# Patient Record
Sex: Female | Born: 1935 | Race: White | Hispanic: No | Marital: Single | Smoking: Former smoker
Health system: Southern US, Community
[De-identification: ages and names within clinical notes are randomized; demographics above are authoritative.]

## PROBLEM LIST (undated history)

## (undated) DIAGNOSIS — H5440 Blindness, one eye, unspecified eye: Secondary | ICD-10-CM

## (undated) DIAGNOSIS — I10 Essential (primary) hypertension: Secondary | ICD-10-CM

## (undated) DIAGNOSIS — E7800 Pure hypercholesterolemia, unspecified: Secondary | ICD-10-CM

## (undated) DIAGNOSIS — I4891 Unspecified atrial fibrillation: Secondary | ICD-10-CM

## (undated) DIAGNOSIS — F329 Major depressive disorder, single episode, unspecified: Secondary | ICD-10-CM

## (undated) DIAGNOSIS — I639 Cerebral infarction, unspecified: Secondary | ICD-10-CM

## (undated) DIAGNOSIS — L0390 Cellulitis, unspecified: Secondary | ICD-10-CM

## (undated) DIAGNOSIS — I509 Heart failure, unspecified: Secondary | ICD-10-CM

## (undated) DIAGNOSIS — F0390 Unspecified dementia without behavioral disturbance: Secondary | ICD-10-CM

## (undated) DIAGNOSIS — I499 Cardiac arrhythmia, unspecified: Secondary | ICD-10-CM

## (undated) DIAGNOSIS — F32A Depression, unspecified: Secondary | ICD-10-CM

## (undated) DIAGNOSIS — I4901 Ventricular fibrillation: Secondary | ICD-10-CM

## (undated) DIAGNOSIS — H409 Unspecified glaucoma: Secondary | ICD-10-CM

## (undated) DIAGNOSIS — J189 Pneumonia, unspecified organism: Secondary | ICD-10-CM

## (undated) HISTORY — DX: Unspecified atrial fibrillation: I48.91

---
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
## 2005-09-15 ENCOUNTER — Ambulatory Visit (HOSPITAL_COMMUNITY): Admission: RE | Admit: 2005-09-15 | Discharge: 2005-09-15 | Payer: Self-pay | Admitting: Radiology

## 2005-10-10 ENCOUNTER — Encounter: Admission: RE | Admit: 2005-10-10 | Discharge: 2005-10-10 | Payer: Self-pay | Admitting: Neurosurgery

## 2005-11-20 ENCOUNTER — Ambulatory Visit (HOSPITAL_COMMUNITY): Admission: RE | Admit: 2005-11-20 | Discharge: 2005-11-20 | Payer: Self-pay | Admitting: Neurosurgery

## 2010-06-09 ENCOUNTER — Encounter: Payer: Self-pay | Admitting: Neurosurgery

## 2010-07-30 ENCOUNTER — Ambulatory Visit (HOSPITAL_COMMUNITY)
Admission: RE | Admit: 2010-07-30 | Discharge: 2010-07-30 | Disposition: A | Discharge status: Home / Self Care | Payer: Medicare Other | Source: Ambulatory Visit | Attending: Ophthalmology | Admitting: Ophthalmology

## 2010-07-30 DIAGNOSIS — H349 Unspecified retinal vascular occlusion: Secondary | ICD-10-CM

## 2010-07-30 DIAGNOSIS — I10 Essential (primary) hypertension: Secondary | ICD-10-CM | POA: Insufficient documentation

## 2010-07-30 DIAGNOSIS — I059 Rheumatic mitral valve disease, unspecified: Secondary | ICD-10-CM | POA: Insufficient documentation

## 2010-07-30 DIAGNOSIS — H34239 Retinal artery branch occlusion, unspecified eye: Secondary | ICD-10-CM | POA: Insufficient documentation

## 2012-03-22 ENCOUNTER — Emergency Department (HOSPITAL_COMMUNITY): Payer: Medicare Other

## 2012-03-22 ENCOUNTER — Encounter (HOSPITAL_COMMUNITY): Payer: Self-pay | Admitting: *Deleted

## 2012-03-22 ENCOUNTER — Observation Stay (HOSPITAL_COMMUNITY)
Admission: EM | Admit: 2012-03-22 | Discharge: 2012-03-25 | Disposition: A | Discharge status: Home / Self Care | Payer: Medicare Other | Attending: Internal Medicine | Admitting: Internal Medicine

## 2012-03-22 DIAGNOSIS — I059 Rheumatic mitral valve disease, unspecified: Secondary | ICD-10-CM | POA: Insufficient documentation

## 2012-03-22 DIAGNOSIS — Z23 Encounter for immunization: Secondary | ICD-10-CM | POA: Insufficient documentation

## 2012-03-22 DIAGNOSIS — Z7982 Long term (current) use of aspirin: Secondary | ICD-10-CM | POA: Insufficient documentation

## 2012-03-22 DIAGNOSIS — I10 Essential (primary) hypertension: Secondary | ICD-10-CM

## 2012-03-22 DIAGNOSIS — Z7902 Long term (current) use of antithrombotics/antiplatelets: Secondary | ICD-10-CM | POA: Insufficient documentation

## 2012-03-22 DIAGNOSIS — M7989 Other specified soft tissue disorders: Secondary | ICD-10-CM | POA: Insufficient documentation

## 2012-03-22 DIAGNOSIS — R0902 Hypoxemia: Secondary | ICD-10-CM

## 2012-03-22 DIAGNOSIS — H409 Unspecified glaucoma: Secondary | ICD-10-CM

## 2012-03-22 DIAGNOSIS — I5033 Acute on chronic diastolic (congestive) heart failure: Principal | ICD-10-CM | POA: Insufficient documentation

## 2012-03-22 DIAGNOSIS — I509 Heart failure, unspecified: Secondary | ICD-10-CM | POA: Diagnosis present

## 2012-03-22 DIAGNOSIS — I4891 Unspecified atrial fibrillation: Secondary | ICD-10-CM

## 2012-03-22 DIAGNOSIS — Z792 Long term (current) use of antibiotics: Secondary | ICD-10-CM | POA: Insufficient documentation

## 2012-03-22 DIAGNOSIS — Z79899 Other long term (current) drug therapy: Secondary | ICD-10-CM | POA: Insufficient documentation

## 2012-03-22 HISTORY — DX: Essential (primary) hypertension: I10

## 2012-03-22 HISTORY — DX: Unspecified glaucoma: H40.9

## 2012-03-22 HISTORY — DX: Cardiac arrhythmia, unspecified: I49.9

## 2012-03-22 HISTORY — DX: Heart failure, unspecified: I50.9

## 2012-03-22 LAB — PRO B NATRIURETIC PEPTIDE: Pro B Natriuretic peptide (BNP): 2124 pg/mL — ABNORMAL HIGH (ref 0–450)

## 2012-03-22 LAB — CBC
Hemoglobin: 14.3 g/dL (ref 12.0–15.0)
MCH: 30.7 pg (ref 26.0–34.0)
Platelets: 243 10*3/uL (ref 150–400)
RDW: 14.4 % (ref 11.5–15.5)

## 2012-03-22 LAB — BASIC METABOLIC PANEL
CO2: 30 mEq/L (ref 19–32)
Calcium: 9.4 mg/dL (ref 8.4–10.5)
Chloride: 104 mEq/L (ref 96–112)

## 2012-03-22 LAB — TROPONIN I: Troponin I: <0.3 ng/mL (ref <0.30)

## 2012-03-22 MED ORDER — FUROSEMIDE 10 MG/ML IJ SOLN
20.0000 mg | Freq: Once | INTRAMUSCULAR | Status: AC
  Administered 2012-03-22: 20 mg via INTRAVENOUS
  Filled 2012-03-22: qty 2

## 2012-03-22 MED ORDER — DILTIAZEM HCL 30 MG PO TABS
30.0000 mg | ORAL_TABLET | Freq: Once | ORAL | Status: AC
  Administered 2012-03-23: 30 mg via ORAL
  Filled 2012-03-22: qty 1

## 2012-03-22 NOTE — ED Notes (Signed)
Pt wheeled to xray.

## 2012-03-22 NOTE — ED Provider Notes (Signed)
History     CSN: [id number]  Arrival date & time 03/22/12  1749   First MD Initiated Contact with Patient 03/22/12 1836      Chief Complaint  Patient presents with  . Shortness of Breath  . Leg Swelling    (Consider location/radiation/quality/duration/timing/severity/associated sxs/prior treatment) HPI Comments: Sydney Hensley is a 78 y.o. Female who has been ill for 2 weeks with cough and shortness of breath. Her doctor prescribed doxycycline. She took it but did not get better. She also has bilateral lower extremity swelling. She went to her Dr. today, and was discovered to be in atrial fibrillation, a new rhythm. She was sent here for evaluation. She denies chest pain, weakness, dizziness, nausea, and vomiting. She gets more out of breath with walking. Her breathing improves with sitting and rest. She's not taking any other medicine for the problem.  Patient is a 78 y.o. female presenting with shortness of breath. The history is provided by the patient and a relative.  Shortness of Breath  Associated symptoms include shortness of breath.    Past Medical History  Diagnosis Date  . Glaucoma     Past Surgical History  Procedure Date  . Abdominal hysterectomy     No family history on file.  History  Substance Use Topics  . Smoking status: Former Games developer  . Smokeless tobacco: Not on file  . Alcohol Use: No    OB History    Grav Para Term Preterm Abortions TAB SAB Ect Mult Living                  Review of Systems  Respiratory: Positive for shortness of breath.   All other systems reviewed and are negative.    Allergies  Review of patient's allergies indicates no known allergies.  Home Medications   Current Outpatient Rx  Name  Route  Sig  Dispense  Refill  . ASPIRIN 81 MG PO CHEW   Oral   Chew 81 mg by mouth daily.         . CHOLECALCIFEROL 2000 UNITS PO TABS   Oral   Take 1 tablet by mouth daily.         Marland Kitchen DOXYCYCLINE HYCLATE 100 MG PO CPEP  Oral   Take 100 mg by mouth 2 (two) times daily. Take for 7 days starting 03/16/12         . LISINOPRIL 5 MG PO TABS   Oral   Take 5 mg by mouth daily.         Marland Kitchen PRAVASTATIN SODIUM 20 MG PO TABS   Oral   Take 20 mg by mouth daily.         Marland Kitchen VITAMIN B-12 500 MCG PO TABS   Oral   Take 500 mcg by mouth daily.           BP 122/44  Pulse 87  Temp 98.3 F (36.8 C) (Oral)  Resp 22  SpO2 100%  Physical Exam  Nursing note and vitals reviewed. Constitutional: She is oriented to person, place, and time. She appears well-developed and well-nourished.  HENT:  Head: Normocephalic and atraumatic.  Eyes: Conjunctivae normal and EOM are normal. Pupils are equal, round, and reactive to light.  Neck: Normal range of motion and phonation normal. Neck supple.  Cardiovascular: Normal rate, regular rhythm and intact distal pulses.   Pulmonary/Chest: Effort normal and breath sounds normal. She has no wheezes. She has no rales. She exhibits no tenderness.  Abdominal: Soft.  She exhibits no distension. There is no tenderness. There is no guarding.  Musculoskeletal: Normal range of motion. She exhibits edema (3+ , bilateral legs).  Neurological: She is alert and oriented to person, place, and time. She has normal strength. She exhibits normal muscle tone.  Skin: Skin is warm and dry.  Psychiatric: She has a normal mood and affect. Her behavior is normal. Judgment and thought content normal.    ED Course  Procedures (including critical care time)  Emergency department treatment: IV Lasix for congestive heart failure. Oxygen by nasal cannula at 2 L to support oxygen deficit. O2 saturation improved to 100%  Case discussed with cardiology, Dr. Tresa Endo, who deferred admission to hospitalist.  21:40- patient walked to the bathroom x2. At this time. I return from the bathroom. Her heart rate was 123, and she was dyspneic; Oxygen saturation 100%, on nasal cannula oxygen. Her heart rate quickly  improved to less than 100 when she laid on the stretcher. Currently awaiting arrival of hospitalist to evaluate for admission.     CRITICAL CARE Performed by: Flint Melter   Total critical care time: 35 min  Critical care time was exclusive of separately billable procedures and treating other patients.  Critical care was necessary to treat or prevent imminent or life-threatening deterioration.  Critical care was time spent personally by me on the following activities: development of treatment plan with patient and/or surrogate as well as nursing, discussions with consultants, evaluation of patient's response to treatment, examination of patient, obtaining history from patient or surrogate, ordering and performing treatments and interventions, ordering and review of laboratory studies, ordering and review of radiographic studies, pulse oximetry and re-evaluation of patient's condition.  Labs Reviewed  PRO B NATRIURETIC PEPTIDE - Abnormal; Notable for the following:    Pro B Natriuretic peptide (BNP) 2124.0 (*)     All other components within normal limits  BASIC METABOLIC PANEL - Abnormal; Notable for the following:    Glucose, Bld 107 (*)     GFR calc non Af Amer 67 (*)     GFR calc Af Amer 77 (*)     All other components within normal limits  CBC  TROPONIN I   Dg Chest 2 View  03/22/2012  *RADIOLOGY REPORT*  Clinical Data: Shortness of breath  CHEST - 2 VIEW  Comparison: None.  Findings: There are small bilateral pleural effusions with consolidation of bilateral lung bases.  There is a small opacity in the right upper lobe, early developing pneumonia is possible.The mediastinal contour is normal.  Evaluation of heart size is limited due to loss of heart border.  There is minimal degenerative joint changes of the spine.  The soft tissues are otherwise normal.  IMPRESSION: Small bilateral pleural effusions with consolidation of bilateral lung bases at least in part due to atelectasis  but underlying pneumonia is not excluded.  There is a small opacity in the right upper lobe, early developing pneumonia is possible.   Original Report Authenticated By: Sherian Rein, M.D.      1. Congestive heart failure   2. Atrial fibrillation   3. Hypoxemia       MDM  New onset atrial fibrillation with congestive heart failure, mild. She has associated hypoxia, dyspnea on exertion, and marked leg edema. She is elderly and frail.     Consult [facility], who arranged admission for the patient       Flint Melter, MD 03/23/12 [phone]

## 2012-03-22 NOTE — ED Notes (Signed)
All triage by snewsome not darryl

## 2012-03-22 NOTE — ED Notes (Signed)
Pt returned from xray

## 2012-03-22 NOTE — ED Notes (Signed)
Pt sent from Dr. Waynard Edwards office with one week of sob, cough and was put on doxycycline.  COugh got better, but sob worse.  Pt has LE swelling.  Denies sob.  New onset afib with rate control in 80s and possible new chf.  MD perfers [facility] for cards consult since they saw her one year ago.

## 2012-03-22 NOTE — H&P (Addendum)
PCP:   Ezequiel Kayser, MD   Chief Complaint:  Shortness of breath and no extremity swelling  HPI: This is a pleasant 78 year old female who lives alone. She stated for approximately last 10 days and she's had a cold and a cough. She states the cough is nonproductive, she denies any wheezing, fevers, chills, nausea, vomiting or diarrhea. Over the past few days she's noted swelling on her bilateral lower extremities. She denies any chest pains, palpitation or lightheadedness. Her shortness of breath has become progressively worse and today she could hardly breathe. She called her PCP who sent her to the ER. Early in the week she had called her PCP and had antibiotics prescribed. She is taking 6 of the 7 days and feels no better. History provided the patient is alert and oriented.  Review of Systems:  The patient denies anorexia, fever, weight loss,, vision loss, decreased hearing, hoarseness, chest pain, syncope, balance deficits, hemoptysis, abdominal pain, melena, hematochezia, severe indigestion/heartburn, hematuria, incontinence, genital sores, muscle weakness, suspicious skin lesions, transient blindness, difficulty walking, depression, unusual weight change, abnormal bleeding, enlarged lymph nodes, angioedema, and breast masses.  Past Medical History: Past Medical History  Diagnosis Date  . Glaucoma    Past Surgical History  Procedure Date  . Abdominal hysterectomy     Medications: Prior to Admission medications   Medication Sig Start Date End Date Taking? Authorizing Provider  aspirin 81 MG chewable tablet Chew 81 mg by mouth daily.   Yes Historical Provider, MD  Cholecalciferol (VITAMIN D3 SUPER STRENGTH) 2000 UNITS TABS Take 1 tablet by mouth daily.   Yes Historical Provider, MD  doxycycline (DORYX) 100 MG DR capsule Take 100 mg by mouth 2 (two) times daily. Take for 7 days starting 03/16/12   Yes Historical Provider, MD  lisinopril (PRINIVIL,ZESTRIL) 5 MG tablet Take 5 mg by mouth  daily.   Yes Historical Provider, MD  pravastatin (PRAVACHOL) 20 MG tablet Take 20 mg by mouth daily.   Yes Historical Provider, MD  vitamin B-12 (CYANOCOBALAMIN) 500 MCG tablet Take 500 mcg by mouth daily.   Yes Historical Provider, MD    Allergies:  No Known Allergies  Social History:  reports that she has quit smoking. She does not have any smokeless tobacco history on file. She reports that she does not drink alcohol or use illicit drugs. lives alone, doesn't use a cane or walker.  Family History: Family History  Problem Relation Age of Onset  . CVA      Physical Exam: Filed Vitals:   03/22/12 1837 03/22/12 1937 03/22/12 2100 03/22/12 2259  BP:   155/96 166/103  Pulse:   79   Temp:      TempSrc:      Resp:   19 18  SpO2: 98% 100% 100% 98%    General:  Alert and oriented times three, well developed and nourished, no acute distress Eyes: PERRLA, pink conjunctiva, no scleral icterus ENT: Moist oral mucosa, neck supple, no thyromegaly Lungs: clear to ascultation, no wheeze, no crackles, no use of accessory muscles Cardiovascular: irregular rate and rhythm, no regurgitation, no gallops, no murmurs. No carotid bruits, no JVD Abdomen: soft, positive BS, non-tender, non-distended, no organomegaly, not an acute abdomen GU: not examined Neuro: CN II - XII grossly intact, sensation intact Musculoskeletal: strength 5/5 all extremities, no clubbing, cyanosis or 3+ bilateral lower extremity pitting edema Skin: no rash, no subcutaneous crepitation, no decubitus Psych: appropriate patient   Labs on Admission:   [facility] 03/22/12 1817  NA 143  K 4.1  CL 104  CO2 30  GLUCOSE 107*  BUN 19  CREATININE 0.80  CALCIUM 9.4  MG --  PHOS --   No results found for this basename: AST:2,ALT:2,ALKPHOS:2,BILITOT:2,PROT:2,ALBUMIN:2 in the last 72 hours No results found for this basename: LIPASE:2,AMYLASE:2 in the last 72 hours  Basename 03/22/12 1817  WBC 8.8  NEUTROABS --  HGB 14.3    HCT 42.7  MCV 91.6  PLT 243    Basename 03/22/12 1817  CKTOTAL --  CKMB --  CKMBINDEX --  TROPONINI <0.30   No components found with this basename: POCBNP:3 No results found for this basename: DDIMER:2 in the last 72 hours No results found for this basename: HGBA1C:2 in the last 72 hours No results found for this basename: CHOL:2,HDL:2,LDLCALC:2,TRIG:2,CHOLHDL:2,LDLDIRECT:2 in the last 72 hours No results found for this basename: TSH,T4TOTAL,FREET3,T3FREE,THYROIDAB in the last 72 hours No results found for this basename: VITAMINB12:2,FOLATE:2,FERRITIN:2,TIBC:2,IRON:2,RETICCTPCT:2 in the last 72 hours  Micro Results: No results found for this or any previous visit (from the past 240 hour(s)).   Radiological Exams on Admission: Dg Chest 2 View  03/22/2012  *RADIOLOGY REPORT*  Clinical Data: Shortness of breath  CHEST - 2 VIEW  Comparison: None.  Findings: There are small bilateral pleural effusions with consolidation of bilateral lung bases.  There is a small opacity in the right upper lobe, early developing pneumonia is possible.The mediastinal contour is normal.  Evaluation of heart size is limited due to loss of heart border.  There is minimal degenerative joint changes of the spine.  The soft tissues are otherwise normal.  IMPRESSION: Small bilateral pleural effusions with consolidation of bilateral lung bases at least in part due to atelectasis but underlying pneumonia is not excluded.  There is a small opacity in the right upper lobe, early developing pneumonia is possible.   Original Report Authenticated By: Sherian Rein, M.D.    EKG: Atrial relation  Assessment/Plan Present on Admission:  . Congestive heart failure Atrial fibrillation Admit to telemetry for observation TSH, 2-D echo in a.m. Lasix IV every 12, [facility] Coumadin pharmacy to manage Oxygen ketones greater than 88%   Full code DVT prophylaxis   Sydney Hensley 03/22/2012, 11:32 PM

## 2012-03-23 ENCOUNTER — Encounter (HOSPITAL_COMMUNITY): Payer: Self-pay

## 2012-03-23 DIAGNOSIS — I10 Essential (primary) hypertension: Secondary | ICD-10-CM | POA: Diagnosis present

## 2012-03-23 LAB — URINALYSIS, ROUTINE W REFLEX MICROSCOPIC
Glucose, UA: NEGATIVE mg/dL
Hgb urine dipstick: NEGATIVE
Ketones, ur: NEGATIVE mg/dL
Protein, ur: NEGATIVE mg/dL
pH: 7.5 (ref 5.0–8.0)

## 2012-03-23 LAB — BASIC METABOLIC PANEL
CO2: 28 mEq/L (ref 19–32)
Calcium: 9 mg/dL (ref 8.4–10.5)
Chloride: 105 mEq/L (ref 96–112)
Glucose, Bld: 87 mg/dL (ref 70–99)
Sodium: 145 mEq/L (ref 135–145)

## 2012-03-23 LAB — CBC
Hemoglobin: 13.6 g/dL (ref 12.0–15.0)
MCH: 30.3 pg (ref 26.0–34.0)
MCV: 92.4 fL (ref 78.0–100.0)
RBC: 4.49 MIL/uL (ref 3.87–5.11)
WBC: 7.6 10*3/uL (ref 4.0–10.5)

## 2012-03-23 LAB — PROTIME-INR: INR: 0.97 (ref 0.00–1.49)

## 2012-03-23 MED ORDER — WARFARIN - PHARMACIST DOSING INPATIENT
Freq: Every day | Status: DC
  Administered 2012-03-23: 18:00:00

## 2012-03-23 MED ORDER — METOPROLOL TARTRATE 1 MG/ML IV SOLN: 5.0000 mg | Freq: Four times a day (QID) | INTRAVENOUS | Status: DC | PRN

## 2012-03-23 MED ORDER — SODIUM CHLORIDE 0.9 % IJ SOLN: 3.0000 mL | INTRAMUSCULAR | Status: DC | PRN

## 2012-03-23 MED ORDER — SODIUM CHLORIDE 0.9 % IJ SOLN
3.0000 mL | Freq: Two times a day (BID) | INTRAMUSCULAR | Status: DC
  Administered 2012-03-23 – 2012-03-25 (×6): 3 mL via INTRAVENOUS

## 2012-03-23 MED ORDER — COUMADIN BOOK
1.0000 | Freq: Once | Status: AC
  Administered 2012-03-23: 1
  Filled 2012-03-23 (×2): qty 1

## 2012-03-23 MED ORDER — PNEUMOCOCCAL VAC POLYVALENT 25 MCG/0.5ML IJ INJ
0.5000 mL | INJECTION | INTRAMUSCULAR | Status: AC
  Administered 2012-03-24: 0.5 mL via INTRAMUSCULAR
  Filled 2012-03-23: qty 0.5

## 2012-03-23 MED ORDER — INFLUENZA VIRUS VACC SPLIT PF IM SUSP
0.5000 mL | INTRAMUSCULAR | Status: AC
  Administered 2012-03-24: 0.5 mL via INTRAMUSCULAR
  Filled 2012-03-23: qty 0.5

## 2012-03-23 MED ORDER — ONDANSETRON HCL 4 MG/2ML IJ SOLN: 4.0000 mg | Freq: Four times a day (QID) | INTRAMUSCULAR | Status: DC | PRN

## 2012-03-23 MED ORDER — SENNOSIDES-DOCUSATE SODIUM 8.6-50 MG PO TABS
1.0000 | ORAL_TABLET | Freq: Every evening | ORAL | Status: DC | PRN
  Filled 2012-03-23: qty 1

## 2012-03-23 MED ORDER — HYDROCODONE-ACETAMINOPHEN 5-325 MG PO TABS: 1.0000 | ORAL_TABLET | ORAL | Status: DC | PRN

## 2012-03-23 MED ORDER — ENOXAPARIN SODIUM 60 MG/0.6ML ~~LOC~~ SOLN
1.0000 mg/kg | Freq: Two times a day (BID) | SUBCUTANEOUS | Status: DC
  Administered 2012-03-23 – 2012-03-24 (×3): 60 mg via SUBCUTANEOUS
  Filled 2012-03-23 (×5): qty 0.6

## 2012-03-23 MED ORDER — WARFARIN SODIUM 5 MG PO TABS
5.0000 mg | ORAL_TABLET | Freq: Once | ORAL | Status: AC
  Administered 2012-03-23: 5 mg via ORAL
  Filled 2012-03-23: qty 1

## 2012-03-23 MED ORDER — LISINOPRIL 5 MG PO TABS
5.0000 mg | ORAL_TABLET | Freq: Every day | ORAL | Status: DC
  Administered 2012-03-23 – 2012-03-25 (×3): 5 mg via ORAL
  Filled 2012-03-23 (×3): qty 1

## 2012-03-23 MED ORDER — WARFARIN VIDEO
1.0000 | Freq: Once | Status: AC
  Administered 2012-03-23: 1

## 2012-03-23 MED ORDER — FUROSEMIDE 10 MG/ML IJ SOLN
40.0000 mg | Freq: Two times a day (BID) | INTRAMUSCULAR | Status: DC
  Administered 2012-03-23 – 2012-03-25 (×6): 40 mg via INTRAVENOUS
  Filled 2012-03-23 (×8): qty 4

## 2012-03-23 MED ORDER — DILTIAZEM HCL 30 MG PO TABS
30.0000 mg | ORAL_TABLET | Freq: Four times a day (QID) | ORAL | Status: DC
  Administered 2012-03-23 – 2012-03-24 (×5): 30 mg via ORAL
  Filled 2012-03-23 (×9): qty 1

## 2012-03-23 MED ORDER — ENOXAPARIN SODIUM 40 MG/0.4ML ~~LOC~~ SOLN: 40.0000 mg | SUBCUTANEOUS | Status: DC

## 2012-03-23 MED ORDER — ONDANSETRON HCL 4 MG PO TABS: 4.0000 mg | ORAL_TABLET | Freq: Four times a day (QID) | ORAL | Status: DC | PRN

## 2012-03-23 MED ORDER — ACETAMINOPHEN 325 MG PO TABS: 650.0000 mg | ORAL_TABLET | Freq: Four times a day (QID) | ORAL | Status: DC | PRN

## 2012-03-23 MED ORDER — ASPIRIN 81 MG PO CHEW
81.0000 mg | CHEWABLE_TABLET | Freq: Every day | ORAL | Status: DC
  Administered 2012-03-23: 81 mg via ORAL
  Filled 2012-03-23: qty 1

## 2012-03-23 MED ORDER — ACETAMINOPHEN 650 MG RE SUPP: 650.0000 mg | Freq: Four times a day (QID) | RECTAL | Status: DC | PRN

## 2012-03-23 MED ORDER — POTASSIUM CHLORIDE CRYS ER 20 MEQ PO TBCR
40.0000 meq | EXTENDED_RELEASE_TABLET | Freq: Once | ORAL | Status: AC
  Administered 2012-03-23: 40 meq via ORAL
  Filled 2012-03-23: qty 2

## 2012-03-23 NOTE — Progress Notes (Signed)
Utilization Review Completed.   Korey Arroyo, RN, BSN Nurse Case Manager  [phone]  

## 2012-03-23 NOTE — Progress Notes (Signed)
  Echocardiogram 2D Echocardiogram has been performed.  Georgian Co 03/23/2012, 4:32 PM

## 2012-03-23 NOTE — Progress Notes (Signed)
ANTICOAGULATION CONSULT NOTE - Initial Consult  Pharmacy Consult for Coumadin Indication: atrial fibrillation  No Known Allergies  Patient Measurements: Height: 5\' 6"  (167.6 cm) Weight: 136 lb 11.2 oz (62.007 kg) (c scale) IBW/kg (Calculated) : 59.3   Vital Signs: Temp: 97.5 F (36.4 C) (11/05 0024) Temp src: Oral (11/05 0024) BP: 169/96 mmHg (11/05 0024) Pulse Rate: 110  (11/05 0024)  Labs:  Basename 03/23/12 0302 03/22/12 1817  HGB 13.6 14.3  HCT 41.5 42.7  PLT 222 243  APTT -- --  LABPROT -- --  INR -- --  HEPARINUNFRC -- --  CREATININE -- 0.80  CKTOTAL -- --  CKMB -- --  TROPONINI -- <0.30    Estimated Creatinine Clearance: 50.8 ml/min (by C-G formula based on Cr of 0.8).   Medical History: Past Medical History  Diagnosis Date  . Glaucoma   . Hypertension   . Dysrhythmia   . Shortness of breath   . CHF (congestive heart failure)     Medications:  Scheduled:    . aspirin  81 mg Oral Daily  . diltiazem  30 mg Oral Q6H  . [COMPLETED] diltiazem  30 mg Oral Once  . enoxaparin (LOVENOX) injection  40 mg Subcutaneous Q24H  . [COMPLETED] furosemide  20 mg Intravenous Once  . furosemide  40 mg Intravenous Q12H  . lisinopril  5 mg Oral Daily  . sodium chloride  3 mL Intravenous Q12H   Assessment: 78 yo female admitted with new-onset atrial fibrillation. Pharmacy consulted to manage Coumadin. Patient is also to receive full-dose therapeutic Lovenox per physician order.   Goal of Therapy:  INR 2-3 Monitor platelets by anticoagulation protocol: Yes   Plan:  1. Coumadin 5mg  po today at 18:00. 2. Daily PT / INR 3. Coumadin book / video.  4. Lovenox 60mg  SQ Q12H per physician order.   Emeline Gins 03/23/2012,3:24 AM

## 2012-03-23 NOTE — Progress Notes (Signed)
Pt with 2.2 second pause pt asymtomatic placed a call to Dr. Starr Sinclair to relay

## 2012-03-23 NOTE — Progress Notes (Addendum)
Monitor tech notified RN that pt had 5 beat run of V-tach. Pt asymptomatic. BP 141/83, HR 83. MD notified via [facility] text page. Will continue to monitor and assess. Jobe Igo A RN, 03/23/2012 11:05 PM

## 2012-03-23 NOTE — Progress Notes (Signed)
Pt noted having a soft raised area to right upper back 9.5cm x 7.2cm. Pt stated she didn't know anything about it being back there. Also pt has voiced concerns about being a DNR.

## 2012-03-23 NOTE — Progress Notes (Signed)
[facility] PROGRESS NOTE  Sydney Hensley NWG:[id number] DOB: [birth year] DOA: 03/22/2012 PCP: Ezequiel Kayser, MD  Assessment/Plan: Congestive heart failure -Improving,continue diuresis with iv lasix for now, will change to by mouth when appropriate  -Await 2-D echo Atrial fibrillation  -rate controlled on oral Cardizem -on Coumadin/lovenox per pharmacy, INR subtherapeutic Hypertension -Continue Cardizem and lisinopril, follow and adjust as appropriate. Hypokalemia -Replace potassium  Code Status: Full code Family Communication: The nephew at bedside Disposition Plan: to home when medically ready   Consultants:  None  Procedures:  Echo-pending  Antibiotics:  None  HPI/Subjective: States breathing much better today, denies chest pain.  Objective: Filed Vitals:   03/22/12 2100 03/22/12 2259 03/23/12 0024 03/23/12 1142  BP: 155/96 166/103 169/96 151/70  Pulse: 79  110   Temp:   97.5 F (36.4 C)   TempSrc:   Oral   Resp: 19 18 20    Height:   5\' 6"  (1.676 m)   Weight:   62.007 kg (136 lb 11.2 oz)   SpO2: 100% 98% 99%     Intake/Output Summary (Last 24 hours) at 03/23/12 1200 Last data filed at 03/23/12 1144  Gross per 24 hour  Intake    243 ml  Output   4000 ml  Net  -3757 ml   Filed Weights   03/23/12 0024  Weight: 62.007 kg (136 lb 11.2 oz)    Exam:   General: Alert and appropriate, in no apparent distress.   Cardiovascular: Irregular, normal S1-S2, rate controlled.  Respiratory: Decreased breath sounds at the bases, no crackles or wheezes  Abdomen: Soft, bowel sounds present nontender nondistended no organomegaly and no masses palpable  Extremities: Decreased edema, no cyanosis.  Data Reviewed: Basic Metabolic Panel:  Lab 03/23/12 5784 03/22/12 1817  NA 145 143  K 3.4* 4.1  CL 105 104  CO2 28 30  GLUCOSE 87 107*  BUN 17 19  CREATININE 0.75 0.80  CALCIUM 9.0 9.4  MG -- --  PHOS -- --   Liver Function Tests: No results found  for this basename: AST:5,ALT:5,ALKPHOS:5,BILITOT:5,PROT:5,ALBUMIN:5 in the last 168 hours No results found for this basename: LIPASE:5,AMYLASE:5 in the last 168 hours No results found for this basename: AMMONIA:5 in the last 168 hours CBC:  Lab 03/23/12 0302 03/22/12 1817  WBC 7.6 8.8  NEUTROABS -- --  HGB 13.6 14.3  HCT 41.5 42.7  MCV 92.4 91.6  PLT 222 243   Cardiac Enzymes:  Lab 03/22/12 1817  CKTOTAL --  CKMB --  CKMBINDEX --  TROPONINI <0.30   BNP (last 3 results)  Basename 03/23/12 0302 03/22/12 1817  PROBNP 2337.0* 2124.0*   CBG: No results found for this basename: GLUCAP:5 in the last 168 hours  No results found for this or any previous visit (from the past 240 hour(s)).   Studies: Dg Chest 2 View  03/22/2012  *RADIOLOGY REPORT*  Clinical Data: Shortness of breath  CHEST - 2 VIEW  Comparison: None.  Findings: There are small bilateral pleural effusions with consolidation of bilateral lung bases.  There is a small opacity in the right upper lobe, early developing pneumonia is possible.The mediastinal contour is normal.  Evaluation of heart size is limited due to loss of heart border.  There is minimal degenerative joint changes of the spine.  The soft tissues are otherwise normal.  IMPRESSION: Small bilateral pleural effusions with consolidation of bilateral lung bases at least in part due to atelectasis but underlying pneumonia is not excluded.  There is  a small opacity in the right upper lobe, early developing pneumonia is possible.   Original Report Authenticated By: Sherian Rein, M.D.     Scheduled Meds:   . aspirin  81 mg Oral Daily  . coumadin book  1 each Does not apply Once  . diltiazem  30 mg Oral Q6H  . [COMPLETED] diltiazem  30 mg Oral Once  . enoxaparin (LOVENOX) injection  1 mg/kg Subcutaneous Q12H  . [COMPLETED] furosemide  20 mg Intravenous Once  . furosemide  40 mg Intravenous Q12H  . influenza  inactive virus vaccine  0.5 mL Intramuscular  Tomorrow-1000  . lisinopril  5 mg Oral Daily  . pneumococcal 23 valent vaccine  0.5 mL Intramuscular Tomorrow-1000  . sodium chloride  3 mL Intravenous Q12H  . warfarin  5 mg Oral ONCE-1800  . warfarin  1 each Does not apply Once  . Warfarin - Pharmacist Dosing Inpatient   Does not apply q1800  . [DISCONTINUED] enoxaparin (LOVENOX) injection  40 mg Subcutaneous Q24H   Continuous Infusions:   Active Problems:  Congestive heart failure  Atrial fibrillation  Glaucoma    Time spent:    [facility]  [facility] Pager [phone]. If 8PM-8AM, please contact night-coverage at [email or web address], password [facility] 03/23/2012, 12:00 PM  LOS: 1 day

## 2012-03-24 DIAGNOSIS — R0902 Hypoxemia: Secondary | ICD-10-CM

## 2012-03-24 LAB — BASIC METABOLIC PANEL
BUN: 18 mg/dL (ref 6–23)
Calcium: 9.5 mg/dL (ref 8.4–10.5)
Creatinine, Ser: 0.79 mg/dL (ref 0.50–1.10)
GFR calc non Af Amer: 75 mL/min — ABNORMAL LOW (ref >90)
Glucose, Bld: 87 mg/dL (ref 70–99)

## 2012-03-24 MED ORDER — ASPIRIN 81 MG PO CHEW
CHEWABLE_TABLET | ORAL | Status: AC
  Administered 2012-03-24: 12:00:00
  Filled 2012-03-24: qty 1

## 2012-03-24 MED ORDER — POTASSIUM CHLORIDE CRYS ER 20 MEQ PO TBCR
40.0000 meq | EXTENDED_RELEASE_TABLET | Freq: Once | ORAL | Status: AC
  Administered 2012-03-24: 40 meq via ORAL
  Filled 2012-03-24: qty 2

## 2012-03-24 MED ORDER — METOPROLOL TARTRATE 50 MG PO TABS
50.0000 mg | ORAL_TABLET | Freq: Two times a day (BID) | ORAL | Status: DC
  Administered 2012-03-24 – 2012-03-25 (×3): 50 mg via ORAL
  Filled 2012-03-24 (×4): qty 1

## 2012-03-24 MED ORDER — APIXABAN 5 MG PO TABS
2.5000 mg | ORAL_TABLET | Freq: Two times a day (BID) | ORAL | Status: DC
  Administered 2012-03-24 – 2012-03-25 (×2): 2.5 mg via ORAL
  Filled 2012-03-24 (×4): qty 0.5

## 2012-03-24 NOTE — Progress Notes (Signed)
Pt weight yesterday 11/6 using scale C was 60.002kg. Pt on IV lasix and put out in urine last night. This AM weight post-voiding, weighed twice on scale C is 55.4kg. Will continue to monitor and assess. Baron Hamper RN 03/24/2012 6:52 AM

## 2012-03-24 NOTE — Progress Notes (Signed)
Monitor tech notified RN that pt had 2.39 second pause in HR on the monitor. MD notified through [facility] text page. Will continue to monitor and assess. Baron Hamper RN 03/24/2012 2:51 AM

## 2012-03-24 NOTE — Progress Notes (Addendum)
Pt refuses to call nurse to go to the bathroom. RN noticed pt walking to bathroom holding gown. Pt appeared confused. Upon entering the room, pt was in the bathroom. Pt had incontinent episode of bowel on the floor in room. RN assisted pt back to bed and put in bed with bed alarm on. Pt alert and oriented x4. No other neuro symptoms. Pt re-instructed to call for help to the bathroom. Floor cleaned with orange wipes and housekeeping called. Will continue to monitor and assess. Jobe Igo A RN 03/24/2012

## 2012-03-24 NOTE — Plan of Care (Signed)
Problem: Phase I Progression Outcomes Goal: EF % per last Echo/documented,Core Reminder form on chart Outcome: Completed/Met Date Met:  03/24/12 Pt EF 50-55% per Echo on 03/23/12

## 2012-03-24 NOTE — Progress Notes (Signed)
[facility] PROGRESS NOTE  Sydney Hensley WUJ:[id number] DOB: [birth year] DOA: 03/22/2012 PCP: Sydney Kayser, MD  Assessment/Plan: Congestive heart failure -Improving,continue diuresis with iv lasix for now, will change to by mouth when appropriate  -Await 2-D echo to find out type.  Atrial fibrillation  -rate controlled on oral Metoprolol -start  Eliquis for full anticoagulation  Hypertension -Continue metoprolol and lisinopril, follow and adjust as appropriate. Hypokalemia -Replace potassium  Code Status: Full code Family Communication: The nephew at bedside Disposition Plan: to home when medically ready   Consultants:  None  Procedures:  Echo-pending  Antibiotics:  None  HPI/Subjective: States breathing much better today, denies chest pain.  Objective: Filed Vitals:   03/24/12 0001 03/24/12 0233 03/24/12 0535 03/24/12 0644  BP:  117/51 137/67   Pulse: 74 81 79   Temp:  97.7 F (36.5 C) 97.7 F (36.5 C)   TempSrc:  Oral Oral   Resp:  16 18   Height:      Weight:    55.4 kg (122 lb 2.2 oz)  SpO2:  97% 96%     Intake/Output Summary (Last 24 hours) at 03/24/12 0957 Last data filed at 03/24/12 0835  Gross per 24 hour  Intake    855 ml  Output   4150 ml  Net  -3295 ml   Filed Weights   03/23/12 0024 03/24/12 0644  Weight: 62.007 kg (136 lb 11.2 oz) 55.4 kg (122 lb 2.2 oz)    Exam:   General: Alert and appropriate, in no apparent distress.   Cardiovascular: Irregular, normal S1-S2, rate controlled.  Respiratory: Decreased breath sounds at the bases, no crackles or wheezes  Abdomen: Soft, bowel sounds present nontender nondistended no organomegaly and no masses palpable  Extremities: Decreased edema, no cyanosis.  Data Reviewed: Basic Metabolic Panel:  Lab 03/24/12 9562 03/23/12 0302 03/22/12 1817  NA 143 145 143  K 3.5 3.4* 4.1  CL 99 105 104  CO2 32 28 30  GLUCOSE 87 87 107*  BUN 18 17 19   CREATININE 0.79 0.75 0.80  CALCIUM 9.5  9.0 9.4  MG -- -- --  PHOS -- -- --   Liver Function Tests: No results found for this basename: AST:5,ALT:5,ALKPHOS:5,BILITOT:5,PROT:5,ALBUMIN:5 in the last 168 hours No results found for this basename: LIPASE:5,AMYLASE:5 in the last 168 hours No results found for this basename: AMMONIA:5 in the last 168 hours CBC:  Lab 03/23/12 0302 03/22/12 1817  WBC 7.6 8.8  NEUTROABS -- --  HGB 13.6 14.3  HCT 41.5 42.7  MCV 92.4 91.6  PLT 222 243   Cardiac Enzymes:  Lab 03/22/12 1817  CKTOTAL --  CKMB --  CKMBINDEX --  TROPONINI <0.30   BNP (last 3 results)  Basename 03/23/12 0302 03/22/12 1817  PROBNP 2337.0* 2124.0*   CBG: No results found for this basename: GLUCAP:5 in the last 168 hours  No results found for this or any previous visit (from the past 240 hour(s)).   Studies: Dg Chest 2 View  03/22/2012  *RADIOLOGY REPORT*  Clinical Data: Shortness of breath  CHEST - 2 VIEW  Comparison: None.  Findings: There are small bilateral pleural effusions with consolidation of bilateral lung bases.  There is a small opacity in the right upper lobe, early developing pneumonia is possible.The mediastinal contour is normal.  Evaluation of heart size is limited due to loss of heart border.  There is minimal degenerative joint changes of the spine.  The soft tissues are otherwise normal.  IMPRESSION:  Small bilateral pleural effusions with consolidation of bilateral lung bases at least in part due to atelectasis but underlying pneumonia is not excluded.  There is a small opacity in the right upper lobe, early developing pneumonia is possible.   Original Report Authenticated By: Sherian Rein, M.D.     Scheduled Meds:    . aspirin  81 mg Oral Daily  . [COMPLETED] coumadin book  1 each Does not apply Once  . diltiazem  30 mg Oral Q6H  . enoxaparin (LOVENOX) injection  1 mg/kg Subcutaneous Q12H  . furosemide  40 mg Intravenous Q12H  . influenza  inactive virus vaccine  0.5 mL Intramuscular  Tomorrow-1000  . lisinopril  5 mg Oral Daily  . pneumococcal 23 valent vaccine  0.5 mL Intramuscular Tomorrow-1000  . [COMPLETED] potassium chloride  40 mEq Oral Once  . potassium chloride  40 mEq Oral Once  . sodium chloride  3 mL Intravenous Q12H  . [COMPLETED] warfarin  5 mg Oral ONCE-1800  . [COMPLETED] warfarin  1 each Does not apply Once  . Warfarin - Pharmacist Dosing Inpatient   Does not apply q1800   Continuous Infusions:   Active Problems:  Congestive heart failure  Atrial fibrillation  Glaucoma  Hypertension    Time spent:    Sydney Hensley  [facility] Pager [phone]. If 8PM-8AM, please contact night-coverage at [email or web address], password [facility] 03/24/2012, 9:57 AM  LOS: 2 days

## 2012-03-24 NOTE — Evaluation (Signed)
Physical Therapy Evaluation Patient Details Name: Sydney Hensley MRN: [id number] DOB: 1927/10/15 Today's Date: 03/24/2012 Time: 4098-1191 PT Time Calculation (min): 24 min  PT Assessment / Plan / Recommendation Clinical Impression  Sydney Hensley is a pleasant 77 y/o female admitted for SOB and lower extremity swelling. Pt with CHF and ?PNA. Presents to PT today with generalized weakness and decreased activity tolerance affecting her balance and independence with all gait, transfers and mobility. Will benefit physical therapy in the acute setting to [facility] safety with mobility prior to d/c home.     PT Assessment  Patient needs continued PT services    Follow Up Recommendations  Home health PT;Supervision for mobility/OOB    Does the patient have the potential to tolerate intense rehabilitation      Barriers to Discharge        Equipment Recommendations  Rolling walker with 5" wheels;Tub/shower seat    Recommendations for Other Services     Frequency Min 3X/week    Precautions / Restrictions Precautions Precautions: Fall Restrictions Weight Bearing Restrictions: No   Pertinent Vitals/Pain DOE 1-2/4 with activity needing seated rest break to catch her breath in the middle of our evaluation      Mobility  Bed Mobility Bed Mobility: Not assessed Transfers Transfers: Sit to Stand;Stand to Sit (2 trials) Sit to Stand: 5: Supervision;With upper extremity assist;From chair/3-in-1 Stand to Sit: 5: Supervision;With upper extremity assist;To chair/3-in-1 Details for Transfer Assistance: Initial sit->stand pt with small LOB posteriorly using stepping reaction to correct however still appears gaurded and slightly unsteady, min tactile assist to stabilize; 2nd trial RW placed in front and pt cued for safe hand placement with regards to RW Ambulation/Gait Ambulation/Gait Assistance: 4: Min guard Ambulation Distance (Feet): 400 Feet Ambulation/Gait Assistance Details: Ambulated 100 ft  initially with no AD needing mingaurdA for safety and stabilization especially with more dynamic challenges and as she fatigued more, pt needing 5 minute seated rest break to catch her breath following this walk; after rest, pt ambulated with RW needing only supervision for ambulation with verbal cues for safe positioning within RW Gait Pattern: Antalgic;Decreased stance time - right;Trunk flexed Gait velocity: 1.91 ft/sec  General Gait Details: pt reports a "pulling" behind her right leg during gait and exhibits an antalgic type gait more pronounced with fatigue; improved stability noted with RW Stairs: No              PT Diagnosis: Abnormality of gait;Generalized weakness  PT Problem List: Decreased strength;Decreased activity tolerance;Decreased balance;Decreased mobility;Cardiopulmonary status limiting activity;Decreased knowledge of use of DME PT Treatment Interventions: DME instruction;Gait training;Stair training;Functional mobility training;Therapeutic activities;Therapeutic exercise;Balance training;Neuromuscular re-education;Patient/family education   PT Goals Acute Rehab PT Goals PT Goal Formulation: With patient Time For Goal Achievement: 03/31/12 Potential to Achieve Goals: Good Pt will go Sit to Stand: with modified independence PT Goal: Sit to Stand - Progress: Goal set today Pt will go Stand to Sit: with modified independence PT Goal: Stand to Sit - Progress: Goal set today Pt will Transfer Bed to Chair/Chair to Bed: with modified independence PT Transfer Goal: Bed to Chair/Chair to Bed - Progress: Goal set today Pt will Ambulate: >150 feet;with modified independence;with least restrictive assistive device PT Goal: Ambulate - Progress: Goal set today Pt will Go Up / Down Stairs: 1-2 stairs;with least restrictive assistive device;with supervision PT Goal: Up/Down Stairs - Progress: Goal set today  Visit Information  Last PT Received On: 03/24/12 Assistance Needed: +1     Subjective Data  Subjective: I am very independent but my family is very good about coming over to help me, even when I don't ask.  Patient Stated Goal: home   Prior Functioning  Home Living Lives With: Alone Available Help at Discharge: Family;Available 24 hours/day;Available PRN/intermittently Type of Home: House Home Access: Stairs to enter [employer] of Steps: 2 Entrance Stairs-Rails: Right;Left Home Layout: One level Bathroom Accessibility: Yes How Accessible: Accessible via walker Home Adaptive Equipment: Shower chair with back Prior Function Level of Independence: Independent Able to Take Stairs?: Yes Driving: Yes Vocation: Retired Musician: No difficulties    Cognition  Overall Cognitive Status: Appears within functional limits for tasks assessed/performed Arousal/Alertness: Awake/alert Orientation Level: Appears intact for tasks assessed Behavior During Session: [facility] for tasks performed    Extremity/Trunk Assessment Right Upper Extremity Assessment RUE ROM/Strength/Tone: [facility] for tasks assessed Left Upper Extremity Assessment LUE ROM/Strength/Tone: WFL for tasks assessed Right Lower Extremity Assessment RLE ROM/Strength/Tone: [facility] for tasks assessed Left Lower Extremity Assessment LLE ROM/Strength/Tone: WFL for tasks assessed   Balance Balance Balance Assessed: Yes Static Standing Balance Static Standing - Balance Support: No upper extremity supported Static Standing - Level of Assistance: 5: Stand by assistance Static Standing - Comment/# of Minutes: pt stood x2-3 minutes with approx 2-3 between feet for BOS, initially upon standing pt demonstrating posterior LOB needing closer gaurding for safety, stands cautiously High Level Balance High Level Balance Comments: sway increased with head turns needing increased gaurding however no LOB noted, able to perform a quick pivot turn however needing mingaurdA for safety; unable to perform  full DGI because of decreased activity tolerance  End of Session PT - End of Session Equipment Utilized During Treatment: Gait belt Activity Tolerance: Patient tolerated treatment well Patient left: in chair Nurse Communication: Mobility status  GP Functional Limitation: Mobility: Walking and moving around Mobility: Walking and Moving Around Current Status (V7846): At least 1 percent but less than 20 percent impaired, limited or restricted Mobility: Walking and Moving Around Goal Status [phone]): 0 percent impaired, limited or restricted   [facility] HELEN 03/24/2012, 2:55 PM

## 2012-03-24 NOTE — Progress Notes (Signed)
Monitor tech notified RN that pt had 2.24 second pause on monitor. Pt HR dropped to 36 non-sustaining. HR now in 60s-80s. Pt asymptomatic and sleeping. BP 117/51. Notified MD via [facility] text page. Will continue to monitor and assess. Jobe Igo A RN 03/24/2012 2:36 AM

## 2012-03-24 NOTE — Progress Notes (Signed)
Occupational Therapy Evaluation Patient Details Name: Sydney Hensley MRN: [id number] DOB: 24-Sep-1927 Today's Date: 03/24/2012 Time: 8119-1478 OT Time Calculation (min): 33 min  OT Assessment / Plan / Recommendation Clinical Impression  78 yo with recent c/o weakness and SOB. Pt with CHF and ?PNA. Dyspnea 2/4. PTA, pt lived alone and was independent, including driving. Feel pt will be able to D/C home with supervison of family. Rec HHOT/PT to return pt to PLOF. Pt will benefit from skilled Ot services to max independence with ADL and functional mobility for ADL to return to PLOF.    OT Assessment  Patient needs continued OT Services    Follow Up Recommendations  Home health OT    Barriers to Discharge None    Equipment Recommendations  Rolling walker with 5" wheels;Tub/shower seat    Recommendations for Other Services    Frequency  Min 2X/week    Precautions / Restrictions Precautions Precautions: Fall   Pertinent Vitals/Pain No c/o pain. HR during ambulation @100 . Dyspnea 2/4.    ADL  Grooming: Supervision/safety Where Assessed - Grooming: Supported standing Upper Body Bathing: Supervision/safety Where Assessed - Upper Body Bathing: Unsupported sit to stand Lower Body Bathing: Supervision/safety Where Assessed - Lower Body Bathing: Supported sit to stand Upper Body Dressing: Set up Where Assessed - Upper Body Dressing: Unsupported sitting Lower Body Dressing: Supervision/safety Where Assessed - Lower Body Dressing: Supported sit to stand Toilet Transfer: Supervision/safety Statistician Method: Other (comment) Equipment Used: Gait belt Transfers/Ambulation Related to ADLs: S. Miniguard to min A at times. min sway during gait with head turns  ADL Comments: overall S. Noted B LE edema. Feel pt would benefit from TED hose (Discussed need to elevate BLE to decrease edema)    OT Diagnosis: Generalized weakness  OT Problem List: Decreased activity tolerance;Impaired  balance (sitting and/or standing);Decreased knowledge of use of DME or AE;Increased edema OT Treatment Interventions: Self-care/ADL training;Therapeutic exercise;Energy conservation;DME and/or AE instruction;Therapeutic activities;Patient/family education;Balance training   OT Goals Acute Rehab OT Goals OT Goal Formulation: With patient Time For Goal Achievement: 04/07/12 Potential to Achieve Goals: Good ADL Goals Pt Will Perform Tub/Shower Transfer: with supervision;Ambulation;with DME ADL Goal: Tub/Shower Transfer - Progress: Goal set today Additional ADL Goal #1: Pt will demonstrate at least 1 E conservation technique during ADL session without vc. ADL Goal: Additional Goal #1 - Progress: Goal set today Additional ADL Goal #2: PT will complete functional mobility @ RW level with distand S during functional ADL task. ADL Goal: Additional Goal #2 - Progress: Goal set today Arm Goals Pt Will Complete Theraband Exer: Bilateral upper extremities;to increase strength;with supervision, verbal cues required/provided;2 sets;Level 2 Theraband Arm Goal: Theraband Exercises - Progress: Goal set today  Visit Information  Last OT Received On: 03/24/12 Assistance Needed: +1    Subjective Data   I want to go home.   Prior Functioning     Home Living Lives With: Alone Available Help at Discharge: Family;Available PRN/intermittently;Available 24 hours/day (nephew) Type of Home: House Home Access: Stairs to enter [employer] of Steps: 2 Entrance Stairs-Rails: None Home Layout: One level Bathroom Shower/Tub: Forensic scientist: Standard Bathroom Accessibility: Yes How Accessible: Accessible via walker Home Adaptive Equipment: Shower chair with back;Walker - rolling Prior Function Level of Independence: Independent Able to Take Stairs?: Yes Driving: Yes Communication Communication: No difficulties Dominant Hand: Left         Vision/Perception  Vision - Assessment Additional Comments: glaucoma   Cognition  Overall Cognitive Status: Appears within functional  limits for tasks assessed/performed Arousal/Alertness: Awake/alert Orientation Level: Appears intact for tasks assessed Behavior During Session: [facility] for tasks performed    Extremity/Trunk Assessment Right Upper Extremity Assessment RUE ROM/Strength/Tone: WFL for tasks assessed RUE Sensation: WFL - Light Touch;WFL - Proprioception RUE Coordination: WFL - gross/fine motor Left Upper Extremity Assessment LUE ROM/Strength/Tone: WFL for tasks assessed LUE Sensation: WFL - Light Touch;WFL - Proprioception LUE Coordination: WFL - gross/fine motor Right Lower Extremity Assessment RLE ROM/Strength/Tone: WFL for tasks assessed RLE Sensation: WFL - Light Touch;WFL - Proprioception RLE Coordination: WFL - gross/fine motor Left Lower Extremity Assessment LLE ROM/Strength/Tone: WFL for tasks assessed LLE Sensation: WFL - Light Touch;WFL - Proprioception LLE Coordination: WFL - gross/fine motor     Mobility Bed Mobility Bed Mobility: Supine to Sit;Sitting - Scoot to Edge of Bed Supine to Sit: 7: Independent Sitting - Scoot to Edge of Bed: 7: Independent Transfers Transfers: Sit to Stand;Stand to Sit Sit to Stand: 4: Min guard;With upper extremity assist;From chair/3-in-1 Stand to Sit: 4: Min guard;With upper extremity assist;To chair/3-in-1 Details for Transfer Assistance: LOB noted                Balance High Level Balance High Level Balance Activites: Backward walking;Direction changes;Turns;Sudden stops;Head turns High Level Balance Comments: notable sway with head turns. LOB x1 with quick turns.    End of Session OT - End of Session Equipment Utilized During Treatment: Gait belt Activity Tolerance: Patient tolerated treatment well Patient left: in chair;with call bell/phone within reach;with family/visitor present Nurse Communication: Mobility status   GO Functional Assessment Tool Used: clinical judgement Functional Limitation: Self care Self Care Current Status ([id number]): At least 1 percent but less than 20 percent impaired, limited or restricted Self Care Goal Status (U0454): At least 1 percent but less than 20 percent impaired, limited or restricted   [facility] 03/24/2012, 10:42 AM [facility], OTR/L  [phone] 03/24/2012

## 2012-03-24 NOTE — Progress Notes (Signed)
Pharmacy note re: Eliquis  Changing from Lovenox and Coumadin to Eliquis for afib. Last Lovenox dose given at 5:30am.  Will schedule 1st Eliquis dose for 6pm tonight.  Could change to 8am/8pm dosing tomorrow for convenience. Eliquis dose adjusted from 5 to 2.5 mg BID for age > 76 yrs old AND weight <60 kg.  Nicolette Bang, RPh Pager: [phone] 03/24/2012 12noon

## 2012-03-25 DIAGNOSIS — H409 Unspecified glaucoma: Secondary | ICD-10-CM

## 2012-03-25 MED ORDER — FUROSEMIDE 20 MG PO TABS: 20.0000 mg | ORAL_TABLET | Freq: Every day | ORAL | Status: DC | PRN

## 2012-03-25 MED ORDER — METOPROLOL TARTRATE 50 MG PO TABS: 50.0000 mg | ORAL_TABLET | Freq: Two times a day (BID) | ORAL | Status: DC

## 2012-03-25 MED ORDER — APIXABAN 2.5 MG PO TABS: 2.5000 mg | ORAL_TABLET | Freq: Two times a day (BID) | ORAL | Status: DC

## 2012-03-25 NOTE — Progress Notes (Signed)
Occupational Therapy Treatment Patient Details Name: Sydney Hensley MRN: [id number] DOB: 02-Dec-1927 Today's Date: 03/25/2012 Time: 4098-1191 OT Time Calculation (min): 25 min  OT Assessment / Plan / Recommendation Comments on Treatment Session Making good progress. Rec HHOT due to pt living independently and to reduce chance of readmission.    Follow Up Recommendations  Home health OT    Barriers to Discharge   none    Equipment Recommendations    none   Recommendations for Other Services    Frequency Min 2X/week   Plan Discharge plan remains appropriate    Precautions / Restrictions Precautions Precautions: Fall   Pertinent Vitals/Pain No c/o pain    ADL  Grooming: Modified independent Where Assessed - Grooming: Unsupported standing Upper Body Bathing: Modified independent Where Assessed - Upper Body Bathing: Unsupported standing Lower Body Bathing: Supervision/safety Where Assessed - Lower Body Bathing: Unsupported sit to stand Upper Body Dressing: Modified independent Where Assessed - Upper Body Dressing: Unsupported sitting Lower Body Dressing: Supervision/safety Toilet Transfer: Supervision/safety Toilet Transfer Method: Sit to Barista: Comfort height toilet Tub/Shower Transfer: Supervision/safety Tub/Shower Transfer Method: Ambulating Transfers/Ambulation Related to ADLs: S. Pt demonstrates occational sway and recovers by holding onto furniture.  ADL Comments: Discussed importance of 24/7 S initially after D/C. Pt's niece will be able to provide level of needed supervision after D/C.Marland Kitchen Pt given written information on home safety and recommendations.    OT Diagnosis:    OT Problem List:   OT Treatment Interventions:     OT Goals Acute Rehab OT Goals OT Goal Formulation: With patient Time For Goal Achievement: 04/07/12 Potential to Achieve Goals: Good ADL Goals Pt Will Perform Tub/Shower Transfer: with supervision;Ambulation;with  DME ADL Goal: Tub/Shower Transfer - Progress: Met Additional ADL Goal #1: Pt will demonstrate at least 1 E conservation technique during ADL session without vc. ADL Goal: Additional Goal #1 - Progress: Met Additional ADL Goal #2: PT will complete functional mobility @ RW level with distand S during functional ADL task. ADL Goal: Additional Goal #2 - Progress: Met Arm Goals Pt Will Complete Theraband Exer: Bilateral upper extremities;to increase strength;with supervision, verbal cues required/provided;2 sets;Level 2 Theraband Arm Goal: Theraband Exercises - Progress: Met  Visit Information  Last OT Received On: 03/25/12 Assistance Needed: +1    Subjective Data      Prior Functioning       Cognition  Overall Cognitive Status:  (report of pt being confused last night. ? med related? ) Arousal/Alertness: Awake/alert Orientation Level: Appears intact for tasks assessed Behavior During Session: [facility] for tasks performed    Mobility  Shoulder Instructions Bed Mobility Bed Mobility: Supine to Sit Supine to Sit: 7: Independent Sitting - Scoot to Edge of Bed: 7: Independent Transfers Transfers: Sit to Stand;Stand to Sit Sit to Stand: 5: Supervision Stand to Sit: 5: Supervision       Exercises      Balance  S   End of Session OT - End of Session Equipment Utilized During Treatment: Gait belt Activity Tolerance: Patient tolerated treatment well Patient left: in chair;with call bell/phone within reach Nurse Communication: Mobility status  GO Functional Assessment Tool Used: clinical judgement Functional Limitation: Self care Self Care Current Status [phone]): At least 1 percent but less than 20 percent impaired, limited or restricted Self Care Goal Status (F6213): At least 1 percent but less than 20 percent impaired, limited or restricted Self Care Discharge Status [phone]): At least 1 percent but less than 20 percent impaired,  limited or restricted   Maisha Bogen,HILLARY 03/25/2012,  11:45 AM [facility], OTR/L  [phone] 03/25/2012

## 2012-03-25 NOTE — Progress Notes (Signed)
Physical Therapy Treatment Patient Details Name: Sydney Hensley MRN: [id number] DOB: 10-18-1927 Today's Date: 03/25/2012 Time: 6295-2841 PT Time Calculation (min): 10 min  PT Assessment / Plan / Recommendation Comments on Treatment Session  Session focus on safety with RW and mobility at home. Pt educated on using the RW when up and ambulating in the house as well as making sure floors are clear for safety with use of RW.    Follow Up Recommendations  Home health PT;Supervision for mobility/OOB     Does the patient have the potential to tolerate intense rehabilitation     Barriers to Discharge        Equipment Recommendations  Rolling walker with 5" wheels    Recommendations for Other Services    Frequency     Plan      Precautions / Restrictions Precautions Precautions: Fall Restrictions Weight Bearing Restrictions: No       Mobility  Bed Mobility Bed Mobility: Not assessed Transfers Transfers: Sit to Stand;Stand to Sit Sit to Stand: 5: Supervision Stand to Sit: 5: Supervision Details for Transfer Assistance: cues for safe sequencing with use of RW and hand placement Ambulation/Gait Ambulation Distance (Feet): 250 Feet Assistive device: Rolling walker Ambulation/Gait Assistance Details: improved stability with use of RW however still needing cues to keep RW with her until another stable surface as opposed to abandoning RW as soon as she comes into her room, sequence cues for use of RW when back up to chair in prep to sit Gait Pattern: Antalgic Stairs: Yes Stairs Assistance: 4: Min guard Stairs Assistance Details (indicate cue type and reason): pt instructed in sideways performance of steps for improved safety, cues for safest body positioning as pt tends to angle her body slightly with once LOB of which she was able to correct independently with use of rail Stair Management Technique: Sideways;One rail Left Number of Stairs: 3       PT Goals Acute Rehab PT  Goals PT Goal: Sit to Stand - Progress: Progressing toward goal PT Goal: Stand to Sit - Progress: Progressing toward goal PT Goal: Ambulate - Progress: Progressing toward goal PT Goal: Up/Down Stairs - Progress: Progressing toward goal  Visit Information  Last PT Received On: 03/25/12 Assistance Needed: +1    Subjective Data  Subjective: I will be fine when I go home.    Cognition  Overall Cognitive Status: Appears within functional limits for tasks assessed/performed Arousal/Alertness: Awake/alert Orientation Level: Appears intact for tasks assessed Behavior During Session: [facility] for tasks performed Cognition - Other Comments: Although we reviewed using RW and having supervision with mobility at home pt still getting out of chair and roaming room to "clean up" her mess prior to d/c today, question her using RW at home although recommended and educated on    Balance     End of Session PT - End of Session Equipment Utilized During Treatment: Gait belt Activity Tolerance: Patient tolerated treatment well Patient left: in chair;with call bell/phone within reach Nurse Communication: Mobility status   GP     [facility] HELEN 03/25/2012, 3:01 PM

## 2012-03-25 NOTE — Progress Notes (Signed)
Reviewed discharge instructions with patient.  All written discharge instructions given to patient, along with "Living Better With Heart Failure" booklet. Prescriptions given.  No stated questions or complaints.  Margaretmary Bayley, RN

## 2012-03-25 NOTE — Discharge Summary (Signed)
Physician Discharge Summary  Sydney Hensley EAV:[id number] DOB: April 07, 1928 DOA: 03/22/2012  PCP: Ezequiel Kayser, MD  Admit date: 03/22/2012 Discharge date: 03/25/2012  Time spent: 45 minutes  Recommendations for Outpatient Follow-up:  1. Monitor for side effects from new anticoagulant 2. Monitor fluid status 3.   Discharge Diagnoses:  Acute on chronic diastolic Congestive heart failure  Atrial fibrillation with rapid ventricular response  Glaucoma  Hypertension   Discharge Condition: good  Diet recommendation: heart healthy   Filed Weights   03/23/12 0024 03/24/12 0644 03/25/12 0457  Weight: 62.007 kg (136 lb 11.2 oz) 55.4 kg (122 lb 2.2 oz) 54.3 kg (119 lb 11.4 oz)    History of present illness:  This is a pleasant 78 year old female who lives alone. She stated for approximately last 10 days and she's had a cold and a cough. She states the cough is nonproductive, she denies any wheezing, fevers, chills, nausea, vomiting or diarrhea. Over the past few days she's noted swelling on her bilateral lower extremities. She denies any chest pains, palpitation or lightheadedness. Her shortness of breath has become progressively worse and today she could hardly breathe. She called her PCP who sent her to the ER. Early in the week she had called her PCP and had antibiotics prescribed. She is taking 6 of the 7 days and feels no better. History provided the patient is alert and oriented.      Hospital Course:  1. new onset congestive heart failure-patient was started on IV diuretics. She had improvement of her symptoms. She was -7932 cc during this admission. We didn't the patient euvolemic by the time of discharge. Ongoing treatment with beta blocker and ACE inhibitor was prescribed. Diuretic to be taken if she starts gaining weight. She was thoroughly educated about salt and fluid restriction. Echocardiogram reveals preserved EF. Suspect patient has diastolic dysfunction 2. atrial fibrillation  with rapid ventricular response - rate control was achieved with beta blockers. Chads 2 score is 2. Given the patient's advanced age but good functional status - I think we should provide anticoagulation for primary stroke prevention. Patient was started on apixaban.   Procedures: 2D echo Left ventricle: The cavity size was normal. Wall thickness was normal. Systolic function was low normal. The estimated ejection fraction was in the range of 50% to 55%. Regional wall motion abnormalities cannot be excluded. Diastolic dysfuction could not accurately be assesed due to atrial fibrillation. - Ventricular septum: Septal motion showed abnormal function and dyssynergy. - Mitral valve: Moderately calcified annulus. Mild regurgitation. - Left atrium: The atrium was moderately to severely dilated. - Right ventricle: Systolic pressure was increased. - Right atrium: The atrium was moderately dilated. - Atrial septum: No defect or patent foramen ovale was identified. - Tricuspid valve: Mild regurgitation. - Pulmonary arteries: PA peak pressure: 36mm Hg (S). - Pericardium, extracardiac: There was a left pleural effusion.   Consultations:  none  Discharge Exam: Filed Vitals:   03/24/12 1451 03/24/12 2000 03/24/12 2103 03/25/12 0457  BP: 120/80 135/56  119/69  Pulse: 75 65 76 70  Temp:  98 F (36.7 C)  98.7 F (37.1 C)  TempSrc:  Oral  Oral  Resp:  18  20  Height:      Weight:    54.3 kg (119 lb 11.4 oz)  SpO2: 97% 98%  95%    General: axox3 Cardiovascular: irreg irreg  Respiratory: ctab   Discharge Instructions  Discharge Orders    Future Orders Please Complete By Expires  Diet - low sodium heart healthy      Increase activity slowly      (HEART FAILURE PATIENTS) Call MD:  Anytime you have any of the following symptoms: 1) 3 pound weight gain in 24 hours or 5 pounds in 1 week 2) shortness of breath, with or without a dry hacking cough 3) swelling in the hands, feet or  stomach 4) if you have to sleep on extra pillows at night in order to breathe.          Medication List     As of 03/25/2012 10:38 AM    STOP taking these medications         aspirin 81 MG chewable tablet      doxycycline 100 MG DR capsule   Commonly known as: DORYX      TAKE these medications         apixaban 2.5 MG Tabs tablet   Commonly known as: ELIQUIS   Take 1 tablet (2.5 mg total) by mouth 2 (two) times daily.      furosemide 20 MG tablet   Commonly known as: LASIX   Take 1 tablet (20 mg total) by mouth daily as needed (if you gain more than 1 pound).      lisinopril 5 MG tablet   Commonly known as: PRINIVIL,ZESTRIL   Take 5 mg by mouth daily.      metoprolol 50 MG tablet   Commonly known as: LOPRESSOR   Take 1 tablet (50 mg total) by mouth 2 (two) times daily.      pravastatin 20 MG tablet   Commonly known as: PRAVACHOL   Take 20 mg by mouth daily.      vitamin B-12 500 MCG tablet   Commonly known as: CYANOCOBALAMIN   Take 500 mcg by mouth daily.      VITAMIN D3 SUPER STRENGTH 2000 UNITS Tabs   Generic drug: Cholecalciferol   Take 1 tablet by mouth daily.           Follow-up Information    Follow up with PERINI,MARK A, MD. Schedule an appointment as soon as possible for a visit in 4 days.   Contact information:   [address] [place] [address] [phone]           The results of significant diagnostics from this hospitalization (including imaging, microbiology, ancillary and laboratory) are listed below for reference.    Significant Diagnostic Studies: Dg Chest 2 View  03/22/2012  *RADIOLOGY REPORT*  Clinical Data: Shortness of breath  CHEST - 2 VIEW  Comparison: None.  Findings: There are small bilateral pleural effusions with consolidation of bilateral lung bases.  There is a small opacity in the right upper lobe, early developing pneumonia is possible.The mediastinal contour is normal.  Evaluation of heart size is limited due to loss  of heart border.  There is minimal degenerative joint changes of the spine.  The soft tissues are otherwise normal.  IMPRESSION: Small bilateral pleural effusions with consolidation of bilateral lung bases at least in part due to atelectasis but underlying pneumonia is not excluded.  There is a small opacity in the right upper lobe, early developing pneumonia is possible.   Original Report Authenticated By: Sherian Rein, M.D.     Microbiology: No results found for this or any previous visit (from the past 240 hour(s)).   Labs: Basic Metabolic Panel:  Lab 03/24/12 9629 03/23/12 0302 03/22/12 1817  NA 143 145 143  K 3.5 3.4*  4.1  CL 99 105 104  CO2 32 28 30  GLUCOSE 87 87 107*  BUN 18 17 19   CREATININE 0.79 0.75 0.80  CALCIUM 9.5 9.0 9.4  MG -- -- --  PHOS -- -- --   Liver Function Tests: No results found for this basename: AST:5,ALT:5,ALKPHOS:5,BILITOT:5,PROT:5,ALBUMIN:5 in the last 168 hours No results found for this basename: LIPASE:5,AMYLASE:5 in the last 168 hours No results found for this basename: AMMONIA:5 in the last 168 hours CBC:  Lab 03/23/12 0302 03/22/12 1817  WBC 7.6 8.8  NEUTROABS -- --  HGB 13.6 14.3  HCT 41.5 42.7  MCV 92.4 91.6  PLT 222 243   Cardiac Enzymes:  Lab 03/22/12 1817  CKTOTAL --  CKMB --  CKMBINDEX --  TROPONINI <0.30   BNP: BNP (last 3 results)  Basename 03/23/12 0302 03/22/12 1817  PROBNP 2337.0* 2124.0*   CBG: No results found for this basename: GLUCAP:5 in the last 168 hours     Signed:  Montasia Chisenhall  [facility] 03/25/2012, 10:38 AM

## 2012-04-19 ENCOUNTER — Ambulatory Visit (INDEPENDENT_AMBULATORY_CARE_PROVIDER_SITE_OTHER): Payer: Medicare Other | Admitting: Cardiology

## 2012-04-19 ENCOUNTER — Encounter: Payer: Self-pay | Admitting: Cardiology

## 2012-04-19 VITALS — BP 188/97 | HR 68 | Wt 136.8 lb

## 2012-04-19 DIAGNOSIS — R609 Edema, unspecified: Secondary | ICD-10-CM

## 2012-04-19 DIAGNOSIS — R600 Localized edema: Secondary | ICD-10-CM

## 2012-04-19 MED ORDER — POTASSIUM CHLORIDE ER 10 MEQ PO TBCR: 10.0000 meq | EXTENDED_RELEASE_TABLET | Freq: Every day | ORAL | Status: DC

## 2012-04-19 NOTE — Patient Instructions (Addendum)
Please increase your Furosemide to 40 mg a day for 4 days then return to 20 mg a day. Start potassium chloride 10 MEQ a day. Hold Pradaxa for 2 weeks Start Xarelto 20 mg a day.  Continue all other medications as listed  Please have blood work today (BMP)  Follow up with PA/NP in 7 days.

## 2012-04-19 NOTE — Progress Notes (Signed)
HPI The patient presents for evaluation of atrial fibrillation. She was hospitalized last month with shortness of breath and congestive heart failure and was found to be in atrial fibrillation. She was treated with anticoagulation, rate control and diuretics. However, she's not been taking her Lasix as prescribed. She could not afford Eliquis and so was switched to Pradaxa.  However, several days ago she began to notice a rash on her back and it's unclear whether this could be related. She has had shortness of breath with mild activities that she thinks has been slowly progressive. She's had some increased lower extremity swelling. She lives by herself in her knee seems to indicate some mild memory problems. She administers her own medications. She says she doesn't abuse salt. She doesn't notice any palpitations and has had no presyncope or syncope. She's had no PND or orthopnea. I did review the hospital records and her ejection fraction was well-preserved though she had some left atrial enlargement.  No Known Allergies  Current Outpatient Prescriptions  Medication Sig Dispense Refill  . dabigatran (PRADAXA) 150 MG CAPS Take 150 mg by mouth 2 (two) times daily.      Marland Kitchen lisinopril (PRINIVIL,ZESTRIL) 20 MG tablet Take 20 mg by mouth daily.      . metoprolol (LOPRESSOR) 50 MG tablet Take 1 tablet (50 mg total) by mouth 2 (two) times daily.  60 tablet  0  . potassium chloride (K-DUR) 10 MEQ tablet Take 1 tablet (10 mEq total) by mouth daily.  30 tablet  11    Past Medical History  Diagnosis Date  . Glaucoma   . Hypertension   . Dysrhythmia   . Atrial fibrillation   . CHF (congestive heart failure)     Diastolic dysfunction    Past Surgical History  Procedure Date  . Abdominal hysterectomy     Family History  Problem Relation Age of Onset  . Cancer Father   . CVA Mother     History   Social History  . Marital Status: Single    Spouse Name: N/A    Number of Children: N/A  .  Years of Education: N/A   Occupational History  . Not on file.   Social History Main Topics  . Smoking status: Former Games developer  . Smokeless tobacco: Never Used  . Alcohol Use: No  . Drug Use: No  . Sexually Active: No   Other Topics Concern  . Not on file   Social History Narrative  . No narrative on file    ROS:  Positive for varicose veins. Otherwise as stated in the history of present illness and negative for all other systems.  PHYSICAL EXAM BP 188/97  Pulse 68  Wt 136 lb 12.8 oz (62.052 kg) GENERAL:  Frail appearing, no distress HEENT:  Pupils equal round and reactive, fundi not visualized, oral mucosa unremarkable NECK:  Jugular venous distention 10 cm at 45, waveform within normal limits, carotid upstroke brisk and symmetric, no bruits, no thyromegaly LYMPHATICS:  No cervical, inguinal adenopathy LUNGS:  Clear to auscultation bilaterally BACK:  No CVA tenderness CHEST:  Unremarkable HEART:  PMI not displaced or sustained,S1 and S2 within normal limits, no S3,no clicks, no rubs, no murmurs, irregular ABD:  Flat, positive bowel sounds normal in frequency in pitch, no bruits, no rebound, no guarding, no midline pulsatile mass, no hepatomegaly, no splenomegaly EXT:  2 plus pulses throughout, no cyanosis no clubbing, left greater than right lower extremity edema moderate with left leg blistering  SKIN:  No nodules, she does have any eruptive rash diffusely on her back NEURO:  Cranial nerves II through XII grossly intact, motor grossly intact throughout PSYCH:  Cognitively intact, oriented to person place and time   ASSESSMENT AND PLAN  Congestive heart failure - The patient has right greater than left symptoms. She's not been taking her diuretic as prescribed. I have told her to increase her diuretic to 40 mg in the morning and I've given her a prescription for potassium to go along with this. I told her she needs to keep her feet elevated and limit salt. We're going to  check a basic metabolic profile today and when she comes back in several days for followup of her volume. If she has any increasing shortness of breath she'll need to present to the emergency room.  Rash - I'm afraid this could be related to the Pradaxa.  I'm going to take her off of this and substitute with samples of Xarelto.  If the rash gets better she will not restart the Pradaxa.  However, at that point she would likely need to be switched to warfarin as she would not be able to afford the Xarelto.  If the rash is not improved she can restart the Pradaxa which is covered by her insurance.  Atrial fibrillation - Her rate seems to be controlled. She will continue with her current regimen.

## 2012-04-20 LAB — BASIC METABOLIC PANEL
BUN: 20 mg/dL (ref 6–23)
Creatinine, Ser: 0.8 mg/dL (ref 0.4–1.2)
GFR: 72.94 mL/min (ref >60.00)

## 2012-04-26 ENCOUNTER — Other Ambulatory Visit: Payer: Self-pay | Admitting: *Deleted

## 2012-04-26 ENCOUNTER — Encounter: Payer: Self-pay | Admitting: Nurse Practitioner

## 2012-04-26 ENCOUNTER — Institutional Professional Consult (permissible substitution): Payer: PRIVATE HEALTH INSURANCE | Admitting: Cardiology

## 2012-04-26 ENCOUNTER — Ambulatory Visit (INDEPENDENT_AMBULATORY_CARE_PROVIDER_SITE_OTHER): Payer: Medicare Other | Admitting: Nurse Practitioner

## 2012-04-26 VITALS — BP 186/90 | HR 58 | Ht 67.0 in | Wt 131.0 lb

## 2012-04-26 DIAGNOSIS — I10 Essential (primary) hypertension: Secondary | ICD-10-CM

## 2012-04-26 DIAGNOSIS — I5032 Chronic diastolic (congestive) heart failure: Secondary | ICD-10-CM

## 2012-04-26 LAB — BASIC METABOLIC PANEL
BUN: 18 mg/dL (ref 6–23)
CO2: 36 mEq/L — ABNORMAL HIGH (ref 19–32)
Calcium: 9.2 mg/dL (ref 8.4–10.5)
Chloride: 100 mEq/L (ref 96–112)
Creatinine, Ser: 0.9 mg/dL (ref 0.4–1.2)
GFR: 62.07 mL/min (ref >60.00)
Glucose, Bld: 99 mg/dL (ref 70–99)
Potassium: 3.2 mEq/L — ABNORMAL LOW (ref 3.5–5.1)
Sodium: 142 mEq/L (ref 135–145)

## 2012-04-26 MED ORDER — LISINOPRIL 20 MG PO TABS: 20.0000 mg | ORAL_TABLET | Freq: Every day | ORAL | Status: DC

## 2012-04-26 MED ORDER — FUROSEMIDE 40 MG PO TABS: 40.0000 mg | ORAL_TABLET | Freq: Every day | ORAL | Status: DC

## 2012-04-26 MED ORDER — DABIGATRAN ETEXILATE MESYLATE 150 MG PO CAPS: 150.0000 mg | ORAL_CAPSULE | Freq: Two times a day (BID) | ORAL | Status: DC

## 2012-04-26 MED ORDER — METOPROLOL TARTRATE 50 MG PO TABS: 50.0000 mg | ORAL_TABLET | Freq: Two times a day (BID) | ORAL | Status: DC

## 2012-04-26 NOTE — Patient Instructions (Addendum)
We need to check labwork today  Keep elevating your legs and avoiding salt  Get a pillbox that has morning and evening slots for your medicines  We are going to go back on the Pradaxa 150 mg two times a day  Go by this current list of your medicines.  Stay on the 40 mg of Lasix each morning  I will see you on Friday

## 2012-04-26 NOTE — Progress Notes (Signed)
Sydney Hensley Date of Birth: [birth year] Medical Record #[id number]  History of Present Illness: Ms. Sydney Hensley is seen back today for a one week check. She is seen for Dr. Antoine Poche. She was most recently hospitalized last month with dyspnea and CHF. She was found to be in atrial fib. She has well preserved LV function.   Seen here last week. Noted to have a rash. Pradaxa was switched to Xarelto. Lasix was increased due to swelling. She was to also be on Metoprolol and Lisinopril.   She comes back today. She is here with a friend. She says she is doing ok. Still has somewhat of a rash but what she seems to be referring to are blisters on her legs and not what's on her back. She has swelling in her legs. Weight is down. Not using salt. Says she does not eat too much boxed/canned items. No eating out. No chest pain. Not dizzy or lightheaded. BP is quite elevated. She is not really able to tell me her medicines or how she is taking them. But it sounds like she is only taking Lisinopril (and not metoprolol) since she says she is just taking the one that says once a day. She thinks she is taking the potassium but says she has ran out of everything else including her Lasix. She has been trying to manage her own medicines and not using a pill box.   Current Outpatient Prescriptions on File Prior to Visit  Medication Sig Dispense Refill  . potassium chloride (K-DUR) 10 MEQ tablet Take 1 tablet (10 mEq total) by mouth daily.  30 tablet  11  . [DISCONTINUED] lisinopril (PRINIVIL,ZESTRIL) 20 MG tablet Take 20 mg by mouth daily.      . [DISCONTINUED] dabigatran (PRADAXA) 150 MG CAPS Take 150 mg by mouth 2 (two) times daily.      . [DISCONTINUED] furosemide (LASIX) 20 MG tablet Take 1 tablet (20 mg total) by mouth daily as needed (if you gain more than 1 pound).  30 tablet  0  . [DISCONTINUED] metoprolol (LOPRESSOR) 50 MG tablet Take 1 tablet (50 mg total) by mouth 2 (two) times daily.  60 tablet  0    No  Known Allergies  Past Medical History  Diagnosis Date  . Glaucoma   . Hypertension   . Dysrhythmia   . Atrial fibrillation   . CHF (congestive heart failure)     Diastolic dysfunction    Past Surgical History  Procedure Date  . Abdominal hysterectomy     History  Smoking status  . Former Smoker  Smokeless tobacco  . Never Used    History  Alcohol Use No    Family History  Problem Relation Age of Onset  . Cancer Father   . CVA Mother     Review of Systems: The review of systems is per the HPI.  All other systems were reviewed and are negative.  Physical Exam: BP 186/90  Pulse 58  Ht 5\' 7"  (1.702 m)  Wt 131 lb (59.421 kg)  BMI 20.52 kg/m2 Weight is down 5 pounds over the past week. Patient is very pleasant and in no acute distress. Skin is warm and dry. Color is normal.  HEENT is unremarkable except for poor dentition. Normocephalic/atraumatic. PERRL. Sclera are nonicteric. Neck is supple. No masses. No JVD. Lungs are clear. Cardiac exam shows an irregular rhythm. Rate is controlled. Abdomen is soft. Extremities are with 1 to 2+ edema. She has blisters of various sizes  on her legs. They have popped open and are oozing. She has just some spots on her back. Gait and ROM are intact. No gross neurologic deficits noted.  LABORATORY DATA: BMET is pending  Lab Results  Component Value Date   WBC 7.6 03/23/2012   HGB 13.6 03/23/2012   HCT 41.5 03/23/2012   PLT 222 03/23/2012   GLUCOSE 109* 04/19/2012   NA 141 04/19/2012   K 3.8 04/19/2012   CL 105 04/19/2012   CREATININE 0.8 04/19/2012   BUN 20 04/19/2012   CO2 29 04/19/2012   TSH 0.645 03/23/2012   INR 1.09 03/24/2012   Echo Study Conclusions from November 2013  - Left ventricle: The cavity size was normal. Wall thickness was normal. Systolic function was low normal. The estimated ejection fraction was in the range of 50% to 55%. Regional wall motion abnormalities cannot be excluded. Diastolic dysfuction could not  accurately be assesed due to atrial fibrillation. - Ventricular septum: Septal motion showed abnormal function and dyssynergy. - Mitral valve: Moderately calcified annulus. Mild regurgitation. - Left atrium: The atrium was moderately to severely dilated. - Right ventricle: Systolic pressure was increased. - Right atrium: The atrium was moderately dilated. - Atrial septum: No defect or patent foramen ovale was identified. - Tricuspid valve: Mild regurgitation. - Pulmonary arteries: PA peak pressure: 36mm Hg (S). - Pericardium, extracardiac: There was a left pleural effusion.  Assessment / Plan:  1. Diastolic heart failure - her weight is coming down but she still has considerable swelling in her legs with open blisters. She is subsequently seen with Dr. Antoine Poche. He feels like her rash on her back is unchanged, so we can restart the Pradaxa. He also feels that her legs may be a bit better. We will continue with diuretics, elevating her legs. I have put her on 40 mg of the Lasix daily She may use some antibiotic ointment to the blisters. I will see her back on Friday.   2. HTN - uncontrolled - I have refilled all of her medicines. We will have her on the lisinopril and the metoprolol. She does not have a BP cuff at home.   3. Atrial fib - rate seems controlled - we will put her back on the Pradaxa.  She will need close follow up to avoid hospitalization. Her niece [name] will be in charge of filling a pill box and hopefully this will help with compliance. We will check a BMET today. I will see her backon Friday.  Patient is agreeable to this plan and will call if any problems develop in the interim.

## 2012-04-30 ENCOUNTER — Ambulatory Visit (INDEPENDENT_AMBULATORY_CARE_PROVIDER_SITE_OTHER): Payer: Medicare Other | Admitting: Nurse Practitioner

## 2012-04-30 ENCOUNTER — Other Ambulatory Visit: Payer: Self-pay

## 2012-04-30 ENCOUNTER — Telehealth: Payer: Self-pay | Admitting: Nurse Practitioner

## 2012-04-30 ENCOUNTER — Encounter: Payer: Self-pay | Admitting: Nurse Practitioner

## 2012-04-30 VITALS — BP 186/80 | HR 64 | Ht 67.0 in | Wt 125.1 lb

## 2012-04-30 DIAGNOSIS — I5032 Chronic diastolic (congestive) heart failure: Secondary | ICD-10-CM

## 2012-04-30 LAB — BASIC METABOLIC PANEL
BUN: 14 mg/dL (ref 6–23)
CO2: 32 mEq/L (ref 19–32)
Calcium: 9.3 mg/dL (ref 8.4–10.5)
Chloride: 99 mEq/L (ref 96–112)
Creatinine, Ser: 0.9 mg/dL (ref 0.4–1.2)
GFR: 62.07 mL/min (ref >60.00)
Glucose, Bld: 93 mg/dL (ref 70–99)
Potassium: 3.2 mEq/L — ABNORMAL LOW (ref 3.5–5.1)
Sodium: 140 mEq/L (ref 135–145)

## 2012-04-30 MED ORDER — FUROSEMIDE 40 MG PO TABS: 20.0000 mg | ORAL_TABLET | Freq: Every day | ORAL | Status: DC

## 2012-04-30 MED ORDER — LISINOPRIL 20 MG PO TABS: 20.0000 mg | ORAL_TABLET | Freq: Two times a day (BID) | ORAL | Status: DC

## 2012-04-30 MED ORDER — POTASSIUM CHLORIDE ER 10 MEQ PO TBCR: 30.0000 meq | EXTENDED_RELEASE_TABLET | Freq: Every day | ORAL | Status: DC

## 2012-04-30 NOTE — Telephone Encounter (Signed)
plz return call to pt niece [name] [phone], pt was seen  By Lawson Fiscal today. [name] has received several calls regarding pt labs.  plz return call

## 2012-04-30 NOTE — Telephone Encounter (Signed)
**Note De-Identified  Obfuscation** Sydney Hensley is advised that per Norma Fredrickson, NP pt. Needs to increase her Potassium to 30 meq. Daily, she verbalized understanding. RX sent to [facility] on [address].

## 2012-04-30 NOTE — Progress Notes (Addendum)
Sydney Hensley Date of Birth: 09-Dec-1927 Medical Record #[id number]  History of Present Illness: Sydney Hensley is seen back today for a 4 day check. She is seen for Dr. Antoine Hensley. She has atrial fib, new CHF diagnosis with well preserved LV function.   I saw her earlier this week. We had some confusion about her medicines. Her niece is handling her medicines. She is on Pradaxa. Her swelling was felt to be a bit better. BP was uncontrolled and this was felt to be due to miscommunication of her medicines. Her rate was controlled.   She comes back today for follow up. She is here with her niece. She is doing well. Her weight is down 6 pounds since Monday. Her swelling is improving. No chest pain. Still gets a little breathless and tired with her activities but thinks she is better overall. Tolerating her medicines. Not dizzy or lightheaded.   Current Outpatient Prescriptions on File Prior to Visit  Medication Sig Dispense Refill  . dabigatran (PRADAXA) 150 MG CAPS Take 1 capsule (150 mg total) by mouth 2 (two) times daily.  60 capsule    . furosemide (LASIX) 40 MG tablet Take 1 tablet (40 mg total) by mouth daily.  30 tablet  6  . lisinopril (PRINIVIL,ZESTRIL) 20 MG tablet Take 1 tablet (20 mg total) by mouth daily.  30 tablet  6  . metoprolol (LOPRESSOR) 50 MG tablet Take 1 tablet (50 mg total) by mouth 2 (two) times daily.  60 tablet  6  . potassium chloride (K-DUR) 10 MEQ tablet Take 20 mEq by mouth daily.      . [DISCONTINUED] furosemide (LASIX) 20 MG tablet Take 1 tablet (20 mg total) by mouth daily as needed (if you gain more than 1 pound).  30 tablet  0    No Known Allergies  Past Medical History  Diagnosis Date  . Glaucoma   . Hypertension   . Dysrhythmia   . Atrial fibrillation   . CHF (congestive heart failure)     Diastolic dysfunction    Past Surgical History  Procedure Date  . Abdominal hysterectomy     History  Smoking status  . Former Smoker  Smokeless tobacco  .  Never Used    History  Alcohol Use No    Family History  Problem Relation Age of Onset  . Cancer Father   . CVA Mother     Review of Systems: The review of systems is per the HPI.  All other systems were reviewed and are negative.  Physical Exam: BP 186/80  Pulse 64  Ht 5\' 7"  (1.702 m)  Wt 125 lb 1.9 oz (56.754 kg)  BMI 19.60 kg/m2 Patient is very pleasant and in no acute distress. Skin is warm and dry. Color is normal.  HEENT is unremarkable except for poor teeth. Several are broken. Normocephalic/atraumatic. PERRL. Sclera are nonicteric. Neck is supple. No masses. No JVD. Lungs are clear. Cardiac exam shows an irregular rhythm. Her rate is controlled. Abdomen is soft. Extremities are with less edema. Gait and ROM are intact. No gross neurologic deficits noted.   LABORATORY DATA: BMET is pending  Lab Results  Component Value Date   WBC 7.6 03/23/2012   HGB 13.6 03/23/2012   HCT 41.5 03/23/2012   PLT 222 03/23/2012   GLUCOSE 99 04/26/2012   NA 142 04/26/2012   K 3.2* 04/26/2012   CL 100 04/26/2012   CREATININE 0.9 04/26/2012   BUN 18 04/26/2012   CO2  36* 04/26/2012   TSH 0.645 03/23/2012   INR 1.09 03/24/2012     Assessment / Plan: 1. Diastolic heart failure - she is improving. Weight is down 6 pounds. Swelling has improved. She is avoiding salt and keeping her legs up. I have cut her lasix back to just 20 mg a day. Recheck BMET today.   2. HTN - no medicines yet today. Recheck by me is 160/80. Will increase her Lisinopril to BID. She sees Dr. Waynard Edwards next Friday. I will see her back in 3 weeks.   3. Hypokalemia - rechecking today. Hope that with the increase in her ACE that we may be able to cut back or stop her supplementation.   4. Atrial fib - has normal EF. On Pradaxa. Not sure about her long term plan ? If need to consider cardioversion. Will ask Dr. Antoine Hensley to review.   Overall, she does look better. I will see her after Christmas. Patient is agreeable to this plan and  will call if any problems develop in the interim.    I have spoken with Dr. Antoine Hensley - will plan on managing her atrial fib with anticoagulation and rate control.

## 2012-04-30 NOTE — Patient Instructions (Addendum)
Increase the Lisinopril to two times a day.   Cut the Lasix back to just half a tablet daily  Weigh yourself each morning and record.  Take extra dose of diuretic for weight gain of 3 pounds in 24 hours.   Limit sodium intake. Goal is to have less than 2000 mg (2gm) of salt per day.  See Dr. Waynard Edwards next Friday.   I will see you back in 3 weeks.  We will check lab today  Call the [facility] office at [phone] if you have any questions, problems or concerns.

## 2012-05-06 ENCOUNTER — Institutional Professional Consult (permissible substitution): Payer: Medicare Other | Admitting: Cardiology

## 2012-05-07 ENCOUNTER — Encounter: Payer: Self-pay | Admitting: Cardiology

## 2012-05-10 ENCOUNTER — Telehealth: Payer: Self-pay | Admitting: Cardiology

## 2012-05-10 DIAGNOSIS — I5032 Chronic diastolic (congestive) heart failure: Secondary | ICD-10-CM

## 2012-05-10 MED ORDER — LISINOPRIL 20 MG PO TABS: 20.0000 mg | ORAL_TABLET | Freq: Two times a day (BID) | ORAL | Status: DC

## 2012-05-10 NOTE — Telephone Encounter (Signed)
New Problem:    Patient's daughter called in needing a new prescription of her lisinopril (PRINIVIL,ZESTRIL) 20 MG tablet called in to the pharmacy listed on her profile.  Please call back.

## 2012-05-21 ENCOUNTER — Ambulatory Visit: Payer: PRIVATE HEALTH INSURANCE | Admitting: Nurse Practitioner

## 2012-05-31 ENCOUNTER — Encounter: Payer: Self-pay | Admitting: Nurse Practitioner

## 2012-05-31 ENCOUNTER — Ambulatory Visit (INDEPENDENT_AMBULATORY_CARE_PROVIDER_SITE_OTHER): Payer: Medicare Other | Admitting: Nurse Practitioner

## 2012-05-31 VITALS — BP 150/90 | HR 56 | Ht 66.0 in | Wt 120.0 lb

## 2012-05-31 DIAGNOSIS — I5032 Chronic diastolic (congestive) heart failure: Secondary | ICD-10-CM

## 2012-05-31 LAB — BASIC METABOLIC PANEL
BUN: 17 mg/dL (ref 6–23)
CO2: 34 mEq/L — ABNORMAL HIGH (ref 19–32)
Calcium: 9.7 mg/dL (ref 8.4–10.5)
Chloride: 100 mEq/L (ref 96–112)
Creatinine, Ser: 1.1 mg/dL (ref 0.4–1.2)
GFR: 52.7 mL/min — ABNORMAL LOW (ref >60.00)
Glucose, Bld: 121 mg/dL — ABNORMAL HIGH (ref 70–99)
Potassium: 4.3 mEq/L (ref 3.5–5.1)
Sodium: 140 mEq/L (ref 135–145)

## 2012-05-31 NOTE — Patient Instructions (Addendum)
I think you are doing well  Stay on your current medicines  I will see you in 2 months  Call the [facility] office at [phone] if you have any questions, problems or concerns.

## 2012-05-31 NOTE — Progress Notes (Signed)
Sydney Hensley Date of Birth: 12-12-1927 Medical Record #[id number]  History of Present Illness: Sydney Hensley is seen back today for a one month check. She is seen for Dr. Antoine Poche. She has HTN, atrial fib, new CHF diagnosis with well preserved LV function. She is managed medically. She is on chronic anticoagulation. No plans for cardioversion per Dr. Antoine Poche. Her niece [name] is handling her medicines.   I saw her a month ago. She was making progress. BP has been treated with increase of her ACE. She has seen Dr. Waynard Edwards since she was last here and appeared to be doing well.  She comes back today. She is here with [name]. She is doing very well. No chest pain. No palpitations. Not lightheaded or dizzy. Feels ok on her medicines. Weight is down 5 pounds. Not using salt. No falls. No bleeding. Saw Dr. Waynard Edwards on December 20th and was felt to be doing well.   Current Outpatient Prescriptions on File Prior to Visit  Medication Sig Dispense Refill  . dabigatran (PRADAXA) 150 MG CAPS Take 1 capsule (150 mg total) by mouth 2 (two) times daily.  60 capsule    . furosemide (LASIX) 40 MG tablet Take 0.5 tablets (20 mg total) by mouth daily.  30 tablet  6  . lisinopril (PRINIVIL,ZESTRIL) 20 MG tablet Take 1 tablet (20 mg total) by mouth 2 (two) times daily.  60 tablet  6  . metoprolol (LOPRESSOR) 50 MG tablet Take 1 tablet (50 mg total) by mouth 2 (two) times daily.  60 tablet  6  . potassium chloride (K-DUR) 10 MEQ tablet Take 3 tablets (30 mEq total) by mouth daily.  90 tablet  3  . pravastatin (PRAVACHOL) 20 MG tablet Take 20 mg by mouth at bedtime.        No Known Allergies  Past Medical History  Diagnosis Date  . Glaucoma   . Hypertension   . Dysrhythmia   . Atrial fibrillation   . CHF (congestive heart failure)     Diastolic dysfunction    Past Surgical History  Procedure Date  . Abdominal hysterectomy     History  Smoking status  . Former Smoker  Smokeless tobacco  . Never  Used    History  Alcohol Use No    Family History  Problem Relation Age of Onset  . Cancer Father   . CVA Mother     Review of Systems: The review of systems is per the HPI.  All other systems were reviewed and are negative.  Physical Exam: BP 150/90  Pulse 56  Ht 5\' 6"  (1.676 m)  Wt 120 lb (54.432 kg)  BMI 19.37 kg/m2 Patient is very pleasant and in no acute distress. Skin is warm and dry. Color is normal.  HEENT is unremarkable. Normocephalic/atraumatic. PERRL. Sclera are nonicteric. Neck is supple. No masses. No JVD. Lungs are clear. Cardiac exam shows an irregular rhythm. Rate is controlled. Abdomen is soft. Extremities are without edema. Gait and ROM are intact. No gross neurologic deficits noted.   LABORATORY DATA: BMET is pending.  Lab Results  Component Value Date   WBC 7.6 03/23/2012   HGB 13.6 03/23/2012   HCT 41.5 03/23/2012   PLT 222 03/23/2012   GLUCOSE 93 04/30/2012   NA 140 04/30/2012   K 3.2* 04/30/2012   CL 99 04/30/2012   CREATININE 0.9 04/30/2012   BUN 14 04/30/2012   CO2 32 04/30/2012   TSH 0.645 03/23/2012   INR 1.09 03/24/2012  Assessment / Plan:  1. Chronic atrial fib - managed with rate control and anticoagulation with Pradaxa.  She is asymptomatic.  2. Diastolic heart failure - looks compensated. Weight is stable at home. Check BMET today. She is seeing Dr. Waynard Edwards in one month. I will see her back in 2 months.  3. HTN - repeat BP by me is good at 120/84. No change in her medicines today.  Patient is agreeable to this plan and will call if any problems develop in the interim.

## 2012-07-30 ENCOUNTER — Ambulatory Visit (INDEPENDENT_AMBULATORY_CARE_PROVIDER_SITE_OTHER): Payer: Medicare Other | Admitting: Nurse Practitioner

## 2012-07-30 ENCOUNTER — Encounter: Payer: Self-pay | Admitting: Nurse Practitioner

## 2012-07-30 VITALS — BP 142/84 | HR 56 | Ht 66.0 in | Wt 122.8 lb

## 2012-07-30 DIAGNOSIS — I5032 Chronic diastolic (congestive) heart failure: Secondary | ICD-10-CM

## 2012-07-30 NOTE — Patient Instructions (Addendum)
Stay on your current medicines  Keep a check on your weights and restricting salt  Stay active and safe  We will see you back in October  Call the [facility] office at [phone] if you have any questions, problems or concerns.

## 2012-07-30 NOTE — Progress Notes (Signed)
Sydney Hensley Date of Birth: [birth year] Medical Record #[id number]  History of Present Illness: Sydney Hensley is seen back today for a 2 month check. She is seen for Dr. Antoine Hensley. She has HTN, atrial fib - on Pradaxa, CHF with well preserved LV function and is managed medically. No plans for cardioversion. Her niece Sydney Hensley is handling her medicines.   I saw her 2 months ago. She was doing ok. Has seen Dr. Waynard Hensley since my last visit with her. Those records are reviewed. She was felt to be stable and sees him again in August.   She comes in today. She is here with Sydney Hensley, her niece. She is doing ok. Excited about the ACC. No chest pain. Not short of breath unless she walks too far. Swelling is ok. Still wearing her stockings. Has chronic stasis changes. No palpitations. Tolerating her medicines ok. Overall thinks she is doing ok. Remains unsteady but no falls. Does need her handicap sticker resigned.    Current Outpatient Prescriptions on File Prior to Visit  Medication Sig Dispense Refill  . dabigatran (PRADAXA) 150 MG CAPS Take 1 capsule (150 mg total) by mouth 2 (two) times daily.  60 capsule    . furosemide (LASIX) 40 MG tablet Take 0.5 tablets (20 mg total) by mouth daily.  30 tablet  6  . ibandronate (BONIVA) 150 MG tablet Take 150 mg by mouth every 30 (thirty) days. Take in the morning with a full glass of water, on an empty stomach, and do not take anything else by mouth or lie down for the next 30 min.      Marland Kitchen lisinopril (PRINIVIL,ZESTRIL) 20 MG tablet Take 1 tablet (20 mg total) by mouth 2 (two) times daily.  60 tablet  6  . metoprolol (LOPRESSOR) 50 MG tablet Take 1 tablet (50 mg total) by mouth 2 (two) times daily.  60 tablet  6  . potassium chloride (K-DUR) 10 MEQ tablet Take 3 tablets (30 mEq total) by mouth daily.  90 tablet  3  . pravastatin (PRAVACHOL) 20 MG tablet Take 20 mg by mouth at bedtime.       No current facility-administered medications on file prior to visit.     No Known Allergies  Past Medical History  Diagnosis Date  . Glaucoma   . Hypertension   . Dysrhythmia   . Atrial fibrillation   . CHF (congestive heart failure)     Diastolic dysfunction    Past Surgical History  Procedure Laterality Date  . Abdominal hysterectomy      History  Smoking status  . Former Smoker  Smokeless tobacco  . Never Used    History  Alcohol Use No    Family History  Problem Relation Age of Onset  . Cancer Father   . CVA Mother     Review of Systems: The review of systems is per the HPI.  All other systems were reviewed and are negative.  Physical Exam: BP 142/84  Pulse 56  Ht 5\' 6"  (1.676 m)  Wt 122 lb 12.8 oz (55.702 kg)  BMI 19.83 kg/m2 Patient is very pleasant and in no acute distress. Little unsteady on her feet. Skin is warm and dry. Color is normal.  HEENT is unremarkable. Normocephalic/atraumatic. PERRL. Sclera are nonicteric. Neck is supple. No masses. No JVD. Lungs are clear. Cardiac exam shows an irregular rate and rhythm. Abdomen is soft. Extremities are without edema. Gait and ROM are intact. No gross neurologic deficits noted.  LABORATORY DATA:  Lab Results  Component Value Date   WBC 7.6 03/23/2012   HGB 13.6 03/23/2012   HCT 41.5 03/23/2012   PLT 222 03/23/2012   GLUCOSE 121* 05/31/2012   NA 140 05/31/2012   K 4.3 05/31/2012   CL 100 05/31/2012   CREATININE 1.1 05/31/2012   BUN 17 05/31/2012   CO2 34* 05/31/2012   TSH 0.645 03/23/2012   INR 1.09 03/24/2012    Assessment / Plan: 1. Chronic atrial fib - managed with rate control and anticoagulation with Pradaxa - doing ok. No falls.   2. Diastolic heart failure with well preserved LV function - looks fairly well compensated and overall seems to be ok. Encouraged her to maintain her activity. Will get her handicap sticker renewed.   3. HTN - BP is ok. She is monitoring at home.   She is seeing Dr. Waynard Hensley back in August. We will see her back in October. Continue with  salt restrictions and current medicines. We will be available as needed and otherwise see her in October.   Patient is agreeable to this plan and will call if any problems develop in the interim.   Sydney Macadamia, RN, ANP-C [facility] [address] [place], [place]  [address]

## 2012-08-10 ENCOUNTER — Telehealth: Payer: Self-pay | Admitting: Cardiology

## 2012-08-10 NOTE — Telephone Encounter (Signed)
Disability Pla-Card mailed to pt home Address 08/10/12/km

## 2012-10-25 ENCOUNTER — Other Ambulatory Visit: Payer: Self-pay | Admitting: Nurse Practitioner

## 2012-12-02 ENCOUNTER — Other Ambulatory Visit: Payer: Self-pay | Admitting: Nurse Practitioner

## 2013-02-01 ENCOUNTER — Ambulatory Visit (INDEPENDENT_AMBULATORY_CARE_PROVIDER_SITE_OTHER): Payer: Medicare Other | Admitting: Cardiology

## 2013-02-01 ENCOUNTER — Encounter: Payer: Self-pay | Admitting: Cardiology

## 2013-02-01 VITALS — BP 150/69 | HR 56 | Ht 66.0 in | Wt 129.0 lb

## 2013-02-01 DIAGNOSIS — I10 Essential (primary) hypertension: Secondary | ICD-10-CM

## 2013-02-01 DIAGNOSIS — I509 Heart failure, unspecified: Secondary | ICD-10-CM

## 2013-02-01 DIAGNOSIS — I4891 Unspecified atrial fibrillation: Secondary | ICD-10-CM

## 2013-02-01 MED ORDER — LOSARTAN POTASSIUM 50 MG PO TABS: 50.0000 mg | ORAL_TABLET | Freq: Two times a day (BID) | ORAL | Status: DC

## 2013-02-01 NOTE — Progress Notes (Signed)
HPI The patient presents for evaluation of atrial fibrillation and diastolic heart failure.  Since I last saw her she has had no new cardiovascular complaints. She does describe a dry hacking cough.  The patient denies any new symptoms such as chest discomfort, neck or arm discomfort. There has been no new shortness of breath, PND or orthopnea. There have been no reported palpitations, presyncope or syncope. She thinks her breathing is improved compared with previous.  No Known Allergies  Current Outpatient Prescriptions  Medication Sig Dispense Refill  . dabigatran (PRADAXA) 150 MG CAPS Take 1 capsule (150 mg total) by mouth 2 (two) times daily.  60 capsule    . furosemide (LASIX) 40 MG tablet Take 0.5 tablets (20 mg total) by mouth daily.  30 tablet  6  . ibandronate (BONIVA) 150 MG tablet Take 150 mg by mouth every 30 (thirty) days. Take in the morning with a full glass of water, on an empty stomach, and do not take anything else by mouth or lie down for the next 30 min.      . latanoprost (XALATAN) 0.005 % ophthalmic solution       . lisinopril (PRINIVIL,ZESTRIL) 20 MG tablet Take 1 tablet (20 mg total) by mouth 2 (two) times daily.  60 tablet  6  . metoprolol (LOPRESSOR) 50 MG tablet TAKE ONE TABLET BY MOUTH TWICE DAILY  60 tablet  5  . potassium chloride (K-DUR,KLOR-CON) 10 MEQ tablet TAKE THREE TABLETS BY MOUTH DAILY  90 tablet  4  . pravastatin (PRAVACHOL) 20 MG tablet Take 20 mg by mouth at bedtime.      . [DISCONTINUED] potassium chloride (K-DUR) 10 MEQ tablet Take 3 tablets (30 mEq total) by mouth daily.  90 tablet  3   No current facility-administered medications for this visit.    Past Medical History  Diagnosis Date  . Glaucoma   . Hypertension   . Dysrhythmia   . Atrial fibrillation   . CHF (congestive heart failure)     Diastolic dysfunction    Past Surgical History  Procedure Laterality Date  . Abdominal hysterectomy      ROS:  As stated in the history of  present illness and negative for all other systems.  PHYSICAL EXAM BP 150/69  Pulse 56  Ht 5\' 6"  (1.676 m)  Wt 129 lb (58.514 kg)  BMI 20.83 kg/m2 GENERAL:  Frail appearing, no distress HEENT:  Pupils equal round and reactive, fundi not visualized, oral mucosa unremarkable NECK:  No JVD, waveform within normal limits, carotid upstroke brisk and symmetric, no bruits, no thyromegaly LYMPHATICS:  No cervical, inguinal adenopathy LUNGS:  Clear to auscultation bilaterally BACK:  No CVA tenderness CHEST:  Unremarkable HEART:  PMI not displaced or sustained,S1 and S2 within normal limits, no S3,no clicks, no rubs, no murmurs, irregular ABD:  Flat, positive bowel sounds normal in frequency in pitch, no bruits, no rebound, no guarding, no midline pulsatile mass, no hepatomegaly, no splenomegaly EXT:  2 plus pulses throughout, no cyanosis no clubbing, left greater than right lower extremity edema moderate with left leg blistering SKIN:  No nodules, she does have any eruptive rash diffusely on her back NEURO:  Cranial nerves II through XII grossly intact, motor grossly intact throughout PSYCH:  Cognitively intact, oriented to person place and time  ATRIAL FIB:  Atrial fibrillation, rate 56, axis within normal limits, interventricular conduction delay, left axis deviation, no acute ST-T wave changes. 02/01/2013  ASSESSMENT AND PLAN  Congestive heart  failure - She seems to be euvolemic.  At this point, no change in therapy is indicated.  We have reviewed salt and fluid restrictions.  No further cardiovascular testing is indicated.  COUGH:  This may be an ACE inhibitor cough. I will switch to Cozaar 50 mg twice a day and discontinue lisinopril.  Atrial fibrillation - Her rate seems to be controlled. She will continue with her current regimen.  Her rate is slow but she has no symptoms. If she does have increased fatigue or dizziness in the future we could back off on her beta blocker.  HTN:  Her  blood pressure is elevated. However, this will be treated in the context of treating the above.

## 2013-02-01 NOTE — Patient Instructions (Addendum)
Please stop your Lisinopril and start Cozzar Losartan 50 mg one twice a day Continue all other medications as listed.  Follow up in 6 months with Dr Antoine Poche.  You will receive a letter in the mail 2 months before you are due.  Please call us when you receive this letter to schedule your follow up appointment.

## 2013-04-22 ENCOUNTER — Inpatient Hospital Stay (HOSPITAL_COMMUNITY)
Admission: EM | Admit: 2013-04-22 | Discharge: 2013-04-26 | DRG: 291 | Disposition: A | Discharge status: Home Health | Payer: Medicare Other | Attending: Internal Medicine | Admitting: Internal Medicine

## 2013-04-22 ENCOUNTER — Encounter (HOSPITAL_COMMUNITY): Payer: Self-pay | Admitting: Emergency Medicine

## 2013-04-22 ENCOUNTER — Emergency Department (HOSPITAL_COMMUNITY): Payer: Medicare Other

## 2013-04-22 DIAGNOSIS — Z87891 Personal history of nicotine dependence: Secondary | ICD-10-CM

## 2013-04-22 DIAGNOSIS — I5033 Acute on chronic diastolic (congestive) heart failure: Principal | ICD-10-CM | POA: Diagnosis present

## 2013-04-22 DIAGNOSIS — L0390 Cellulitis, unspecified: Secondary | ICD-10-CM

## 2013-04-22 DIAGNOSIS — I4891 Unspecified atrial fibrillation: Secondary | ICD-10-CM

## 2013-04-22 DIAGNOSIS — L0291 Cutaneous abscess, unspecified: Secondary | ICD-10-CM

## 2013-04-22 DIAGNOSIS — J9600 Acute respiratory failure, unspecified whether with hypoxia or hypercapnia: Secondary | ICD-10-CM | POA: Diagnosis present

## 2013-04-22 DIAGNOSIS — I509 Heart failure, unspecified: Secondary | ICD-10-CM

## 2013-04-22 DIAGNOSIS — M7989 Other specified soft tissue disorders: Secondary | ICD-10-CM

## 2013-04-22 DIAGNOSIS — E785 Hyperlipidemia, unspecified: Secondary | ICD-10-CM

## 2013-04-22 DIAGNOSIS — J189 Pneumonia, unspecified organism: Secondary | ICD-10-CM

## 2013-04-22 DIAGNOSIS — L02419 Cutaneous abscess of limb, unspecified: Secondary | ICD-10-CM | POA: Diagnosis present

## 2013-04-22 DIAGNOSIS — H409 Unspecified glaucoma: Secondary | ICD-10-CM

## 2013-04-22 DIAGNOSIS — I10 Essential (primary) hypertension: Secondary | ICD-10-CM

## 2013-04-22 DIAGNOSIS — Z79899 Other long term (current) drug therapy: Secondary | ICD-10-CM

## 2013-04-22 LAB — PROTIME-INR
INR: 1.42 (ref 0.00–1.49)
Prothrombin Time: 17 seconds — ABNORMAL HIGH (ref 11.6–15.2)

## 2013-04-22 LAB — BASIC METABOLIC PANEL
CO2: 28 mEq/L (ref 19–32)
Calcium: 8.7 mg/dL (ref 8.4–10.5)
Chloride: 102 mEq/L (ref 96–112)
Creatinine, Ser: 0.93 mg/dL (ref 0.50–1.10)
Sodium: 139 mEq/L (ref 135–145)

## 2013-04-22 LAB — CBC WITH DIFFERENTIAL/PLATELET
Basophils Absolute: 0 10*3/uL (ref 0.0–0.1)
Eosinophils Absolute: 0 10*3/uL (ref 0.0–0.7)
HCT: 39.6 % (ref 36.0–46.0)
Lymphocytes Relative: 5 % — ABNORMAL LOW (ref 12–46)
MCH: 32 pg (ref 26.0–34.0)
MCHC: 33.1 g/dL (ref 30.0–36.0)
Monocytes Absolute: 1.1 10*3/uL — ABNORMAL HIGH (ref 0.1–1.0)
Neutro Abs: 11.8 10*3/uL — ABNORMAL HIGH (ref 1.7–7.7)
Neutrophils Relative %: 86 % — ABNORMAL HIGH (ref 43–77)
RDW: 13.6 % (ref 11.5–15.5)
WBC: 13.7 10*3/uL — ABNORMAL HIGH (ref 4.0–10.5)

## 2013-04-22 LAB — PRO B NATRIURETIC PEPTIDE: Pro B Natriuretic peptide (BNP): 5106 pg/mL — ABNORMAL HIGH (ref 0–450)

## 2013-04-22 LAB — TSH: TSH: 0.372 u[IU]/mL (ref 0.350–4.500)

## 2013-04-22 MED ORDER — FUROSEMIDE 40 MG PO TABS
40.0000 mg | ORAL_TABLET | Freq: Every day | ORAL | Status: DC
  Administered 2013-04-22 – 2013-04-23 (×2): 40 mg via ORAL
  Filled 2013-04-22 (×2): qty 1

## 2013-04-22 MED ORDER — DEXTROSE 5 % IV SOLN
1.0000 g | INTRAVENOUS | Status: DC
  Administered 2013-04-22 – 2013-04-24 (×3): 1 g via INTRAVENOUS
  Filled 2013-04-22 (×4): qty 1

## 2013-04-22 MED ORDER — LOSARTAN POTASSIUM 50 MG PO TABS
50.0000 mg | ORAL_TABLET | Freq: Two times a day (BID) | ORAL | Status: DC
  Administered 2013-04-22 – 2013-04-26 (×9): 50 mg via ORAL
  Filled 2013-04-22 (×10): qty 1

## 2013-04-22 MED ORDER — POTASSIUM CHLORIDE ER 10 MEQ PO TBCR
30.0000 meq | EXTENDED_RELEASE_TABLET | Freq: Every day | ORAL | Status: DC
  Administered 2013-04-22 – 2013-04-24 (×3): 30 meq via ORAL
  Filled 2013-04-22 (×3): qty 3

## 2013-04-22 MED ORDER — SODIUM CHLORIDE 0.9 % IV SOLN: 250.0000 mL | INTRAVENOUS | Status: DC | PRN

## 2013-04-22 MED ORDER — ACETAMINOPHEN 325 MG PO TABS
650.0000 mg | ORAL_TABLET | ORAL | Status: DC | PRN
  Filled 2013-04-22: qty 2

## 2013-04-22 MED ORDER — SIMVASTATIN 10 MG PO TABS
10.0000 mg | ORAL_TABLET | Freq: Every day | ORAL | Status: DC
  Administered 2013-04-22 – 2013-04-25 (×4): 10 mg via ORAL
  Filled 2013-04-22 (×5): qty 1

## 2013-04-22 MED ORDER — METOPROLOL TARTRATE 50 MG PO TABS
50.0000 mg | ORAL_TABLET | Freq: Two times a day (BID) | ORAL | Status: DC
  Administered 2013-04-22 – 2013-04-26 (×9): 50 mg via ORAL
  Filled 2013-04-22 (×10): qty 1

## 2013-04-22 MED ORDER — FUROSEMIDE 10 MG/ML IJ SOLN
40.0000 mg | Freq: Once | INTRAMUSCULAR | Status: AC
  Administered 2013-04-22: 15:00:00 40 mg via INTRAVENOUS
  Filled 2013-04-22: qty 4

## 2013-04-22 MED ORDER — SODIUM CHLORIDE 0.9 % IJ SOLN: 3.0000 mL | INTRAMUSCULAR | Status: DC | PRN

## 2013-04-22 MED ORDER — DABIGATRAN ETEXILATE MESYLATE 150 MG PO CAPS
150.0000 mg | ORAL_CAPSULE | Freq: Two times a day (BID) | ORAL | Status: DC
  Administered 2013-04-22 – 2013-04-26 (×9): 150 mg via ORAL
  Filled 2013-04-22 (×10): qty 1

## 2013-04-22 MED ORDER — SODIUM CHLORIDE 0.9 % IJ SOLN
3.0000 mL | Freq: Two times a day (BID) | INTRAMUSCULAR | Status: DC
  Administered 2013-04-22 – 2013-04-26 (×9): 3 mL via INTRAVENOUS

## 2013-04-22 MED ORDER — LATANOPROST 0.005 % OP SOLN
1.0000 [drp] | Freq: Two times a day (BID) | OPHTHALMIC | Status: DC
  Administered 2013-04-22 – 2013-04-26 (×5): 1 [drp] via OPHTHALMIC
  Filled 2013-04-22: qty 2.5

## 2013-04-22 MED ORDER — ONDANSETRON HCL 4 MG/2ML IJ SOLN: 4.0000 mg | Freq: Four times a day (QID) | INTRAMUSCULAR | Status: DC | PRN

## 2013-04-22 MED ORDER — VANCOMYCIN HCL 500 MG IV SOLR
500.0000 mg | Freq: Two times a day (BID) | INTRAVENOUS | Status: AC
  Administered 2013-04-22 – 2013-04-25 (×8): 500 mg via INTRAVENOUS
  Filled 2013-04-22 (×9): qty 500

## 2013-04-22 NOTE — ED Notes (Signed)
Family remains at bedside, aware she is waiting admission. Patient states she is feeling better with the O2

## 2013-04-22 NOTE — Progress Notes (Signed)
VASCULAR LAB PRELIMINARY  PRELIMINARY  PRELIMINARY  PRELIMINARY  Left lower extremity venous duplex  completed.    Preliminary report:  Left:  No evidence of DVT, superficial thrombosis, or Baker's cyst.   Shion Bluestein, RVT 04/22/2013, 8:55 AM

## 2013-04-22 NOTE — ED Notes (Signed)
PT c/o cough and sob on ambulation and when laying down x 3 days.  Last night noticed swelling and redness to L thigh.  Hx of chf and afib.

## 2013-04-22 NOTE — ED Notes (Signed)
Returned from  X-ray

## 2013-04-22 NOTE — H&P (Signed)
[facility] History and Physical  Sydney Hensley:[id number] DOB: 03/31/1928 DOA: 04/22/2013  Referring physician:  PCP: Ezequiel Kayser, MD  Specialists: Dr. Antoine Poche  Chief Complaint: Shortness of breath and leg swelling  HPI: Sydney Hensley is a 80 y.o. female  History of diastolic CHF, atrial fibrillation, hypertension that presents to the emergency department with shortness of breath that has been ongoing for approximately 3-4 days.  Patient also states she's had a cough which has been somewhat productive, but does not know the color of her sputum production. She states that she did not seek any medical advice she felt that this would resolve on its own. Patient states she also noted that she was 140 pounds yesterday and today on her scale at home. She says she is normally 135 pounds. Although the ER patient is she weighed 129 pounds.  Patient states she is unable to wear her support hose/stockings due to her leg swelling.  Patient is also complained of some orthopnea as well as shortness of breath, with some PND this morning. She is comfortable sitting in an upright position. Patient also noted that she had a rash on her left upper thigh which occurred overnight. She denies any trauma falls or injuries to this area. She denies any bruising or bleeding. Patient states she's been doing a lot of medications, including her Pradaxa.  Patient denies any chest pain, palpitations, dizziness.  Review of Systems:  Constitutional: Denies fever, chills, diaphoresis, appetite change and fatigue.  HEENT: Denies photophobia, eye pain, redness, hearing loss, ear pain, congestion, sore throat, rhinorrhea, sneezing, mouth sores, trouble swallowing, neck pain, neck stiffness and tinnitus.   Respiratory: Admits to shortness of breath with orthopnea as well as cough.  Cardiovascular: Denies chest pain, palpitations.  Complains of leg swelling  Gastrointestinal: Denies nausea, vomiting, abdominal pain,  diarrhea, constipation, blood in stool and abdominal distention.  Genitourinary: Denies dysuria, urgency, frequency, hematuria, flank pain and difficulty urinating.  Musculoskeletal: Denies myalgias, back pain, joint swelling, arthralgias and gait problem.  Skin: Complains of rash and redness on her left upper thigh.  Neurological: Denies dizziness, seizures, syncope, weakness, light-headedness, numbness and headaches.  Hematological: Denies adenopathy. Easy bruising, personal or family bleeding history  Psychiatric/Behavioral: Denies suicidal ideation, mood changes, confusion, nervousness, sleep disturbance and agitation  Past Medical History  Diagnosis Date  . Glaucoma   . Hypertension   . Dysrhythmia   . Atrial fibrillation   . CHF (congestive heart failure)     Diastolic dysfunction   Past Surgical History  Procedure Laterality Date  . Abdominal hysterectomy     Social History:  reports that she has quit smoking. She has never used smokeless tobacco. She reports that she does not drink alcohol or use illicit drugs. Lives at home by herself.  No Known Allergies  Family History  Problem Relation Age of Onset  . Cancer Father   . CVA Mother     Prior to Admission medications   Medication Sig Start Date End Date Taking? Authorizing Provider  dabigatran (PRADAXA) 150 MG CAPS Take 1 capsule (150 mg total) by mouth 2 (two) times daily. 04/26/12  Yes Rosalio Macadamia, NP  furosemide (LASIX) 40 MG tablet Take 40 mg by mouth daily.   Yes Historical Provider, MD  ibandronate (BONIVA) 150 MG tablet Take 150 mg by mouth every 30 (thirty) days. Take in the morning with a full glass of water, on an empty stomach, and do not take anything else by mouth  or lie down for the next 30 min.   Yes Historical Provider, MD  latanoprost (XALATAN) 0.005 % ophthalmic solution Place 1 drop into both eyes 2 (two) times daily.  01/21/13  Yes Historical Provider, MD  losartan (COZAAR) 50 MG tablet Take 1  tablet (50 mg total) by mouth 2 (two) times daily. 02/01/13  Yes Rollene Rotunda, MD  metoprolol (LOPRESSOR) 50 MG tablet Take 50 mg by mouth 2 (two) times daily.   Yes Historical Provider, MD  potassium chloride (K-DUR) 10 MEQ tablet Take 30 mEq by mouth daily.   Yes Historical Provider, MD  pravastatin (PRAVACHOL) 20 MG tablet Take 20 mg by mouth at bedtime.   Yes Historical Provider, MD   Physical Exam: Filed Vitals:   04/22/13 1003  BP: 108/54  Pulse: 73  Temp:   Resp: 20     General: Well developed, well nourished, NAD, appears stated age  HEENT: NCAT, PERRLA, EOMI, Anicteic Sclera, mucous membranes moist. No pharyngeal erythema or exudates  Neck: Supple, no JVD, no masses  Cardiovascular: S1 S2 auscultated, irregular no rubs, murmurs or gallops.   Respiratory: Diminished breath sounds noted throughout all lung fields particularly at the bases.  Abdomen: Soft, nontender, nondistended, + bowel sounds  Extremities: warm dry without cyanosis clubbing, 2+ pitting edema lower extremities bilaterally.  Areas of erythema noted on left medial thigh.  Neuro: AAOx3, cranial nerves grossly intact. Strength 5/5 in patient's upper and lower extremities bilaterally  Skin: Without rashes exudates or nodules  Psych: Normal affect and demeanor with intact judgement and insight  Labs on Admission:  Basic Metabolic Panel:  Recent Labs Lab 04/22/13 0741  NA 139  K 3.6  CL 102  CO2 28  GLUCOSE 109*  BUN 21  CREATININE 0.93  CALCIUM 8.7   Liver Function Tests: No results found for this basename: AST, ALT, ALKPHOS, BILITOT, PROT, ALBUMIN,  in the last 168 hours No results found for this basename: LIPASE, AMYLASE,  in the last 168 hours No results found for this basename: AMMONIA,  in the last 168 hours CBC:  Recent Labs Lab 04/22/13 0741  WBC 13.7*  NEUTROABS 11.8*  HGB 13.1  HCT 39.6  MCV 96.8  PLT 146*   Cardiac Enzymes: No results found for this basename: CKTOTAL,  CKMB, CKMBINDEX, TROPONINI,  in the last 168 hours  BNP (last 3 results)  Recent Labs  04/22/13 0741  PROBNP 5106.0*   CBG: No results found for this basename: GLUCAP,  in the last 168 hours  Radiological Exams on Admission: Dg Chest 2 View  04/22/2013   CLINICAL DATA:  Shortness of breath.  EXAM: CHEST  2 VIEW  COMPARISON:  03/22/2012.  FINDINGS: Mediastinum and hilar structures are normal. Bibasilar pulmonary infiltrates with bilateral pleural effusions are noted. Small density previously noted in the right upper lobe has largely cleared. No evidence of pneumothorax. Heart size and pulmonary vascularity normal. No acute bony abnormality. Degenerative changes thoracic spine.  IMPRESSION: Bibasilar pulmonary infiltrates consistent with pneumonia. Small bilateral pleural effusions. Similar findings are noted on prior study of 03/22/2012. A component of these findings could be related to scarring however recurrent or persistent pneumonia with pleural effusions is suspected. If these findings do not clear on subsequent chest x-ray, chest CT may prove useful for further evaluation.   Electronically Signed   By: Maisie Fus  Register   On: 04/22/2013 09:10    EKG: Independently reviewed. Atrial fibrillation, rate of 84  Assessment/Plan Principal Problem:  CAP (community acquired pneumonia) Active Problems:   Cellulitis   Pneumonia   Acute exacerbation of CHF (congestive heart failure)  Acute dyspnea secondary to multifactorial causes, including possible CHF exacerbation versus community-acquired pneumonia Will admit patient to telemetry unit. Will place her on daily weights, strict intake and output, fluid restriction. We'll continue her home medication of Lasix. Also place her on antibiotics for community-acquired pneumonia, with vancomycin and cefepime. Patient be placed on these medications due to cellulitis of the left thigh as well. Will order urine and strep pneumonia urine antigen as well  as sputum culture and Gram stain. Blood cultures are currently pending. Will place patient on oxygen via nasal cannula to maintain oxygen saturations above 92%.  Possible acute exacerbation of CHF, diastolic Will give patient 40 mg of IV Lasix once. Will continue patient's home medications including her Lasix. Again we'll keep patient on fluid restriction, monitor her daily weights, monitor input and output. May consider cardiology consult if patient does not improve. BNP was elevated at 5000. We'll also obtain echocardiogram. Last echocardiogram conducted in November 2013 shows an EF of 55% with preserved systolic function however diastolic function cannot be accurately assessed due to atrial fibrillation.  Community-acquired pneumonia Treatment and plan as noted above  Left thigh cellulitis Will place patient on vancomycin with pharmacy to dose and follow. Patient denies any trauma to this area. We'll continue to monitor.  Atrial fibrillation Currently rate controlled. Will continue metoprolol as well as Pradaxa.  Hypertension Currently controlled, we'll continue metoprolol as well as losartan with holding parameters. Will also continue her Lasix.  Hyperlipidemia Will continue her statin  DVT prophylaxis: Pradaxa  Code Status: Full  Condition: Guarded  Family Communication: Family at bedside. Admission, patients condition and plan of care including tests being ordered have been discussed with the patient and family who indicate understanding and agree with the plan and Code Status.  Disposition Plan: Admitted, possibly stay approximately 2-3 days.   Time spent: 60 minutes  Krzysztof Reichelt D.O. [facility] Pager [phone]  If 7PM-7AM, please contact night-coverage [email or web address] Password [facility] 04/22/2013, 10:49 AM

## 2013-04-22 NOTE — ED Notes (Signed)
ECG given to Dr. Fayrene Fearing with older copy.

## 2013-04-22 NOTE — ED Notes (Signed)
Report called to [phone] 

## 2013-04-22 NOTE — Progress Notes (Signed)
ANTIBIOTIC CONSULT NOTE - INITIAL  Pharmacy Consult for vancomycin and cefepime Indication: pneumonia, cellulitis  No Known Allergies  Patient Measurements: Height: 5\' 6"  (167.6 cm) IBW/kg (Calculated) : 59.3 Weight 58.5 kg  Vital Signs: Temp: 98.2 F (36.8 C) (12/05 0858) Temp src: Oral (12/05 0858) BP: 108/54 mmHg (12/05 1003) Pulse Rate: 73 (12/05 1003) Intake/Output from previous day:   Intake/Output from this shift:    Labs:  Recent Labs  04/22/13 0741  WBC 13.7*  HGB 13.1  PLT 146*  CREATININE 0.93   The CrCl is unknown because both a height and weight (above a minimum accepted value) are required for this calculation. No results found for this basename: VANCOTROUGH, VANCOPEAK, VANCORANDOM, GENTTROUGH, GENTPEAK, GENTRANDOM, TOBRATROUGH, TOBRAPEAK, TOBRARND, AMIKACINPEAK, AMIKACINTROU, AMIKACIN,  in the last 72 hours   Microbiology: No results found for this or any previous visit (from the past 720 hour(s)).  Medical History: Past Medical History  Diagnosis Date  . Glaucoma   . Hypertension   . Dysrhythmia   . Atrial fibrillation   . CHF (congestive heart failure)     Diastolic dysfunction    Medications:  See med history Assessment: 79 yo lady with c/o SOB and cough.  Was awakened last night with pain and swelling in L upper thigh.  Her SrCr 0.93 and CrCl ~50 ml/min  Goal of Therapy:  Vancomycin trough level 15-20 mcg/ml  Plan:  Vancomycin 500 mg IV q12 hours Cefepime 1 gm IV q24 hours. F/u renal function, cultures and clinical course.  Timoty Bourke Poteet 04/22/2013,10:16 AM

## 2013-04-22 NOTE — Progress Notes (Signed)
Utilization Review Completed Marwa Fuhrman J. Sylvan Lahm, RN, BSN, NCM [phone]  

## 2013-04-22 NOTE — ED Provider Notes (Signed)
CSN: [id number]     Arrival date & time 04/22/13  0707 History   First MD Initiated Contact with Patient 04/22/13 [phone]     Chief Complaint  Patient presents with  . Shortness of Breath  . Leg Swelling    HPI  This is a 79-year-old female. Primary care is Dr. Earlene Plater at [facility].   Dr. Antoine Poche is her cardiologist.  She has a history of A. fib anticoagulated with pradaxa. She has a history of CHF on home Lasix. She states that her dry weight is about 135/37. She's been up about 140 yesterday and today on her scale, however she is 129 here.  She has had orthopnea at night. This morning awakened with some PND. She is comfortable sitting upright. Is also noticed her left leg is swollen she has tender erythema on the left upper medial thigh. No known falls or injuries. No other areas of bruising or bleeding. No recent change in her medications. Still taking all her medications. Her dietary indiscretions. No chest pain. No palpitations or syncope or presyncope. Normal urine output.    Past Medical History  Diagnosis Date  . Glaucoma   . Hypertension   . Dysrhythmia   . Atrial fibrillation   . CHF (congestive heart failure)     Diastolic dysfunction   Past Surgical History  Procedure Laterality Date  . Abdominal hysterectomy     Family History  Problem Relation Age of Onset  . Cancer Father   . CVA Mother    History  Substance Use Topics  . Smoking status: Former Games developer  . Smokeless tobacco: Never Used  . Alcohol Use: No   OB History   Grav Para Term Preterm Abortions TAB SAB Ect Mult Living                 Review of Systems  Constitutional: Positive for fatigue. Negative for fever, chills, diaphoresis and appetite change.  HENT: Negative for mouth sores, sore throat and trouble swallowing.   Eyes: Negative for visual disturbance.  Respiratory: Positive for shortness of breath. Negative for cough, chest tightness and wheezing.   Cardiovascular: Positive for leg  swelling. Negative for chest pain.       PND and orthopnea  Gastrointestinal: Negative for nausea, vomiting, abdominal pain, diarrhea and abdominal distention.  Endocrine: Negative for polydipsia, polyphagia and polyuria.  Genitourinary: Negative for dysuria, frequency and hematuria.  Musculoskeletal: Negative for gait problem.  Skin: Negative for color change, pallor and rash.  Neurological: Negative for dizziness, syncope, light-headedness and headaches.  Hematological: Bruises/bleeds easily.  Psychiatric/Behavioral: Negative for behavioral problems and confusion.    Allergies  Review of patient's allergies indicates no known allergies.  Home Medications   Current Outpatient Rx  Name  Route  Sig  Dispense  Refill  . dabigatran (PRADAXA) 150 MG CAPS   Oral   Take 1 capsule (150 mg total) by mouth 2 (two) times daily.   60 capsule      . furosemide (LASIX) 40 MG tablet   Oral   Take 40 mg by mouth daily.         Marland Kitchen ibandronate (BONIVA) 150 MG tablet   Oral   Take 150 mg by mouth every 30 (thirty) days. Take in the morning with a full glass of water, on an empty stomach, and do not take anything else by mouth or lie down for the next 30 min.         . latanoprost (XALATAN)  0.005 % ophthalmic solution   Both Eyes   Place 1 drop into both eyes 2 (two) times daily.          Marland Kitchen losartan (COZAAR) 50 MG tablet   Oral   Take 1 tablet (50 mg total) by mouth 2 (two) times daily.   180 tablet   3   . metoprolol (LOPRESSOR) 50 MG tablet   Oral   Take 50 mg by mouth 2 (two) times daily.         . potassium chloride (K-DUR) 10 MEQ tablet   Oral   Take 30 mEq by mouth daily.         . pravastatin (PRAVACHOL) 20 MG tablet   Oral   Take 20 mg by mouth at bedtime.          BP 108/54  Pulse 73  Temp(Src) 98.2 F (36.8 C) (Oral)  Resp 20  Ht 5\' 6"  (1.676 m)  SpO2 97% Physical Exam  Constitutional: She is oriented to person, place, and time. She appears  well-developed and well-nourished. No distress.  She is conversant. She does not appear dyspneic at rest or conversation.  HENT:  Head: Normocephalic.  Eyes: Conjunctivae are normal. Pupils are equal, round, and reactive to light. No scleral icterus.  Neck: Normal range of motion. Neck supple. No thyromegaly present.  Cardiovascular: Normal rate and regular rhythm.  Exam reveals no gallop and no friction rub.   No murmur heard. Nature fibrillation. Irregularly irregular. Controlled ventricular response  Pulmonary/Chest: Effort normal and breath sounds normal. No respiratory distress. She has no wheezes. She has no rales.  Diminished bibasilar breath sounds posteriorly.  Abdominal: Soft. Bowel sounds are normal. She exhibits no distension. There is no tenderness. There is no rebound.  Musculoskeletal: Normal range of motion.  Neurological: She is alert and oriented to person, place, and time.  Skin: Skin is warm and dry. No rash noted.     Psychiatric: She has a normal mood and affect. Her behavior is normal.    ED Course  Procedures (including critical care time) Labs Review Labs Reviewed  BASIC METABOLIC PANEL - Abnormal; Notable for the following:    Glucose, Bld 109 (*)    GFR calc non Af Amer 55 (*)    GFR calc Af Amer 64 (*)    All other components within normal limits  PRO B NATRIURETIC PEPTIDE - Abnormal; Notable for the following:    Pro B Natriuretic peptide (BNP) 5106.0 (*)    All other components within normal limits  CBC WITH DIFFERENTIAL - Abnormal; Notable for the following:    WBC 13.7 (*)    Platelets 146 (*)    Neutrophils Relative % 86 (*)    Neutro Abs 11.8 (*)    Lymphocytes Relative 5 (*)    Monocytes Absolute 1.1 (*)    All other components within normal limits  PROTIME-INR - Abnormal; Notable for the following:    Prothrombin Time 17.0 (*)    All other components within normal limits  CULTURE, BLOOD (ROUTINE X 2)  CULTURE, BLOOD (ROUTINE X 2)    Imaging Review Dg Chest 2 View  04/22/2013   CLINICAL DATA:  Shortness of breath.  EXAM: CHEST  2 VIEW  COMPARISON:  03/22/2012.  FINDINGS: Mediastinum and hilar structures are normal. Bibasilar pulmonary infiltrates with bilateral pleural effusions are noted. Small density previously noted in the right upper lobe has largely cleared. No evidence of pneumothorax. Heart size and pulmonary vascularity  normal. No acute bony abnormality. Degenerative changes thoracic spine.  IMPRESSION: Bibasilar pulmonary infiltrates consistent with pneumonia. Small bilateral pleural effusions. Similar findings are noted on prior study of 03/22/2012. A component of these findings could be related to scarring however recurrent or persistent pneumonia with pleural effusions is suspected. If these findings do not clear on subsequent chest x-ray, chest CT may prove useful for further evaluation.   Electronically Signed   By: Maisie Fus  Register   On: 04/22/2013 09:10    EKG Interpretation    Date/Time:  Friday April 22 2013 07:14:27 EST Ventricular Rate:  78 PR Interval:    QRS Duration: 114 QT Interval:  406 QTC Calculation: 462 R Axis:   -86 Text Interpretation:  Atrial fibrillation Left axis deviation Inferior infarct , age undetermined Anterolateral infarct , age undetermined Abnormal ECG Confirmed by Fayrene Fearing  MD, Amed Datta ([id number]) on 04/22/2013 7:44:52 AM            MDM   1. Hypertension   2. Atrial fibrillation   3. Glaucoma   4. Cellulitis   5. Pneumonia    I discussed this with the patient's primary care physician Dr. [facility]. He plans on seeing the patient in hospital. However [facility] admissions [facility] deferred [facility]. Discussed case with Dr. Zada Finders .  Antibiotic choices discussed. Ordered vancomycin  and cefepime. She'll be admitted to    Roney Marion, MD 04/22/13 1032

## 2013-04-22 NOTE — ED Notes (Signed)
Patient presents to ed c/o sob and cough onset 2 days ago, states her weigh has been going up and down for the last several days. C/o increased cough however isn't able to cough anything up. Denies chest pain, states she was asleep last pm and was awaken by severe pain in her left upper thigh, states she noticed reddness and swelling to upper inner thigh. Warm and painful to touch. Bilateral pedal pulse present.

## 2013-04-23 DIAGNOSIS — I369 Nonrheumatic tricuspid valve disorder, unspecified: Secondary | ICD-10-CM

## 2013-04-23 DIAGNOSIS — E785 Hyperlipidemia, unspecified: Secondary | ICD-10-CM

## 2013-04-23 DIAGNOSIS — J189 Pneumonia, unspecified organism: Secondary | ICD-10-CM

## 2013-04-23 DIAGNOSIS — I509 Heart failure, unspecified: Secondary | ICD-10-CM

## 2013-04-23 LAB — BASIC METABOLIC PANEL
BUN: 29 mg/dL — ABNORMAL HIGH (ref 6–23)
CO2: 31 mEq/L (ref 19–32)
Chloride: 102 mEq/L (ref 96–112)
Creatinine, Ser: 0.99 mg/dL (ref 0.50–1.10)
GFR calc non Af Amer: 51 mL/min — ABNORMAL LOW (ref >90)
Glucose, Bld: 103 mg/dL — ABNORMAL HIGH (ref 70–99)
Potassium: 4.1 mEq/L (ref 3.5–5.1)
Sodium: 141 mEq/L (ref 135–145)

## 2013-04-23 LAB — LEGIONELLA ANTIGEN, URINE: Legionella Antigen, Urine: NEGATIVE

## 2013-04-23 MED ORDER — BUDESONIDE 0.25 MG/2ML IN SUSP
0.2500 mg | Freq: Two times a day (BID) | RESPIRATORY_TRACT | Status: DC
  Administered 2013-04-23 – 2013-04-26 (×6): 0.25 mg via RESPIRATORY_TRACT
  Filled 2013-04-23 (×8): qty 2

## 2013-04-23 MED ORDER — FUROSEMIDE 10 MG/ML IJ SOLN
40.0000 mg | Freq: Every day | INTRAMUSCULAR | Status: DC
  Administered 2013-04-23 – 2013-04-26 (×4): 40 mg via INTRAVENOUS
  Filled 2013-04-23 (×3): qty 4

## 2013-04-23 NOTE — Evaluation (Signed)
Physical Therapy Evaluation Patient Details Name: Sydney Hensley MRN: [id number] DOB: [birth year] Today's Date: 04/23/2013 Time: 7829-5621 PT Time Calculation (min): 17 min  PT Assessment / Plan / Recommendation History of Present Illness   Pt is a 79 y.o. Female adm secondary to SOB and LE swelling. PMH includes CHF, AFIB, HTN.    Clinical Impression  Pt adm due to the above. Presents with decreased independence with mobility secondary to deficits listed below. Pt will benefit from skilled PT to maximize independence with mobility and return to PLOF. Pt limited today secondary to pain in Lt LE. Pt highly motivated to return home upon acute D/C.     PT Assessment  Patient needs continued PT services    Follow Up Recommendations  No PT follow up;Supervision - Intermittent    Does the patient have the potential to tolerate intense rehabilitation      Barriers to Discharge Decreased caregiver support pt lives alone    Equipment Recommendations  Other (comment) (TBD)    Recommendations for Other Services     Frequency Min 3X/week    Precautions / Restrictions Precautions Precautions: Fall Precaution Comments: denies any falls  Restrictions Weight Bearing Restrictions: No   Pertinent Vitals/Pain 10/10 in Lt LE; RN notified.       Mobility  Bed Mobility Bed Mobility: Supine to Sit;Sitting - Scoot to Edge of Bed Supine to Sit: 6: Modified independent (Device/Increase time);With rails Sitting - Scoot to Edge of Bed: 7: Independent Details for Bed Mobility Assistance: pt required increased time secondary to pain in Lt LE Transfers Transfers: Stand to Sit;Sit to Stand Sit to Stand: From chair/3-in-1;From bed;With armrests;4: Min guard Stand to Sit: 4: Min guard;To chair/3-in-1;With armrests;With upper extremity assist Details for Transfer Assistance: min guard to steady and for safety; pt slightly unsteady secondary to pain in Lt LE; cues for hand placement and sequencing   Ambulation/Gait Ambulation/Gait Assistance: 4: Min assist Ambulation Distance (Feet): 16 Feet Assistive device: 1 person hand held assist Ambulation/Gait Assistance Details: limited due to pain in Lt LE with WB; handheld (A) and cues for safe technique;  pt with fwd flexed trunk; cues for upright posture  Gait Pattern: Decreased stance time - left;Decreased step length - right;Decreased stride length;Antalgic;Narrow base of support;Trunk flexed Gait velocity: decreased due to pain Stairs: No Wheelchair Mobility Wheelchair Mobility: No         PT Diagnosis: Difficulty walking;Acute pain  PT Problem List: Decreased mobility;Decreased balance;Decreased knowledge of use of DME;Pain PT Treatment Interventions: DME instruction;Gait training;Stair training;Functional mobility training;Therapeutic exercise;Therapeutic activities;Balance training;Neuromuscular re-education;Patient/family education     PT Goals(Current goals can be found in the care plan section) Acute Rehab PT Goals Patient Stated Goal: to go back home and not have pain in this leg PT Goal Formulation: With patient Time For Goal Achievement: 05/07/13 Potential to Achieve Goals: Good  Visit Information  Last PT Received On: 04/23/13 Assistance Needed: +1       Prior Functioning  Home Living Family/patient expects to be discharged to:: Private residence Living Arrangements: Alone Available Help at Discharge: Family;Available PRN/intermittently Type of Home: House Home Access: Stairs to enter [employer] of Steps: 2 Entrance Stairs-Rails: Right Home Layout: One level Home Equipment: None Additional Comments: pt has a tub shower and standard height toilets  Prior Function Level of Independence: Independent Comments: pt drives and has not requird AD  Communication Communication: HOH    Cognition  Cognition Arousal/Alertness: Awake/alert Behavior During Therapy: WFL for tasks  assessed/performed  Overall Cognitive Status: Within Functional Limits for tasks assessed    Extremity/Trunk Assessment Upper Extremity Assessment Upper Extremity Assessment: Overall WFL for tasks assessed Lower Extremity Assessment Lower Extremity Assessment: Overall WFL for tasks assessed Cervical / Trunk Assessment Cervical / Trunk Assessment: Normal   Balance Balance Balance Assessed: Yes Static Sitting Balance Static Sitting - Balance Support: Feet supported;No upper extremity supported Static Sitting - Level of Assistance: 7: Independent Static Standing Balance Static Standing - Balance Support: During functional activity;No upper extremity supported Static Standing - Level of Assistance: Other (comment) (min guard)  End of Session PT - End of Session Equipment Utilized During Treatment: Gait belt Activity Tolerance: Patient tolerated treatment well Patient left: in chair;with call bell/phone within reach Nurse Communication: Mobility status  GP     Donell Sievert, [place] [phone] 04/23/2013, 12:05 PM

## 2013-04-23 NOTE — Progress Notes (Signed)
  Echocardiogram 2D Echocardiogram has been performed.  Dally Oshel 04/23/2013, 11:35 AM 

## 2013-04-23 NOTE — Progress Notes (Signed)
[facility] PROGRESS NOTE  Filed Weights   04/22/13 1159 04/22/13 1213 04/23/13 0432  Weight: 63.231 kg (139 lb 6.4 oz) 63.231 kg (139 lb 6.4 oz) 62.5 kg (137 lb 12.6 oz)        Intake/Output Summary (Last 24 hours) at 04/23/13 1623 Last data filed at 04/23/13 1300  Gross per 24 hour  Intake   1180 ml  Output    850 ml  Net    330 ml     Assessment/Plan: 1-Acute resp failure due to CAP and diastolic heart failure exacerbation: -continue abx's for CAP -daily weight and close intake and output -will start IV lasix (patient with crackles, JVD and LE swelling). BNP 5000 -Echo with preserved EF -follow electrolytes and renal function  2-LLE Cellulitis: no induration or abscess on exam. LE duplex negative for DVT, SVT or baker cyst. -will continue abx's -apply Ice pads QID  3-CAP (community acquired pneumonia):no fever.  -continue current antibotics.  4-Acute exacerbation of CHF (congestive heart failure): as mentioned above -low sodium diet Daily weight and close intak-e and output -will start IV lasix -patient on ACE and B-blocker.  5-Hyperlipidemia: continue statins  6-Hypertension: stable and well controlled. Will continue current medication egimen  7-Atrial fibrillation: rate controlled. -continue metoprolol and pradaxa    Code Status: Full Family Communication: no family at bedside Disposition Plan: to be determine. Patient very motivated to return home at discharge   Consultants:  None   Procedures: ECHO: preserved EF, no wall motion abnormalities; mild diastolic heart failure by LVH. No significant valvular disease.  Antibiotics:  vanc and cefepime  HPI/Subjective: Afebrile, feeling slightly better, but still with SOB, LE edema and JVD on exam.  Objective: Filed Vitals:   04/23/13 0432 04/23/13 0555 04/23/13 1038 04/23/13 1357  BP:  109/47 112/62 123/84  Pulse:  93 80 77  Temp:  98.3 F (36.8 C)  97.8 F (36.6 C)  TempSrc:  Oral  Oral   Resp:  20  18  Height:      Weight: 62.5 kg (137 lb 12.6 oz)     SpO2:  100%  99%    Exam:  General: Alert, awake, oriented x 3, in mild distress due to SOB.  HEENT: No bruits, no goiter. Positive JVD Heart: S1 and S2, no rubs no gallops, no murmurs, rate controlled. 2+ bilaterally LE edema Lungs: positive wheezing, bibasilar crackles and scattered rhonchi Abdomen: Soft, nontender, nondistended, positive bowel sounds.  Neuro: Grossly intact, nonfocal.   Data Reviewed: Basic Metabolic Panel:  Recent Labs Lab 04/22/13 0741 04/22/13 1405 04/23/13 0351  NA 139  --  141  K 3.6  --  4.1  CL 102  --  102  CO2 28  --  31  GLUCOSE 109*  --  103*  BUN 21  --  29*  CREATININE 0.93  --  0.99  CALCIUM 8.7  --  8.4  MG  --  1.7  --    CBC:  Recent Labs Lab 04/22/13 0741  WBC 13.7*  NEUTROABS 11.8*  HGB 13.1  HCT 39.6  MCV 96.8  PLT 146*   BNP (last 3 results)  Recent Labs  04/22/13 0741  PROBNP 5106.0*    Recent Results (from the past 240 hour(s))  CULTURE, BLOOD (ROUTINE X 2)     Status: None   Collection Time    04/22/13  8:20 AM      Result Value Range Status   Specimen Description BLOOD RIGHT ANTECUBITAL  Final   Special Requests BOTTLES DRAWN AEROBIC AND ANAEROBIC   Final   Culture  Setup Time     Final   Value: 04/22/2013 12:38     Performed at [facility]   Culture     Final   Value:        BLOOD CULTURE RECEIVED NO GROWTH TO DATE CULTURE WILL BE HELD FOR 5 DAYS BEFORE ISSUING A FINAL NEGATIVE REPORT     Performed at [facility]   Report Status PENDING   Incomplete  CULTURE, BLOOD (ROUTINE X 2)     Status: None   Collection Time    04/22/13  8:30 AM      Result Value Range Status   Specimen Description BLOOD LEFT ANTECUBITAL   Final   Special Requests BOTTLES DRAWN AEROBIC AND ANAEROBIC   Final   Culture  Setup Time     Final   Value: 04/22/2013 12:37     Performed at [facility]   Culture     Final    Value:        BLOOD CULTURE RECEIVED NO GROWTH TO DATE CULTURE WILL BE HELD FOR 5 DAYS BEFORE ISSUING A FINAL NEGATIVE REPORT     Performed at [facility]   Report Status PENDING   Incomplete     Studies: Dg Chest 2 View  04/22/2013   CLINICAL DATA:  Shortness of breath.  EXAM: CHEST  2 VIEW  COMPARISON:  03/22/2012.  FINDINGS: Mediastinum and hilar structures are normal. Bibasilar pulmonary infiltrates with bilateral pleural effusions are noted. Small density previously noted in the right upper lobe has largely cleared. No evidence of pneumothorax. Heart size and pulmonary vascularity normal. No acute bony abnormality. Degenerative changes thoracic spine.  IMPRESSION: Bibasilar pulmonary infiltrates consistent with pneumonia. Small bilateral pleural effusions. Similar findings are noted on prior study of 03/22/2012. A component of these findings could be related to scarring however recurrent or persistent pneumonia with pleural effusions is suspected. If these findings do not clear on subsequent chest x-ray, chest CT may prove useful for further evaluation.   Electronically Signed   By: Maisie Fus  Register   On: 04/22/2013 09:10    Scheduled Meds: . budesonide (PULMICORT) nebulizer solution  0.25 mg Nebulization BID  . ceFEPime (MAXIPIME) IV  1 g Intravenous Q24H  . dabigatran  150 mg Oral BID  . furosemide  40 mg Intravenous Daily  . latanoprost  1 drop Both Eyes BID  . losartan  50 mg Oral BID  . metoprolol  50 mg Oral BID  . potassium chloride  30 mEq Oral Daily  . simvastatin  10 mg Oral q1800  . sodium chloride  3 mL Intravenous Q12H  . vancomycin  500 mg Intravenous Q12H   Continuous Infusions:   Time > 30 minutes  Oshua Mcconaha  [facility] Pager [phone]. If 8PM-8AM, please contact night-coverage at [email or web address], password [facility] 04/23/2013, 4:23 PM  LOS: 1 day

## 2013-04-24 LAB — BASIC METABOLIC PANEL
CO2: 32 mEq/L (ref 19–32)
Calcium: 8.4 mg/dL (ref 8.4–10.5)
Creatinine, Ser: 0.95 mg/dL (ref 0.50–1.10)
Glucose, Bld: 97 mg/dL (ref 70–99)
Potassium: 3.2 mEq/L — ABNORMAL LOW (ref 3.5–5.1)

## 2013-04-24 MED ORDER — POTASSIUM CHLORIDE ER 10 MEQ PO TBCR
40.0000 meq | EXTENDED_RELEASE_TABLET | Freq: Two times a day (BID) | ORAL | Status: DC
  Administered 2013-04-24 – 2013-04-26 (×4): 40 meq via ORAL
  Filled 2013-04-24 (×5): qty 4

## 2013-04-24 MED ORDER — LORAZEPAM 0.5 MG PO TABS
0.5000 mg | ORAL_TABLET | Freq: Once | ORAL | Status: AC
  Administered 2013-04-24: 03:00:00 0.5 mg via ORAL
  Filled 2013-04-24: qty 1

## 2013-04-24 MED ORDER — DIPHENHYDRAMINE HCL 25 MG PO CAPS
25.0000 mg | ORAL_CAPSULE | Freq: Once | ORAL | Status: AC
  Administered 2013-04-24: 23:00:00 25 mg via ORAL
  Filled 2013-04-24: qty 1

## 2013-04-24 NOTE — Progress Notes (Signed)
[facility] PROGRESS NOTE  Filed Weights   04/22/13 1213 04/23/13 0432 04/24/13 0516  Weight: 63.231 kg (139 lb 6.4 oz) 62.5 kg (137 lb 12.6 oz) 60.9 kg (134 lb 4.2 oz)        Intake/Output Summary (Last 24 hours) at 04/24/13 1732 Last data filed at 04/24/13 1602  Gross per 24 hour  Intake   1610 ml  Output   2325 ml  Net   -715 ml     Assessment/Plan: 1-Acute resp failure due to CAP and diastolic heart failure exacerbation: -continue abx's for CAP -daily weight and close intake and output -will continue IV lasix (patient with improved air movement and decrease crackles; also improvement in her LE swelling). BNP 5000 -Echo with preserved EF -5 pounds lighter and negative 2L apporx -follow electrolytes and replete as needed -follow renal function  2-LLE Cellulitis: no induration or abscess on exam. LE duplex negative for DVT, SVT or baker cyst. -will continue abx's -apply Ice pads QID and maintain extremities elevated   3-CAP (community acquired pneumonia):no fever.  -continue current antibiotics and PRN oxygen  4-Acute exacerbation of CHF (congestive heart failure): as mentioned above -continue low sodium diet -Daily weight and close intak-e and output -will continue IV lasix -patient on ACE and B-blocker already.  5-Hyperlipidemia: continue statins  6-Hypertension: stable and well controlled. Will continue current medication regimen and follow VS  7-Atrial fibrillation: rate controlled. -continue metoprolol and pradaxa    Code Status: Full Family Communication: no family at bedside Disposition Plan: to be determine. Patient very motivated to return home at discharge   Consultants:  None   Procedures: ECHO: preserved EF, no wall motion abnormalities; mild diastolic heart failure by LVH. No significant valvular disease.  Antibiotics:  vanc and cefepime  HPI/Subjective: Afebrile, feeling better and breathing better.  Objective: Filed Vitals:   04/24/13 0516 04/24/13 0847 04/24/13 1048 04/24/13 1344  BP: 130/66  116/58 106/58  Pulse: 85   69  Temp: 98.6 F (37 C)   98 F (36.7 C)  TempSrc: Oral   Oral  Resp: 20   18  Height:      Weight: 60.9 kg (134 lb 4.2 oz)     SpO2: 97% 99% 97% 99%    Exam:  General: Alert, awake, oriented x 3, in no distress   HEENT: No bruits, no goiter. Positive JVD Heart: S1 and S2, no rubs no gallops, no murmurs, rate controlled. 2+ bilaterally LE edema Lungs: no wheezing, improved air movement and decrease bibasilar crackles  Abdomen: Soft, nontender, nondistended, positive bowel sounds.  Neuro: Grossly intact, nonfocal.   Data Reviewed: Basic Metabolic Panel:  Recent Labs Lab 04/22/13 0741 04/22/13 1405 04/23/13 0351 04/24/13 0430  NA 139  --  141 137  K 3.6  --  4.1 3.2*  CL 102  --  102 98  CO2 28  --  31 32  GLUCOSE 109*  --  103* 97  BUN 21  --  29* 29*  CREATININE 0.93  --  0.99 0.95  CALCIUM 8.7  --  8.4 8.4  MG  --  1.7  --   --    CBC:  Recent Labs Lab 04/22/13 0741  WBC 13.7*  NEUTROABS 11.8*  HGB 13.1  HCT 39.6  MCV 96.8  PLT 146*   BNP (last 3 results)  Recent Labs  04/22/13 0741  PROBNP 5106.0*    Recent Results (from the past 240 hour(s))  CULTURE, BLOOD (ROUTINE X 2)  Status: None   Collection Time    04/22/13  8:20 AM      Result Value Range Status   Specimen Description BLOOD RIGHT ANTECUBITAL   Final   Special Requests BOTTLES DRAWN AEROBIC AND ANAEROBIC   Final   Culture  Setup Time     Final   Value: 04/22/2013 12:38     Performed at [facility]   Culture     Final   Value:        BLOOD CULTURE RECEIVED NO GROWTH TO DATE CULTURE WILL BE HELD FOR 5 DAYS BEFORE ISSUING A FINAL NEGATIVE REPORT     Performed at [facility]   Report Status PENDING   Incomplete  CULTURE, BLOOD (ROUTINE X 2)     Status: None   Collection Time    04/22/13  8:30 AM      Result Value Range Status   Specimen Description BLOOD LEFT  ANTECUBITAL   Final   Special Requests BOTTLES DRAWN AEROBIC AND ANAEROBIC   Final   Culture  Setup Time     Final   Value: 04/22/2013 12:37     Performed at [facility]   Culture     Final   Value:        BLOOD CULTURE RECEIVED NO GROWTH TO DATE CULTURE WILL BE HELD FOR 5 DAYS BEFORE ISSUING A FINAL NEGATIVE REPORT     Performed at [facility]   Report Status PENDING   Incomplete     Studies: No results found.  Scheduled Meds: . budesonide (PULMICORT) nebulizer solution  0.25 mg Nebulization BID  . ceFEPime (MAXIPIME) IV  1 g Intravenous Q24H  . dabigatran  150 mg Oral BID  . furosemide  40 mg Intravenous Daily  . latanoprost  1 drop Both Eyes BID  . losartan  50 mg Oral BID  . metoprolol  50 mg Oral BID  . potassium chloride  40 mEq Oral BID  . simvastatin  10 mg Oral q1800  . sodium chloride  3 mL Intravenous Q12H  . vancomycin  500 mg Intravenous Q12H   Continuous Infusions:   Time > 30 minutes  Sydney Hensley  [facility] Pager [phone]. If 8PM-8AM, please contact night-coverage at [email or web address], password [facility] 04/24/2013, 5:32 PM  LOS: 2 days

## 2013-04-24 NOTE — Progress Notes (Signed)
Patient restless and appears to be anxious.  Sydney Hensley notified and received order for one time dose of ativan.  Medication administered as ordered, will continue to monitor. Sydney Hensley

## 2013-04-25 LAB — BASIC METABOLIC PANEL
BUN: 23 mg/dL (ref 6–23)
Calcium: 8.7 mg/dL (ref 8.4–10.5)
GFR calc non Af Amer: 77 mL/min — ABNORMAL LOW (ref >90)
Glucose, Bld: 89 mg/dL (ref 70–99)

## 2013-04-25 MED ORDER — DIPHENHYDRAMINE HCL 25 MG PO CAPS: 50.0000 mg | ORAL_CAPSULE | Freq: Every evening | ORAL | Status: DC | PRN

## 2013-04-25 MED ORDER — ZOLPIDEM TARTRATE 5 MG PO TABS
5.0000 mg | ORAL_TABLET | Freq: Every evening | ORAL | Status: DC | PRN
  Administered 2013-04-26: 01:00:00 5 mg via ORAL
  Filled 2013-04-25: qty 1

## 2013-04-25 MED ORDER — DEXTROSE 5 % IV SOLN
1.0000 g | Freq: Two times a day (BID) | INTRAVENOUS | Status: AC
  Administered 2013-04-25 (×2): 1 g via INTRAVENOUS
  Filled 2013-04-25 (×2): qty 1

## 2013-04-25 MED ORDER — DOXYCYCLINE HYCLATE 100 MG PO TABS
100.0000 mg | ORAL_TABLET | Freq: Two times a day (BID) | ORAL | Status: DC
  Administered 2013-04-26: 10:00:00 100 mg via ORAL
  Filled 2013-04-25 (×2): qty 1

## 2013-04-25 NOTE — Progress Notes (Signed)
[facility] PROGRESS NOTE  Filed Weights   04/23/13 0432 04/24/13 0516 04/25/13 0414  Weight: 62.5 kg (137 lb 12.6 oz) 60.9 kg (134 lb 4.2 oz) 60.374 kg (133 lb 1.6 oz)        Intake/Output Summary (Last 24 hours) at 04/25/13 1350 Last data filed at 04/25/13 1155  Gross per 24 hour  Intake    820 ml  Output   1051 ml  Net   -231 ml     Assessment/Plan: 1-Acute resp failure due to CAP and diastolic heart failure exacerbation: -continue abx's for CAP; will plan to switch to doxycycline PO in am (12/9) -daily weight and close intake and output -will continue IV lasix X 1 more day (patient with improved air movement and decrease crackles; also improvement in her LE swelling). BNP 5000 on admisison -Echo with preserved EF -6 pounds lighter and negative 2.5L apporx -follow electrolytes and replete as needed -follow renal function  2-LLE Cellulitis: no induration or abscess on exam. LE duplex negative for DVT, SVT or baker cyst. -will continue abx's IV for today; start doxycycline PO in am (12/9) -continue applying Ice pads TID and maintain extremities elevated   3-CAP (community acquired pneumonia): no fever.  -continue current antibiotics and PRN oxygen -oxygen now titrated down to RA -will transition to PO doxycycline in am (12/9)  4-Acute exacerbation of CHF (congestive heart failure): as mentioned above -continue low sodium diet -Daily weight and close intak-e and output -will continue IV lasix one more day (still with signs of volume overload) -patient on ACE and B-blocker already.  5-Hyperlipidemia: continue statins  6-Hypertension: stable and well controlled. Will continue current medication regimen and follow VS  7-Atrial fibrillation: rate controlled. -continue metoprolol and pradaxa   Code Status: Full Family Communication:  Nephew at bedside Disposition Plan: to be determine. Patient very motivated to return home at discharge; will follow rec's from  physical therapy  Consultants:  None   Procedures: ECHO: preserved EF, no wall motion abnormalities; mild diastolic heart failure by LVH. No significant valvular disease.  Antibiotics:  vanc and cefepime  HPI/Subjective: Afebrile, feeling better and breathing a whole lot better.  Objective: Filed Vitals:   04/24/13 1344 04/24/13 2020 04/25/13 0414 04/25/13 0914  BP: 106/58 128/57 144/65   Pulse: 69 73 65   Temp: 98 F (36.7 C) 97.9 F (36.6 C) 97.7 F (36.5 C)   TempSrc: Oral Oral Oral   Resp: 18 18 18    Height:      Weight:   60.374 kg (133 lb 1.6 oz)   SpO2: 99% 100% 95% 97%    Exam:  General: Alert, awake, oriented x 3, in no distress   HEENT: No bruits, no goiter. No JVD Heart: S1 and S2, no rubs no gallops, no murmurs, rate controlled. 1-2+ bilaterally LE edema Lungs: no wheezing, improved air movement; still with some fine bibasilar crackles  Abdomen: Soft, nontender, nondistended, positive bowel sounds.  Neuro: Grossly intact, nonfocal.   Data Reviewed: Basic Metabolic Panel:  Recent Labs Lab 04/22/13 0741 04/22/13 1405 04/23/13 0351 04/24/13 0430 04/25/13 0555  NA 139  --  141 137 137  K 3.6  --  4.1 3.2* 3.7  CL 102  --  102 98 100  CO2 28  --  31 32 31  GLUCOSE 109*  --  103* 97 89  BUN 21  --  29* 29* 23  CREATININE 0.93  --  0.99 0.95 0.74  CALCIUM 8.7  --  8.4 8.4 8.7  MG  --  1.7  --   --   --    CBC:  Recent Labs Lab 04/22/13 0741  WBC 13.7*  NEUTROABS 11.8*  HGB 13.1  HCT 39.6  MCV 96.8  PLT 146*   BNP (last 3 results)  Recent Labs  04/22/13 0741  PROBNP 5106.0*    Recent Results (from the past 240 hour(s))  CULTURE, BLOOD (ROUTINE X 2)     Status: None   Collection Time    04/22/13  8:20 AM      Result Value Range Status   Specimen Description BLOOD RIGHT ANTECUBITAL   Final   Special Requests BOTTLES DRAWN AEROBIC AND ANAEROBIC   Final   Culture  Setup Time     Final   Value: 04/22/2013 12:38      Performed at [facility]   Culture     Final   Value:        BLOOD CULTURE RECEIVED NO GROWTH TO DATE CULTURE WILL BE HELD FOR 5 DAYS BEFORE ISSUING A FINAL NEGATIVE REPORT     Performed at [facility]   Report Status PENDING   Incomplete  CULTURE, BLOOD (ROUTINE X 2)     Status: None   Collection Time    04/22/13  8:30 AM      Result Value Range Status   Specimen Description BLOOD LEFT ANTECUBITAL   Final   Special Requests BOTTLES DRAWN AEROBIC AND ANAEROBIC   Final   Culture  Setup Time     Final   Value: 04/22/2013 12:37     Performed at [facility]   Culture     Final   Value:        BLOOD CULTURE RECEIVED NO GROWTH TO DATE CULTURE WILL BE HELD FOR 5 DAYS BEFORE ISSUING A FINAL NEGATIVE REPORT     Performed at [facility]   Report Status PENDING   Incomplete     Studies: No results found.  Scheduled Meds: . budesonide (PULMICORT) nebulizer solution  0.25 mg Nebulization BID  . ceFEPime (MAXIPIME) IV  1 g Intravenous Q12H  . dabigatran  150 mg Oral BID  . [START ON 04/26/2013] doxycycline  100 mg Oral Q12H  . furosemide  40 mg Intravenous Daily  . latanoprost  1 drop Both Eyes BID  . losartan  50 mg Oral BID  . metoprolol  50 mg Oral BID  . potassium chloride  40 mEq Oral BID  . simvastatin  10 mg Oral q1800  . sodium chloride  3 mL Intravenous Q12H  . vancomycin  500 mg Intravenous Q12H   Continuous Infusions:   Time > 30 minutes  Kanija Remmel  [facility] Pager [phone]. If 8PM-8AM, please contact night-coverage at [email or web address], password [facility] 04/25/2013, 1:50 PM  LOS: 3 days

## 2013-04-25 NOTE — Progress Notes (Signed)
Physical Therapy Treatment Patient Details Name: Sydney Hensley MRN: [id number] DOB: [birth year] Today's Date: 04/25/2013 Time: 7829-5621 PT Time Calculation (min): 28 min  PT Assessment / Plan / Recommendation  History of Present Illness Pt is an 79 yo female with CHF, CAP and L LE cellulitis, hx of afib and HTN controlled currently by meds. L LE venous duplex on 12/5 negative for DVT with some residual soreness.   PT Comments   Patient presents with some decreased knowledge of safety, and safe use of assistive device (RW). She tolerated treatment well today, and can benefit from continued PT services in order to address safety issues and increase strength and mobility as pain in left LE allows.  Follow Up Recommendations  Home health PT;Supervision - Intermittent     Does the patient have the potential to tolerate intense rehabilitation     Barriers to Discharge        Equipment Recommendations  Rolling walker with 5" wheels    Recommendations for Other Services    Frequency Min 3X/week   Progress towards PT Goals Progress towards PT goals: Progressing toward goals  Plan Current plan remains appropriate    Precautions / Restrictions Precautions Precautions: Fall Restrictions Weight Bearing Restrictions: No   Pertinent Vitals/Pain HR 67-72 at rest, 72-84 with activity.On RA with no complaint of dyspnea on exertion. Reports left LE pain that was not rated even when prompted.    Mobility  Bed Mobility Bed Mobility: Not assessed Details for Bed Mobility Assistance: pt seated in chair on arrival Transfers Transfers: Sit to Stand;Stand to Sit Sit to Stand: 5: Supervision;With upper extremity assist;From chair/3-in-1;From toilet Stand to Sit: 5: Supervision;To toilet;To chair/3-in-1;With upper extremity assist Details for Transfer Assistance: cues for UE support and for safety Ambulation/Gait Ambulation/Gait Assistance: 4: Min guard Ambulation Distance (Feet): 150  Feet Assistive device: Rolling walker Ambulation/Gait Assistance Details: cues needed to use RW, to stand inside walker, for safety Gait Pattern: Step-through pattern;Decreased stride length;Decreased weight shift to left Gait velocity: decreased due to pain General Gait Details: Pain in L LE with less weight bearing on that leg; unsteady and not using the RW safely or efficiently to offload L LE without multiple cues Stairs: Yes Stairs Assistance: 4: Min guard Stair Management Technique: One rail Left;Forwards Number of Stairs: 2    Exercises General Exercises - Lower Extremity Ankle Circles/Pumps: AROM;Both;20 reps;Seated (gravity eliminated) Hip ABduction/ADduction: AROM;Both;20 reps;Seated (gravity eliminated)   PT Diagnosis:    PT Problem List:   PT Treatment Interventions:     PT Goals (current goals can now be found in the care plan section) Acute Rehab PT Goals Patient Stated Goal: to go back home without pain in the leg PT Goal Formulation: With patient Time For Goal Achievement: 05/07/13 Potential to Achieve Goals: Good  Visit Information  Last PT Received On: 04/25/13 Assistance Needed: +1 History of Present Illness: Pt is an 79 yo female with CHF, CAP and L LE cellulitis, hx of afib and HTN controlled currently by meds. L LE venous duplex on 12/5 negative for DVT with some residual soreness.    Subjective Data  Subjective: "I'm very independent" Patient Stated Goal: to go back home without pain in the leg   Cognition  Cognition Arousal/Alertness: Awake/alert Behavior During Therapy: WFL for tasks assessed/performed Overall Cognitive Status: Within Functional Limits for tasks assessed    Balance     End of Session PT - End of Session Equipment Utilized During Treatment: Gait belt Activity Tolerance:  Patient tolerated treatment well Patient left: in chair;with call bell/phone within reach Nurse Communication: Mobility status   GP     Willette Pa,  SPT 04/25/2013, 2:23 PM

## 2013-04-25 NOTE — Progress Notes (Addendum)
ANTIBIOTIC CONSULT NOTE - FOLLOW UP  Pharmacy Consult for Vancomycin and Cefepime Indication: CAP and LE cellulitis  No Known Allergies  Patient Measurements: Height: 5\' 7"  (170.2 cm) Weight: 133 lb 1.6 oz (60.374 kg) (scale A) IBW/kg (Calculated) : 61.6 Adjusted Body Weight:   Vital Signs: Temp: 97.7 F (36.5 C) (12/08 0414) Temp src: Oral (12/08 0414) BP: 144/65 mmHg (12/08 0414) Pulse Rate: 65 (12/08 0414) Intake/Output from previous day: 12/07 0701 - 12/08 0700 In: 1550 [P.O.:1200; IV Piggyback:350] Out: 800 [Urine:800] Intake/Output from this shift: Total I/O In: 240 [P.O.:240] Out: -   Labs:  Recent Labs  04/23/13 0351 04/24/13 0430 04/25/13 0555  CREATININE 0.99 0.95 0.74   Estimated Creatinine Clearance: 50.8 ml/min (by C-G formula based on Cr of 0.74). No results found for this basename: VANCOTROUGH, Leodis Binet, VANCORANDOM, GENTTROUGH, GENTPEAK, GENTRANDOM, TOBRATROUGH, TOBRAPEAK, TOBRARND, AMIKACINPEAK, AMIKACINTROU, AMIKACIN,  in the last 72 hours   Microbiology: Recent Results (from the past 720 hour(s))  CULTURE, BLOOD (ROUTINE X 2)     Status: None   Collection Time    04/22/13  8:20 AM      Result Value Range Status   Specimen Description BLOOD RIGHT ANTECUBITAL   Final   Special Requests BOTTLES DRAWN AEROBIC AND ANAEROBIC   Final   Culture  Setup Time     Final   Value: 04/22/2013 12:38     Performed at [facility]   Culture     Final   Value:        BLOOD CULTURE RECEIVED NO GROWTH TO DATE CULTURE WILL BE HELD FOR 5 DAYS BEFORE ISSUING A FINAL NEGATIVE REPORT     Performed at [facility]   Report Status PENDING   Incomplete  CULTURE, BLOOD (ROUTINE X 2)     Status: None   Collection Time    04/22/13  8:30 AM      Result Value Range Status   Specimen Description BLOOD LEFT ANTECUBITAL   Final   Special Requests BOTTLES DRAWN AEROBIC AND ANAEROBIC   Final   Culture  Setup Time     Final   Value: 04/22/2013  12:37     Performed at [facility]   Culture     Final   Value:        BLOOD CULTURE RECEIVED NO GROWTH TO DATE CULTURE WILL BE HELD FOR 5 DAYS BEFORE ISSUING A FINAL NEGATIVE REPORT     Performed at [facility]   Report Status PENDING   Incomplete    Anti-infectives   Start     Dose/Rate Route Frequency Ordered Stop   04/22/13 1030  ceFEPIme (MAXIPIME) 1 g in dextrose 5 % 50 mL IVPB     1 g 100 mL/hr over 30 Minutes Intravenous Every 24 hours 04/22/13 1023     04/22/13 1030  vancomycin (VANCOCIN) 500 mg in sodium chloride 0.9 % 100 mL IVPB     500 mg 100 mL/hr over 60 Minutes Intravenous Every 12 hours 04/22/13 1023        Assessment: 85yof on Vancomycin and Cefepime Day 3 for suspected CAP and LE cellulitis. Patient is currently afebrile and cultures have reported NGTD. Per MD notes, cellulitis is improving. Patient's SCr has also improved since admission. Will adjust Cefepime dosing based on improving renal function and plan to check Vancomycin trough with tonight's dose if continued. - CrCl 51 ml/min  Goal of Therapy:  Vancomycin trough level 15-20 mcg/ml  Plan:  1. Continue Vancomycin 500mg  IV q12h - check trough tonight if continued 2. Change Cefepime to 1g IV q12h 3. Monitor renal function, UOP, cultures and clinical course 4. Consider narrowing and/or transitioning to oral antibiotic regimen if clinically appropriate  Cleon Dew [phone] 04/25/2013,10:13 AM   Addendum: Per notes, MD plans to transition to PO Doxycycline in the AM - will not check Vancomycin trough unless antibiotic plans change (new orders and stop dates already placed by MD).  Wilfred Lacy, PharmD Clinical Pharmacist [phone] 04/25/2013, 2:24 PM

## 2013-04-25 NOTE — Progress Notes (Signed)
Seen and agree with SPT note Brandon Scarbrough Tabor Owen Pagnotta, PT [phone]  

## 2013-04-26 MED ORDER — HYDROCOD POLST-CHLORPHEN POLST 10-8 MG/5ML PO LQCR
5.0000 mL | Freq: Once | ORAL | Status: AC
  Administered 2013-04-26: 5 mL via ORAL
  Filled 2013-04-26: qty 5

## 2013-04-26 MED ORDER — FUROSEMIDE 40 MG PO TABS: 60.0000 mg | ORAL_TABLET | Freq: Every day | ORAL | Status: DC

## 2013-04-26 MED ORDER — DOXYCYCLINE HYCLATE 100 MG PO TABS: 100.0000 mg | ORAL_TABLET | Freq: Two times a day (BID) | ORAL | Status: AC

## 2013-04-26 MED ORDER — POTASSIUM CHLORIDE ER 10 MEQ PO TBCR: 40.0000 meq | EXTENDED_RELEASE_TABLET | Freq: Every day | ORAL | Status: DC

## 2013-04-26 NOTE — Progress Notes (Signed)
Patient reqested something to help her sleep. Notified on-call MD. Ambien was given per PRN order. Also patient states she feels like she is trying to cough up phlegm (correct sp.) Notified on-call MD.

## 2013-04-26 NOTE — Care Management Note (Addendum)
  Page 2 of 2   04/26/2013     1:20:45 PM   CARE MANAGEMENT NOTE 04/26/2013  Patient:  Sydney Hensley, Sydney Hensley   Account Number:  [id number]  Date Initiated:  04/26/2013  Documentation initiated by:  [facility]  Subjective/Objective Assessment:   79 y.o. female  Hx of diastolic CHF, atrial fibrillation, hypertension that presents to the emergency department with shortness of breath//Home alone     Action/Plan:   40 mg of IV Lasix once, fluid restriction, monitor her daily weights, monitor input and output.//home with HH   Anticipated DC Date:  04/26/2013   Anticipated DC Plan:  HOME W HOME HEALTH SERVICES      DC Planning Services  CM consult      Choice offered to / List presented to:  C-1 Patient        HH arranged  HH-1 RN  HH-10 DISEASE MANAGEMENT  HH-2 PT  HH-3 OT      HH agency  [facility]   Status of service:  Completed, signed off Medicare Important Message given?   (If response is "NO", the following Medicare IM given date fields will be blank) Date Medicare IM given:   Date Additional Medicare IM given:    Discharge Disposition:    Per UR Regulation:    If discussed at Long Length of Stay Meetings, dates discussed:    Comments:  04/26/13 1015 Oletta Cohn, RN, BSN, [employer] [phone] Spoke with pt at bedside regarding discharge planning for [facility]. Offered pt list of home health agencies to choose from.  Pt chose [facility] to render services of RN/PT/OT. Kizzie Furnish, RN of [facility] notified.  No DME needs identified at this time.

## 2013-04-26 NOTE — Progress Notes (Signed)
Physician Discharge Summary   Sydney Hensley MRN:[id number] DOB: 09/22/1927 DOA: 04/22/2013  PCP: PERINI,MARK A, MD  Admit date: 04/22/2013  Discharge date: 04/26/2013  Time spent: >30 minutes  Recommendations for Outpatient Follow-up:  1. BMET to follow electrolytes and renal function 2. Reassess BP and adjust meds if needed BNP    Component  Value  Date/Time    PROBNP  5181.0*  04/26/2013 1040    Filed Weights    04/24/13 0516  04/25/13 0414  04/26/13 0636   Weight:  60.9 kg (134 lb 4.2 oz)  60.374 kg (133 lb 1.6 oz)  58.8 kg (129 lb 10.1 oz)    Discharge Diagnoses:  Principal Problem:  CAP (community acquired pneumonia)  Active Problems:  Atrial fibrillation  Hypertension  Cellulitis  Pneumonia  Acute exacerbation of CHF (congestive heart failure)  Hyperlipidemia   Discharge Condition: stable and improved. No SOB, no CP and with good O2 sat on RA. Patient still with some LE swelling, but no JVD or crackles. Lasix adjusted for better control of her volume, advise to follow low sodium diet and will finish abx's regimen by mouth as instructed for PNA and cellulitis.  Diet recommendation: low sodium heart healthy diet  History of present illness:  79 y.o. female History of diastolic CHF, atrial fibrillation, hypertension that presents to the emergency department with shortness of breath that has been ongoing for approximately 3-4 days. Patient also states she's had a cough which has been somewhat productive, but does not know the color of her sputum production. She states that she did not seek any medical advice she felt that this would resolve on its own. Patient states she also noted that she was 140 pounds yesterday and today on her scale at home. She says she is normally 135 pounds. Although the ER patient is she weighed 129 pounds. Patient states she is unable to wear her support hose/stockings due to her leg swelling. Patient is also complained of some orthopnea as well as shortness  of breath, with some PND this morning. She is comfortable sitting in an upright position. Patient also noted that she had a rash on her left upper thigh which occurred overnight. She denies any trauma falls or injuries to this area. She denies any bruising or bleeding. Patient states she's been doing a lot of medications, including her Pradaxa. Patient denies any chest pain, palpitations, dizziness.  Hospital Course:  1-Acute resp failure due to CAP and diastolic heart failure exacerbation:  -continue abx's for CAP; will switch to doxycycline PO and complete a total of 10 days. At discharge 7 more days pending.  -daily weight and low sodium diet  -will will discharge on 60 of lasix for better control  -follow up with PCP in 1 week  -Echo with preserved EF  -6 pounds lighter and negative 3 L apporx  -follow electrolytes with BMET at follow up visit  -continue maintenance potassium  2-LLE Cellulitis: no induration or abscess on exam. LE duplex negative for DVT, SVT or baker cyst.  -will discharge with physical measures instruction and doxycycline as mentioned above.  3-CAP (community acquired pneumonia): no fever.  -continue current antibiotics as mentioned above  -good air movement and O2 sat on RA  4-Acute exacerbation of CHF (congestive heart failure): as mentioned above.  -continue low sodium diet  -Daily weight and close intak-e and output  -will discharge on 60 of lasix  -patient on ACE and B-blocker already.  5-Hyperlipidemia: continue statins  6-Hypertension:   stable and well controlled. Will follow with PCP for further adjustments as needed  7-Atrial fibrillation: rate controlled.  -continue metoprolol and pradaxa  Procedures:  See below for x-ray reports  ECHO: preserved EF, no wall motion abnormalities; mild diastolic heart failure by LVH. No significant valvular disease. Consultations:  None  Discharge Exam:  Filed Vitals:    04/26/13 0943   BP:    Pulse:  75   Temp:     Resp:     General: NAD, afebrile; good air movement and no CP or SOB  Cardiovascular: regular rate, no rubs or gallops; soft SEM  Respiratory: CTA bilaterally  Abdomen: soft, NT, ND, positive BS  Extremities: still with 1 plus edema bilaterally and left Leg with some erythema in her inner thigh, extending to her knee. No induration or fluctuation appreciated on exam.  Discharge Instructions  Discharge Orders    Future Appointments  Provider  Department  Dept Phone    08/01/2013 9:30 AM  James Hochrein, MD  [facility] [address] [facility]  [phone]    Future Orders  Complete By  Expires    Diet - low sodium heart healthy  As directed     Discharge instructions  As directed     Comments:    Take medications as prescribed  Follow low sodium diet (less than 2000 mg daily)  Arrange follow up with PCP in 1 week  Follow instructions and recommendations from home health staff        Medication List         dabigatran 150 MG Caps capsule    Commonly known as: PRADAXA    Take 1 capsule (150 mg total) by mouth 2 (two) times daily.    doxycycline 100 MG tablet    Commonly known as: VIBRA-TABS    Take 1 tablet (100 mg total) by mouth every 12 (twelve) hours.    furosemide 40 MG tablet    Commonly known as: LASIX    Take 1.5 tablets (60 mg total) by mouth daily.    ibandronate 150 MG tablet    Commonly known as: BONIVA    Take 150 mg by mouth every 30 (thirty) days. Take in the morning with a full glass of water, on an empty stomach, and do not take anything else by mouth or lie down for the next 30 min.    latanoprost 0.005 % ophthalmic solution    Commonly known as: XALATAN    Place 1 drop into both eyes 2 (two) times daily.    losartan 50 MG tablet    Commonly known as: COZAAR    Take 1 tablet (50 mg total) by mouth 2 (two) times daily.    metoprolol 50 MG tablet    Commonly known as: LOPRESSOR    Take 50 mg by mouth 2 (two) times daily.    potassium chloride 10 MEQ tablet     Commonly known as: K-DUR    Take 4 tablets (40 mEq total) by mouth daily.    pravastatin 20 MG tablet    Commonly known as: PRAVACHOL    Take 20 mg by mouth at bedtime.      No Known Allergies      Follow-up Information    Follow up with PERINI,MARK A, MD. Schedule an appointment as soon as possible for a visit in 1 week.    Specialty: Internal Medicine    Contact information:    [address]  [place] [address]    [phone]       The results of significant diagnostics from this hospitalization (including imaging, microbiology, ancillary and laboratory) are listed below for reference.   Significant Diagnostic Studies:  Dg Chest 2 View  04/22/2013 CLINICAL DATA: Shortness of breath. EXAM: CHEST 2 VIEW COMPARISON: 03/22/2012. FINDINGS: Mediastinum and hilar structures are normal. Bibasilar pulmonary infiltrates with bilateral pleural effusions are noted. Small density previously noted in the right upper lobe has largely cleared. No evidence of pneumothorax. Heart size and pulmonary vascularity normal. No acute bony abnormality. Degenerative changes thoracic spine. IMPRESSION: Bibasilar pulmonary infiltrates consistent with pneumonia. Small bilateral pleural effusions. Similar findings are noted on prior study of 03/22/2012. A component of these findings could be related to scarring however recurrent or persistent pneumonia with pleural effusions is suspected. If these findings do not clear on subsequent chest x-ray, chest CT may prove useful for further evaluation. Electronically Signed By: Thomas Register On: 04/22/2013 09:10  Microbiology:  Recent Results (from the past 240 hour(s))   CULTURE, BLOOD (ROUTINE X 2) Status: None    Collection Time    04/22/13 8:20 AM   Result  Value  Range  Status    Specimen Description  BLOOD RIGHT ANTECUBITAL   Final    Special Requests  BOTTLES DRAWN AEROBIC AND ANAEROBIC 10MLS   Final    Culture Setup Time    Final    Value:  04/22/2013 12:38      Performed at [facility]    Culture    Final    Value:  BLOOD CULTURE RECEIVED NO GROWTH TO DATE CULTURE WILL BE HELD FOR 5 DAYS BEFORE ISSUING A FINAL NEGATIVE REPORT     Performed at [facility]    Report Status  PENDING   Incomplete   CULTURE, BLOOD (ROUTINE X 2) Status: None    Collection Time    04/22/13 8:30 AM   Result  Value  Range  Status    Specimen Description  BLOOD LEFT ANTECUBITAL   Final    Special Requests  BOTTLES DRAWN AEROBIC AND ANAEROBIC 10MLS   Final    Culture Setup Time    Final    Value:  04/22/2013 12:37     Performed at [facility]    Culture    Final    Value:  BLOOD CULTURE RECEIVED NO GROWTH TO DATE CULTURE WILL BE HELD FOR 5 DAYS BEFORE ISSUING A FINAL NEGATIVE REPORT     Performed at [facility]    Report Status  PENDING   Incomplete    Labs:  Basic Metabolic Panel:   Recent Labs  Lab  04/22/13 0741  04/22/13 1405  04/23/13 0351  04/24/13 0430  04/25/13 0555   NA  139  --  141  137  137   K  3.6  --  4.1  3.2*  3.7   CL  102  --  102  98  100   CO2  28  --  31  32  31   GLUCOSE  109*  --  103*  97  89   BUN  21  --  29*  29*  23   CREATININE  0.93  --  0.99  0.95  0.74   CALCIUM  8.7  --  8.4  8.4  8.7   MG  --  1.7  --  --  --    CBC:   Recent Labs  Lab  04/22/13 0741   

## 2013-04-26 NOTE — Progress Notes (Signed)
Seen and agree with SPT note Chung Chagoya Tabor Ginelle Bays, PT [phone]  

## 2013-04-26 NOTE — Progress Notes (Signed)
Pt is being discharged home with home health. Family is transporting the patient home. Pt has been provided with discharge instructions. RN answered all questions the patient had.

## 2013-04-26 NOTE — Progress Notes (Signed)
Physical Therapy Treatment Patient Details Name: Sydney Hensley MRN: [id number] DOB: Jun 19, 1927 Today's Date: 04/26/2013 Time: 1308-6578 PT Time Calculation (min): 27 min  PT Assessment / Plan / Recommendation  History of Present Illness Pt is an 79 yo female with CHF, CAP and L LE cellulitis, hx of afib and HTN controlled currently by meds. L LE venous duplex on 12/5 negative for DVT with some residual soreness.   PT Comments   Pt tolerated treatment well today. Was sleeping and lethargic upon entering (given sleep aid) but willing to work with [place] as she is eager to return home. Walked with RW and min guard needing cues for safety only. Will continue to follow.  Follow Up Recommendations  Home health PT;Supervision - Intermittent     Does the patient have the potential to tolerate intense rehabilitation     Barriers to Discharge        Equipment Recommendations  Rolling walker with 5" wheels    Recommendations for Other Services    Frequency     Progress towards PT Goals Progress towards PT goals: Progressing toward goals  Plan Current plan remains appropriate    Precautions / Restrictions Precautions Precautions: Fall   Pertinent Vitals/Pain SpO2 98% on RA at rest, 94-98% on RA with activity.  Pt continues to report L LE soreness (not rated.)    Mobility  Bed Mobility Bed Mobility: Not assessed Details for Bed Mobility Assistance: pt seated in chair on arrival Transfers Sit to Stand: 5: Supervision;With upper extremity assist;From chair/3-in-1 Stand to Sit: 5: Supervision;To chair/3-in-1;With upper extremity assist Details for Transfer Assistance: cues for UE support and sequence to ease transition x 3 for transfer training Ambulation/Gait Ambulation/Gait Assistance: 4: Min guard Ambulation Distance (Feet): 150 Feet Assistive device: Rolling walker Ambulation/Gait Assistance Details: cues for posture and position in RW as well as continued use of RW for safety Gait  Pattern: Step-through pattern;Decreased stride length;Trunk flexed Gait velocity: decreased Stairs: Yes Stairs Assistance: 4: Min guard Stairs Assistance Details (indicate cue type and reason): cues for safety Stair Management Technique: One rail Left;Forwards;Step to pattern Number of Stairs: 2    Exercises General Exercises - Lower Extremity Long Arc Quad: AROM;Seated;Both;10 reps Hip Flexion/Marching: AROM;Seated;Both;10 reps   PT Diagnosis:    PT Problem List:   PT Treatment Interventions:     PT Goals (current goals can now be found in the care plan section)    Visit Information  Last PT Received On: 04/26/13 Assistance Needed: +1 History of Present Illness: Pt is an 79 yo female with CHF, CAP and L LE cellulitis, hx of afib and HTN controlled currently by meds. L LE venous duplex on 12/5 negative for DVT with some residual soreness.    Subjective Data      Cognition  Cognition Arousal/Alertness: Lethargic Behavior During Therapy: Flat affect Overall Cognitive Status: Within Functional Limits for tasks assessed    Balance     End of Session PT - End of Session Equipment Utilized During Treatment: Gait belt Activity Tolerance: Patient tolerated treatment well Patient left: in chair;with call bell/phone within reach   GP     Willette Pa, SPT 04/26/2013, 9:51 AM

## 2013-04-26 NOTE — Progress Notes (Signed)
Patient had a coughing spell in the middle of the night. Patient requested something for coughing. Notified on-call MD. Orders given for medication PRN for cough and was administered per PRN order. Medication was effective. Will continue to monitor to end of shift.

## 2013-04-26 NOTE — Discharge Summary (Signed)
Physician Discharge Summary   Sydney Hensley:[id number] DOB: 1928/03/17 DOA: 04/22/2013  PCP: Ezequiel Kayser, MD  Admit date: 04/22/2013  Discharge date: 04/26/2013  Time spent: >30 minutes  Recommendations for Outpatient Follow-up:  1. BMET to follow electrolytes and renal function 2. Reassess BP and adjust meds if needed BNP    Component  Value  Date/Time    PROBNP  5181.0*  04/26/2013 1040    Filed Weights    04/24/13 0516  04/25/13 0414  04/26/13 0636   Weight:  60.9 kg (134 lb 4.2 oz)  60.374 kg (133 lb 1.6 oz)  58.8 kg (129 lb 10.1 oz)    Discharge Diagnoses:  Principal Problem:  CAP (community acquired pneumonia)  Active Problems:  Atrial fibrillation  Hypertension  Cellulitis  Pneumonia  Acute exacerbation of CHF (congestive heart failure)  Hyperlipidemia   Discharge Condition: stable and improved. No SOB, no CP and with good O2 sat on RA. Patient still with some LE swelling, but no JVD or crackles. Lasix adjusted for better control of her volume, advise to follow low sodium diet and will finish abx's regimen by mouth as instructed for PNA and cellulitis.  Diet recommendation: low sodium heart healthy diet  History of present illness:  79 y.o. female History of diastolic CHF, atrial fibrillation, hypertension that presents to the emergency department with shortness of breath that has been ongoing for approximately 3-4 days. Patient also states she's had a cough which has been somewhat productive, but does not know the color of her sputum production. She states that she did not seek any medical advice she felt that this would resolve on its own. Patient states she also noted that she was 140 pounds yesterday and today on her scale at home. She says she is normally 135 pounds. Although the ER patient is she weighed 129 pounds. Patient states she is unable to wear her support hose/stockings due to her leg swelling. Patient is also complained of some orthopnea as well as shortness  of breath, with some PND this morning. She is comfortable sitting in an upright position. Patient also noted that she had a rash on her left upper thigh which occurred overnight. She denies any trauma falls or injuries to this area. She denies any bruising or bleeding. Patient states she's been doing a lot of medications, including her Pradaxa. Patient denies any chest pain, palpitations, dizziness.  Hospital Course:  1-Acute resp failure due to CAP and diastolic heart failure exacerbation:  -continue abx's for CAP; will switch to doxycycline PO and complete a total of 10 days. At discharge 7 more days pending.  -daily weight and low sodium diet  -will will discharge on 60 of lasix for better control  -follow up with PCP in 1 week  -Echo with preserved EF  -6 pounds lighter and negative 3 L apporx  -follow electrolytes with BMET at follow up visit  -continue maintenance potassium  2-LLE Cellulitis: no induration or abscess on exam. LE duplex negative for DVT, SVT or baker cyst.  -will discharge with physical measures instruction and doxycycline as mentioned above.  3-CAP (community acquired pneumonia): no fever.  -continue current antibiotics as mentioned above  -good air movement and O2 sat on RA  4-Acute exacerbation of CHF (congestive heart failure): as mentioned above.  -continue low sodium diet  -Daily weight and close intak-e and output  -will discharge on 60 of lasix  -patient on ACE and B-blocker already.  5-Hyperlipidemia: continue statins  6-Hypertension:  stable and well controlled. Will follow with PCP for further adjustments as needed  7-Atrial fibrillation: rate controlled.  -continue metoprolol and pradaxa  Procedures:  See below for x-ray reports  ECHO: preserved EF, no wall motion abnormalities; mild diastolic heart failure by LVH. No significant valvular disease. Consultations:  None  Discharge Exam:  Filed Vitals:    04/26/13 0943   BP:    Pulse:  75   Temp:     Resp:     General: NAD, afebrile; good air movement and no CP or SOB  Cardiovascular: regular rate, no rubs or gallops; soft SEM  Respiratory: CTA bilaterally  Abdomen: soft, NT, ND, positive BS  Extremities: still with 1 plus edema bilaterally and left Leg with some erythema in her inner thigh, extending to her knee. No induration or fluctuation appreciated on exam.  Discharge Instructions  Discharge Orders    Future Appointments  Provider  Department  Dept Phone    08/01/2013 9:30 AM  Rollene Rotunda, MD  [facility] [place] [facility]  [phone]    Future Orders  Complete By  Expires    Diet - low sodium heart healthy  As directed     Discharge instructions  As directed     Comments:    Take medications as prescribed  Follow low sodium diet (less than 2000 mg daily)  Arrange follow up with PCP in 1 week  Follow instructions and recommendations from home health staff        Medication List         dabigatran 150 MG Caps capsule    Commonly known as: PRADAXA    Take 1 capsule (150 mg total) by mouth 2 (two) times daily.    doxycycline 100 MG tablet    Commonly known as: VIBRA-TABS    Take 1 tablet (100 mg total) by mouth every 12 (twelve) hours.    furosemide 40 MG tablet    Commonly known as: LASIX    Take 1.5 tablets (60 mg total) by mouth daily.    ibandronate 150 MG tablet    Commonly known as: BONIVA    Take 150 mg by mouth every 30 (thirty) days. Take in the morning with a full glass of water, on an empty stomach, and do not take anything else by mouth or lie down for the next 30 min.    latanoprost 0.005 % ophthalmic solution    Commonly known as: XALATAN    Place 1 drop into both eyes 2 (two) times daily.    losartan 50 MG tablet    Commonly known as: COZAAR    Take 1 tablet (50 mg total) by mouth 2 (two) times daily.    metoprolol 50 MG tablet    Commonly known as: LOPRESSOR    Take 50 mg by mouth 2 (two) times daily.    potassium chloride 10 MEQ tablet     Commonly known as: K-DUR    Take 4 tablets (40 mEq total) by mouth daily.    pravastatin 20 MG tablet    Commonly known as: PRAVACHOL    Take 20 mg by mouth at bedtime.      No Known Allergies      Follow-up Information    Follow up with PERINI,MARK A, MD. Schedule an appointment as soon as possible for a visit in 1 week.    Specialty: Internal Medicine    Contact information:    [address] [place] [address]  [phone]       The results of significant diagnostics from this hospitalization (including imaging, microbiology, ancillary and laboratory) are listed below for reference.   Significant Diagnostic Studies:  Dg Chest 2 View  04/22/2013 CLINICAL DATA: Shortness of breath. EXAM: CHEST 2 VIEW COMPARISON: 03/22/2012. FINDINGS: Mediastinum and hilar structures are normal. Bibasilar pulmonary infiltrates with bilateral pleural effusions are noted. Small density previously noted in the right upper lobe has largely cleared. No evidence of pneumothorax. Heart size and pulmonary vascularity normal. No acute bony abnormality. Degenerative changes thoracic spine. IMPRESSION: Bibasilar pulmonary infiltrates consistent with pneumonia. Small bilateral pleural effusions. Similar findings are noted on prior study of 03/22/2012. A component of these findings could be related to scarring however recurrent or persistent pneumonia with pleural effusions is suspected. If these findings do not clear on subsequent chest x-ray, chest CT may prove useful for further evaluation. Electronically Signed By: Maisie Fus Register On: 04/22/2013 09:10  Microbiology:  Recent Results (from the past 240 hour(s))   CULTURE, BLOOD (ROUTINE X 2) Status: None    Collection Time    04/22/13 8:20 AM   Result  Value  Range  Status    Specimen Description  BLOOD RIGHT ANTECUBITAL   Final    Special Requests  BOTTLES DRAWN AEROBIC AND ANAEROBIC   Final    Culture Setup Time    Final    Value:  04/22/2013 12:38      Performed at [facility]    Culture    Final    Value:  BLOOD CULTURE RECEIVED NO GROWTH TO DATE CULTURE WILL BE HELD FOR 5 DAYS BEFORE ISSUING A FINAL NEGATIVE REPORT     Performed at [facility]    Report Status  PENDING   Incomplete   CULTURE, BLOOD (ROUTINE X 2) Status: None    Collection Time    04/22/13 8:30 AM   Result  Value  Range  Status    Specimen Description  BLOOD LEFT ANTECUBITAL   Final    Special Requests  BOTTLES DRAWN AEROBIC AND ANAEROBIC   Final    Culture Setup Time    Final    Value:  04/22/2013 12:37     Performed at [facility]    Culture    Final    Value:  BLOOD CULTURE RECEIVED NO GROWTH TO DATE CULTURE WILL BE HELD FOR 5 DAYS BEFORE ISSUING A FINAL NEGATIVE REPORT     Performed at [facility]    Report Status  PENDING   Incomplete    Labs:  Basic Metabolic Panel:   Recent Labs  Lab  04/22/13 0741  04/22/13 1405  04/23/13 0351  04/24/13 0430  04/25/13 0555   NA  139  --  141  137  137   K  3.6  --  4.1  3.2*  3.7   CL  102  --  102  98  100   CO2  28  --  31  32  31   GLUCOSE  109*  --  103*  97  89   BUN  21  --  29*  29*  23   CREATININE  0.93  --  0.99  0.95  0.74   CALCIUM  8.7  --  8.4  8.4  8.7   MG  --  1.7  --  --  --    CBC:   Recent Labs  Lab  04/22/13 0741  WBC  13.7*   NEUTROABS  11.8*   HGB  13.1   HCT  39.6   MCV  96.8   PLT  146*    BNP:  BNP (last 3 results)   Recent Labs   04/22/13 0741  04/26/13 1040   PROBNP  5106.0*  5181.0*    Signed:  Ludwin Flahive  [facility]  04/26/2013, 12:16 PM

## 2013-04-28 LAB — CULTURE, BLOOD (ROUTINE X 2): Culture: NO GROWTH

## 2013-06-22 ENCOUNTER — Ambulatory Visit (INDEPENDENT_AMBULATORY_CARE_PROVIDER_SITE_OTHER)
Admission: RE | Admit: 2013-06-22 | Discharge: 2013-06-22 | Disposition: A | Discharge status: Home / Self Care | Payer: Medicare Other | Source: Ambulatory Visit

## 2013-06-22 ENCOUNTER — Other Ambulatory Visit (HOSPITAL_COMMUNITY): Payer: Self-pay | Admitting: Internal Medicine

## 2013-06-22 ENCOUNTER — Ambulatory Visit (HOSPITAL_COMMUNITY)
Admission: RE | Admit: 2013-06-22 | Discharge: 2013-06-22 | Disposition: A | Discharge status: Home / Self Care | Payer: Medicare Other | Source: Ambulatory Visit | Attending: Vascular Surgery | Admitting: Vascular Surgery

## 2013-06-22 DIAGNOSIS — R0989 Other specified symptoms and signs involving the circulatory and respiratory systems: Secondary | ICD-10-CM

## 2013-06-22 DIAGNOSIS — I739 Peripheral vascular disease, unspecified: Secondary | ICD-10-CM | POA: Insufficient documentation

## 2013-06-28 ENCOUNTER — Other Ambulatory Visit: Payer: Self-pay | Admitting: Cardiology

## 2013-06-29 ENCOUNTER — Ambulatory Visit (INDEPENDENT_AMBULATORY_CARE_PROVIDER_SITE_OTHER): Payer: Medicare Other | Admitting: Podiatry

## 2013-06-29 ENCOUNTER — Encounter: Payer: Self-pay | Admitting: Podiatry

## 2013-06-29 VITALS — BP 136/80 | HR 64 | Resp 12

## 2013-06-29 DIAGNOSIS — L259 Unspecified contact dermatitis, unspecified cause: Secondary | ICD-10-CM

## 2013-06-29 NOTE — Progress Notes (Signed)
   Subjective:    Patient ID: Sydney Hensley, female    DOB: 09/30/1927, 80 y.o.   MRN: [id number]  HPI '' RT FOOT HEEL HAVE OPEN SORE AND NOT SURE HOW LONG ITS BEEN. TRIED NO TREATMENT.''  Patient has a history of cellulitis in the right lower extremity treated with oral antibiotics for approximately a month according to patient's daughter. Patient said arterial Doppler on 06/22/2013. The treating Dr. for the cellulitis thought that the fissure in the right heel may been the source or cellulitis and sent here for evaluation. Patient states that she does have an existing prescription for Bactroban ointment. There's been no wound care to the right heel fissure.   Review of Systems  Respiratory: Positive for shortness of breath.   Cardiovascular: Positive for leg swelling.  All other systems reviewed and are negative.       Objective:   Physical Exam  80 year old white female orientated x3, presents with her daughter  Vascular: The DP and PTs are 0/4 bilaterally. Chart review demonstrates a lower extremity Doppler done on 06/22/2013 with normal perfusion of the lower extremities bilaterally. There is some mention of decrease flow at the TBI level.   Dermatological: The right lower extremity demonstrates a low-grade edema with slight erythema. There is a fissure in the right posterior heel without any active drainage, malodor, erythema or warmth.  Musculoskeletal HAV deformity present     Assessment & Plan:   Assessment:  Resolving cellulitis right lower extremity  Noninfected skin fissure right heel without any obvious proximate cause to the lower extremity cellulitis  Plan: Will have patient use existing Bactroban ointment and applied to the skin fissure 3 times a day and cover with gauze pad or Band-Aid.

## 2013-06-29 NOTE — Patient Instructions (Signed)
Apply existing Bactroban ointment 3 times a day to the crack on the right heel. Cover with a Band-Aid or cotton sock.

## 2013-08-01 ENCOUNTER — Ambulatory Visit: Payer: Medicare Other | Admitting: Cardiology

## 2013-09-15 ENCOUNTER — Other Ambulatory Visit: Payer: Self-pay | Admitting: Cardiology

## 2013-09-16 ENCOUNTER — Encounter: Payer: Self-pay | Admitting: Cardiology

## 2013-09-16 ENCOUNTER — Ambulatory Visit (INDEPENDENT_AMBULATORY_CARE_PROVIDER_SITE_OTHER): Payer: Medicare Other | Admitting: Cardiology

## 2013-09-16 VITALS — BP 130/74 | HR 61 | Ht 67.0 in | Wt 140.0 lb

## 2013-09-16 DIAGNOSIS — I509 Heart failure, unspecified: Secondary | ICD-10-CM

## 2013-09-16 DIAGNOSIS — I4891 Unspecified atrial fibrillation: Secondary | ICD-10-CM

## 2013-09-16 NOTE — Progress Notes (Signed)
HPI The patient presents for evaluation of atrial fibrillation and diastolic heart failure.  Since I last saw her she says that she has had increasing SOB.  She does appear to have an 11 lb weight gain since the last visit.   The patient denies any new symptoms such as chest discomfort, neck or arm discomfort. There has been no new PND or orthopnea. There have been no reported palpitations, presyncope or syncope. She does try to avoid salt but it is likely that she could do better.    No Known Allergies  Current Outpatient Prescriptions  Medication Sig Dispense Refill  . dabigatran (PRADAXA) 150 MG CAPS Take 1 capsule (150 mg total) by mouth 2 (two) times daily.  60 capsule    . furosemide (LASIX) 40 MG tablet Take 1.5 tablets (60 mg total) by mouth daily.  45 tablet  1  . ibandronate (BONIVA) 150 MG tablet Take 150 mg by mouth every 30 (thirty) days. Take in the morning with a full glass of water, on an empty stomach, and do not take anything else by mouth or lie down for the next 30 min.      . latanoprost (XALATAN) 0.005 % ophthalmic solution Place 1 drop into both eyes 2 (two) times daily.       Marland Kitchen. losartan (COZAAR) 50 MG tablet Take 1 tablet (50 mg total) by mouth 2 (two) times daily.  180 tablet  3  . metoprolol (LOPRESSOR) 50 MG tablet Take 50 mg by mouth 2 (two) times daily.      . metoprolol (LOPRESSOR) 50 MG tablet TAKE ONE TABLET BY MOUTH TWICE DAILY   60 tablet  0  . potassium chloride (K-DUR) 10 MEQ tablet Take 4 tablets (40 mEq total) by mouth daily.  120 tablet  0  . pravastatin (PRAVACHOL) 20 MG tablet Take 20 mg by mouth at bedtime.       No current facility-administered medications for this visit.    Past Medical History  Diagnosis Date  . Glaucoma   . Hypertension   . Dysrhythmia   . Atrial fibrillation   . CHF (congestive heart failure)     Diastolic dysfunction    Past Surgical History  Procedure Laterality Date  . Abdominal hysterectomy      ROS:  As stated  in the history of present illness and negative for all other systems.  PHYSICAL EXAM BP 130/74  Pulse 61  Ht 5\' 7"  (1.702 m)  Wt 140 lb (63.504 kg)  BMI 21.92 kg/m2 GENERAL:  Frail appearing, no distress HEENT:  Pupils equal round and reactive, fundi not visualized, oral mucosa unremarkable NECK:  Positive JVD 6 cm, positive HJR, waveform within normal limits, carotid upstroke brisk and symmetric, no bruits, no thyromegaly LYMPHATICS:  No cervical, inguinal adenopathy LUNGS:  Clear to auscultation bilaterally BACK:  No CVA tenderness CHEST:  Unremarkable HEART:  PMI not displaced or sustained,S1 and S2 within normal limits, no S3,no clicks, no rubs, no murmurs, irregular ABD:  Flat, positive bowel sounds normal in frequency in pitch, no bruits, no rebound, no guarding, no midline pulsatile mass, no hepatomegaly, no splenomegaly EXT:  2 plus pulses throughout, no cyanosis no clubbing, left greater than right lower extremity edema moderate with left leg blistering SKIN:  No nodules, she does have any eruptive rash diffusely on her back NEURO:  Cranial nerves II through XII grossly intact, motor grossly intact throughout PSYCH:  Cognitively intact, oriented to person place and time  ATRIAL FIB:  Atrial fibrillation, rate 72, axis within normal limits, interventricular conduction delay, left axis deviation, no acute ST-T wave changes. 09/16/2013  ASSESSMENT AND PLAN  Congestive heart failure - She seems to have mild increased volume.  At this point I would like to give her an extra 20 mg of Lasix for two days.  We discussed daily weights and we reviewed salt restriction..  At this point, no change in therapy is indicated.  We have reviewed salt and fluid restrictions.  No further cardiovascular testing is indicated.  We will have her return for early follow up.    COUGH - This may be an ACE inhibitor cough. It is improved on Cozaar.   Atrial fibrillation - Her rate seems to be controlled.  She will continue with her current regimen.  Her rate is slow but she has no symptoms. If she does have increased fatigue or dizziness in the future we could back off on her beta blocker.  HTN - The blood pressure is at target. No change in medications is indicated. We will continue with therapeutic lifestyle changes (TLC).

## 2013-09-16 NOTE — Patient Instructions (Addendum)
Your physician has recommended you make the following change in your medication:   1. Today and Tomorrow Take Two Tablets of your Lasix (furosemide) On Sunday go back to your 1.5 tablets of your lasix Daily.    Your physician recommends that you schedule a follow-up appointment in: 1 Month with Dr Antoine PocheHochrein

## 2013-09-17 ENCOUNTER — Encounter: Payer: Self-pay | Admitting: Cardiology

## 2013-10-13 ENCOUNTER — Encounter: Payer: Self-pay | Admitting: Cardiology

## 2013-10-15 ENCOUNTER — Other Ambulatory Visit: Payer: Self-pay | Admitting: Cardiology

## 2013-10-24 ENCOUNTER — Encounter: Payer: Self-pay | Admitting: Cardiology

## 2013-10-31 ENCOUNTER — Ambulatory Visit: Payer: Medicare Other | Admitting: Cardiology

## 2013-11-21 ENCOUNTER — Other Ambulatory Visit: Payer: Self-pay | Admitting: Cardiology

## 2013-12-12 ENCOUNTER — Ambulatory Visit (INDEPENDENT_AMBULATORY_CARE_PROVIDER_SITE_OTHER): Payer: Medicare Other | Admitting: Cardiology

## 2013-12-12 ENCOUNTER — Encounter: Payer: Self-pay | Admitting: Cardiology

## 2013-12-12 VITALS — BP 138/76 | HR 62 | Ht 67.0 in | Wt 141.0 lb

## 2013-12-12 DIAGNOSIS — I4891 Unspecified atrial fibrillation: Secondary | ICD-10-CM

## 2013-12-12 DIAGNOSIS — I509 Heart failure, unspecified: Secondary | ICD-10-CM

## 2013-12-12 DIAGNOSIS — I4819 Other persistent atrial fibrillation: Secondary | ICD-10-CM

## 2013-12-12 DIAGNOSIS — I10 Essential (primary) hypertension: Secondary | ICD-10-CM

## 2013-12-12 LAB — BASIC METABOLIC PANEL
BUN: 16 mg/dL (ref 6–23)
CO2: 30 mEq/L (ref 19–32)
Calcium: 9.3 mg/dL (ref 8.4–10.5)
Chloride: 97 mEq/L (ref 96–112)
Creat: 1.14 mg/dL — ABNORMAL HIGH (ref 0.50–1.10)
Glucose, Bld: 124 mg/dL — ABNORMAL HIGH (ref 70–99)
POTASSIUM: 4.2 meq/L (ref 3.5–5.3)
SODIUM: 138 meq/L (ref 135–145)

## 2013-12-12 LAB — CBC
HEMATOCRIT: 37.3 % (ref 36.0–46.0)
Hemoglobin: 12.5 g/dL (ref 12.0–15.0)
MCH: 31.9 pg (ref 26.0–34.0)
MCHC: 33.5 g/dL (ref 30.0–36.0)
MCV: 95.2 fL (ref 78.0–100.0)
PLATELETS: 228 10*3/uL (ref 150–400)
RBC: 3.92 MIL/uL (ref 3.87–5.11)
RDW: 15.5 % (ref 11.5–15.5)
WBC: 5.9 10*3/uL (ref 4.0–10.5)

## 2013-12-12 NOTE — Progress Notes (Signed)
HPI The patient presents for evaluation of atrial fibrillation and diastolic heart failure. At the last visit she had had increased weight and evidence of increased fluid.  I gave her some extra diuretic.  She was having SOB.  However, she says that this has resolved.  She denies any new weight gain although her weights are stable.  She says that her breathing is fine.  The patient denies any new symptoms such as chest discomfort, neck or arm discomfort. There has been no new shortness of breath, PND or orthopnea. There have been no reported palpitations, presyncope or syncope.  She is frail but does some household chores and still lives alone.   Allergies  Allergen Reactions  . Lisinopril     Cough    Current Outpatient Prescriptions  Medication Sig Dispense Refill  . dabigatran (PRADAXA) 150 MG CAPS Take 1 capsule (150 mg total) by mouth 2 (two) times daily.  60 capsule    . furosemide (LASIX) 40 MG tablet Take 1.5 tablets (60 mg total) by mouth daily.  45 tablet  1  . ibandronate (BONIVA) 150 MG tablet Take 150 mg by mouth every 30 (thirty) days. Take in the morning with a full glass of water, on an empty stomach, and do not take anything else by mouth or lie down for the next 30 min.      . latanoprost (XALATAN) 0.005 % ophthalmic solution Place 1 drop into both eyes 2 (two) times daily.       Marland Kitchen losartan (COZAAR) 50 MG tablet Take 1 tablet (50 mg total) by mouth 2 (two) times daily.  180 tablet  3  . metoprolol (LOPRESSOR) 50 MG tablet Take 50 mg by mouth 2 (two) times daily.      . potassium chloride (K-DUR) 10 MEQ tablet Take 4 tablets (40 mEq total) by mouth daily.  120 tablet  0  . pravastatin (PRAVACHOL) 20 MG tablet Take 20 mg by mouth at bedtime.       No current facility-administered medications for this visit.    Past Medical History  Diagnosis Date  . Glaucoma   . Hypertension   . Dysrhythmia   . Atrial fibrillation   . CHF (congestive heart failure)     Diastolic  dysfunction    Past Surgical History  Procedure Laterality Date  . Abdominal hysterectomy      ROS:  As stated in the history of present illness and negative for all other systems.  PHYSICAL EXAM BP 138/76  Pulse 62  Ht 5\' 7"  (1.702 m)  Wt 141 lb (63.957 kg)  BMI 22.08 kg/m2 GENERAL:  Frail appearing, no distress HEENT:  Pupils equal round and reactive, fundi not visualized, oral mucosa unremarkable NECK: No JVD, waveform within normal limits, carotid upstroke brisk and symmetric, no bruits, no thyromegaly LUNGS:  Clear to auscultation bilaterally BACK:  No CVA tenderness CHEST:  Unremarkable HEART:  PMI not displaced or sustained,S1 and S2 within normal limits, no S3, no clicks, no rubs, no murmurs, irregular ABD:  Flat, positive bowel sounds normal in frequency in pitch, no bruits, no rebound, no guarding, no midline pulsatile mass, no hepatomegaly, no splenomegaly EXT:  2 plus pulses throughout, no cyanosis no clubbing, compression stockings in place, mild edema   ASSESSMENT AND PLAN  Congestive heart failure - She will remain on the meds as listed.  We discussed salt and fluid restriction.    I will check a BMET.   Atrial fibrillation - The  patient  tolerates this rhythm and rate control and anticoagulation. We will continue with the meds as listed.  I will check a CBC  HTN - The blood pressure is at target. No change in medications is indicated. We will continue with therapeutic lifestyle changes (TLC).

## 2013-12-12 NOTE — Patient Instructions (Signed)
Your physician recommends that you schedule a follow-up appointment in:  one year with Dr hochrein  Have your labs done today

## 2013-12-18 ENCOUNTER — Encounter (HOSPITAL_COMMUNITY): Payer: Self-pay | Admitting: Emergency Medicine

## 2013-12-18 ENCOUNTER — Emergency Department (HOSPITAL_COMMUNITY): Payer: Medicare Other

## 2013-12-18 ENCOUNTER — Inpatient Hospital Stay (HOSPITAL_COMMUNITY)
Admission: EM | Admit: 2013-12-18 | Discharge: 2013-12-20 | DRG: 178 | Disposition: A | Discharge status: Home Health | Payer: Medicare Other | Attending: Internal Medicine | Admitting: Internal Medicine

## 2013-12-18 DIAGNOSIS — J690 Pneumonitis due to inhalation of food and vomit: Principal | ICD-10-CM | POA: Diagnosis present

## 2013-12-18 DIAGNOSIS — E785 Hyperlipidemia, unspecified: Secondary | ICD-10-CM | POA: Diagnosis present

## 2013-12-18 DIAGNOSIS — I10 Essential (primary) hypertension: Secondary | ICD-10-CM | POA: Diagnosis present

## 2013-12-18 DIAGNOSIS — I482 Chronic atrial fibrillation, unspecified: Secondary | ICD-10-CM

## 2013-12-18 DIAGNOSIS — F10920 Alcohol use, unspecified with intoxication, uncomplicated: Secondary | ICD-10-CM

## 2013-12-18 DIAGNOSIS — Z7901 Long term (current) use of anticoagulants: Secondary | ICD-10-CM | POA: Diagnosis not present

## 2013-12-18 DIAGNOSIS — Z9181 History of falling: Secondary | ICD-10-CM

## 2013-12-18 DIAGNOSIS — I5032 Chronic diastolic (congestive) heart failure: Secondary | ICD-10-CM

## 2013-12-18 DIAGNOSIS — W19XXXA Unspecified fall, initial encounter: Secondary | ICD-10-CM | POA: Diagnosis present

## 2013-12-18 DIAGNOSIS — H409 Unspecified glaucoma: Secondary | ICD-10-CM

## 2013-12-18 DIAGNOSIS — E876 Hypokalemia: Secondary | ICD-10-CM | POA: Diagnosis present

## 2013-12-18 DIAGNOSIS — N390 Urinary tract infection, site not specified: Secondary | ICD-10-CM | POA: Diagnosis present

## 2013-12-18 DIAGNOSIS — Z609 Problem related to social environment, unspecified: Secondary | ICD-10-CM | POA: Diagnosis not present

## 2013-12-18 DIAGNOSIS — F1010 Alcohol abuse, uncomplicated: Secondary | ICD-10-CM | POA: Diagnosis present

## 2013-12-18 DIAGNOSIS — Y92009 Unspecified place in unspecified non-institutional (private) residence as the place of occurrence of the external cause: Secondary | ICD-10-CM | POA: Diagnosis not present

## 2013-12-18 DIAGNOSIS — F329 Major depressive disorder, single episode, unspecified: Secondary | ICD-10-CM | POA: Diagnosis present

## 2013-12-18 DIAGNOSIS — Z87891 Personal history of nicotine dependence: Secondary | ICD-10-CM

## 2013-12-18 DIAGNOSIS — R296 Repeated falls: Secondary | ICD-10-CM

## 2013-12-18 DIAGNOSIS — F3289 Other specified depressive episodes: Secondary | ICD-10-CM | POA: Diagnosis present

## 2013-12-18 DIAGNOSIS — J189 Pneumonia, unspecified organism: Secondary | ICD-10-CM | POA: Diagnosis present

## 2013-12-18 DIAGNOSIS — F1994 Other psychoactive substance use, unspecified with psychoactive substance-induced mood disorder: Secondary | ICD-10-CM

## 2013-12-18 DIAGNOSIS — F32A Depression, unspecified: Secondary | ICD-10-CM | POA: Diagnosis present

## 2013-12-18 DIAGNOSIS — IMO0002 Reserved for concepts with insufficient information to code with codable children: Secondary | ICD-10-CM | POA: Diagnosis present

## 2013-12-18 DIAGNOSIS — I4891 Unspecified atrial fibrillation: Secondary | ICD-10-CM | POA: Diagnosis present

## 2013-12-18 DIAGNOSIS — I509 Heart failure, unspecified: Secondary | ICD-10-CM | POA: Diagnosis present

## 2013-12-18 DIAGNOSIS — G4700 Insomnia, unspecified: Secondary | ICD-10-CM | POA: Diagnosis present

## 2013-12-18 DIAGNOSIS — I4819 Other persistent atrial fibrillation: Secondary | ICD-10-CM

## 2013-12-18 LAB — BASIC METABOLIC PANEL
ANION GAP: 17 — AB (ref 5–15)
BUN: 13 mg/dL (ref 6–23)
CHLORIDE: 99 meq/L (ref 96–112)
CO2: 26 mEq/L (ref 19–32)
Calcium: 8.9 mg/dL (ref 8.4–10.5)
Creatinine, Ser: 1.09 mg/dL (ref 0.50–1.10)
GFR calc non Af Amer: 46 mL/min — ABNORMAL LOW (ref >90)
GFR, EST AFRICAN AMERICAN: 53 mL/min — AB (ref >90)
Glucose, Bld: 101 mg/dL — ABNORMAL HIGH (ref 70–99)
POTASSIUM: 2.7 meq/L — AB (ref 3.7–5.3)
Sodium: 142 mEq/L (ref 137–147)

## 2013-12-18 LAB — URINALYSIS, ROUTINE W REFLEX MICROSCOPIC
Bilirubin Urine: NEGATIVE
Glucose, UA: NEGATIVE mg/dL
KETONES UR: NEGATIVE mg/dL
NITRITE: POSITIVE — AB
PH: 5.5 (ref 5.0–8.0)
Protein, ur: 30 mg/dL — AB
SPECIFIC GRAVITY, URINE: 1.015 (ref 1.005–1.030)
Urobilinogen, UA: 1 mg/dL (ref 0.0–1.0)

## 2013-12-18 LAB — CBC WITH DIFFERENTIAL/PLATELET
BASOS PCT: 1 % (ref 0–1)
Basophils Absolute: 0 10*3/uL (ref 0.0–0.1)
Eosinophils Absolute: 0.1 10*3/uL (ref 0.0–0.7)
Eosinophils Relative: 1 % (ref 0–5)
HCT: 39.3 % (ref 36.0–46.0)
Hemoglobin: 12.5 g/dL (ref 12.0–15.0)
LYMPHS ABS: 1.9 10*3/uL (ref 0.7–4.0)
LYMPHS PCT: 36 % (ref 12–46)
MCH: 31.7 pg (ref 26.0–34.0)
MCHC: 31.8 g/dL (ref 30.0–36.0)
MCV: 99.7 fL (ref 78.0–100.0)
MONO ABS: 0.4 10*3/uL (ref 0.1–1.0)
Monocytes Relative: 8 % (ref 3–12)
NEUTROS ABS: 2.8 10*3/uL (ref 1.7–7.7)
NEUTROS PCT: 54 % (ref 43–77)
Platelets: 217 10*3/uL (ref 150–400)
RBC: 3.94 MIL/uL (ref 3.87–5.11)
RDW: 14.6 % (ref 11.5–15.5)
WBC: 5.2 10*3/uL (ref 4.0–10.5)

## 2013-12-18 LAB — PROTIME-INR
INR: 1.26 (ref 0.00–1.49)
Prothrombin Time: 15.8 seconds — ABNORMAL HIGH (ref 11.6–15.2)

## 2013-12-18 LAB — RAPID URINE DRUG SCREEN, HOSP PERFORMED
Amphetamines: NOT DETECTED
BARBITURATES: NOT DETECTED
Benzodiazepines: NOT DETECTED
COCAINE: NOT DETECTED
OPIATES: NOT DETECTED
Tetrahydrocannabinol: NOT DETECTED

## 2013-12-18 LAB — ACETAMINOPHEN LEVEL

## 2013-12-18 LAB — URINE MICROSCOPIC-ADD ON

## 2013-12-18 LAB — TSH: TSH: 0.72 u[IU]/mL (ref 0.350–4.500)

## 2013-12-18 LAB — ETHANOL: Alcohol, Ethyl (B): 214 mg/dL — ABNORMAL HIGH (ref 0–11)

## 2013-12-18 LAB — I-STAT TROPONIN, ED: TROPONIN I, POC: 0.02 ng/mL (ref 0.00–0.08)

## 2013-12-18 LAB — MAGNESIUM: Magnesium: 1.6 mg/dL (ref 1.5–2.5)

## 2013-12-18 LAB — SALICYLATE LEVEL: Salicylate Lvl: <2 mg/dL — ABNORMAL LOW (ref 2.8–20.0)

## 2013-12-18 LAB — STREP PNEUMONIAE URINARY ANTIGEN: Strep Pneumo Urinary Antigen: NEGATIVE

## 2013-12-18 LAB — I-STAT CG4 LACTIC ACID, ED: Lactic Acid, Venous: 2.34 mmol/L — ABNORMAL HIGH (ref 0.5–2.2)

## 2013-12-18 MED ORDER — LORAZEPAM 1 MG PO TABS
1.0000 mg | ORAL_TABLET | Freq: Four times a day (QID) | ORAL | Status: DC | PRN
  Administered 2013-12-19: 1 mg via ORAL
  Filled 2013-12-18: qty 1

## 2013-12-18 MED ORDER — DEXTROSE 5 % IV SOLN
500.0000 mg | INTRAVENOUS | Status: DC
  Administered 2013-12-19: 500 mg via INTRAVENOUS
  Filled 2013-12-18 (×2): qty 500

## 2013-12-18 MED ORDER — GUAIFENESIN-DM 100-10 MG/5ML PO SYRP
5.0000 mL | ORAL_SOLUTION | ORAL | Status: DC | PRN
  Filled 2013-12-18: qty 5

## 2013-12-18 MED ORDER — ONDANSETRON HCL 4 MG/2ML IJ SOLN
4.0000 mg | Freq: Four times a day (QID) | INTRAMUSCULAR | Status: DC | PRN
  Administered 2013-12-18: 4 mg via INTRAVENOUS
  Filled 2013-12-18: qty 2

## 2013-12-18 MED ORDER — LOSARTAN POTASSIUM 50 MG PO TABS
50.0000 mg | ORAL_TABLET | Freq: Two times a day (BID) | ORAL | Status: DC
  Administered 2013-12-18 – 2013-12-20 (×4): 50 mg via ORAL
  Filled 2013-12-18 (×5): qty 1

## 2013-12-18 MED ORDER — METOPROLOL TARTRATE 50 MG PO TABS
50.0000 mg | ORAL_TABLET | Freq: Two times a day (BID) | ORAL | Status: DC
  Administered 2013-12-18 – 2013-12-20 (×4): 50 mg via ORAL
  Filled 2013-12-18 (×5): qty 1

## 2013-12-18 MED ORDER — POTASSIUM CHLORIDE CRYS ER 20 MEQ PO TBCR
40.0000 meq | EXTENDED_RELEASE_TABLET | Freq: Once | ORAL | Status: AC
  Administered 2013-12-18: 40 meq via ORAL
  Filled 2013-12-18: qty 2

## 2013-12-18 MED ORDER — FUROSEMIDE 40 MG PO TABS
40.0000 mg | ORAL_TABLET | Freq: Every day | ORAL | Status: DC
  Administered 2013-12-19 – 2013-12-20 (×2): 40 mg via ORAL
  Filled 2013-12-18 (×2): qty 1

## 2013-12-18 MED ORDER — POTASSIUM CHLORIDE IN NACL 20-0.9 MEQ/L-% IV SOLN
INTRAVENOUS | Status: DC
Start: 2013-12-18 — End: 2013-12-19
  Administered 2013-12-18 – 2013-12-19 (×2): via INTRAVENOUS
  Filled 2013-12-18 (×3): qty 1000

## 2013-12-18 MED ORDER — AMPICILLIN-SULBACTAM SODIUM 3 (2-1) G IJ SOLR
3.0000 g | Freq: Three times a day (TID) | INTRAMUSCULAR | Status: DC
  Administered 2013-12-18 – 2013-12-20 (×5): 3 g via INTRAVENOUS
  Filled 2013-12-18 (×7): qty 3

## 2013-12-18 MED ORDER — DEXTROSE 5 % IV SOLN
1.0000 g | Freq: Once | INTRAVENOUS | Status: AC
  Administered 2013-12-18: 1 g via INTRAVENOUS
  Filled 2013-12-18: qty 10

## 2013-12-18 MED ORDER — ADULT MULTIVITAMIN W/MINERALS CH
1.0000 | ORAL_TABLET | Freq: Every day | ORAL | Status: DC
  Administered 2013-12-18 – 2013-12-20 (×3): 1 via ORAL
  Filled 2013-12-18 (×3): qty 1

## 2013-12-18 MED ORDER — SODIUM CHLORIDE 0.9 % IV BOLUS (SEPSIS)
500.0000 mL | Freq: Once | INTRAVENOUS | Status: AC
  Administered 2013-12-18: 500 mL via INTRAVENOUS

## 2013-12-18 MED ORDER — POTASSIUM CHLORIDE ER 10 MEQ PO TBCR
40.0000 meq | EXTENDED_RELEASE_TABLET | Freq: Every day | ORAL | Status: DC
  Administered 2013-12-18: 40 meq via ORAL
  Filled 2013-12-18 (×2): qty 4

## 2013-12-18 MED ORDER — LORAZEPAM 2 MG/ML IJ SOLN: 1.0000 mg | Freq: Four times a day (QID) | INTRAMUSCULAR | Status: DC | PRN

## 2013-12-18 MED ORDER — ONDANSETRON HCL 4 MG PO TABS: 4.0000 mg | ORAL_TABLET | Freq: Four times a day (QID) | ORAL | Status: DC | PRN

## 2013-12-18 MED ORDER — VITAMIN B-1 100 MG PO TABS
100.0000 mg | ORAL_TABLET | Freq: Every day | ORAL | Status: DC
  Administered 2013-12-18 – 2013-12-20 (×3): 100 mg via ORAL
  Filled 2013-12-18 (×3): qty 1

## 2013-12-18 MED ORDER — ALBUTEROL SULFATE (2.5 MG/3ML) 0.083% IN NEBU: 2.5000 mg | INHALATION_SOLUTION | RESPIRATORY_TRACT | Status: DC | PRN

## 2013-12-18 MED ORDER — SODIUM CHLORIDE 0.9 % IV SOLN
Freq: Once | INTRAVENOUS | Status: AC
  Administered 2013-12-18: 13:00:00 via INTRAVENOUS

## 2013-12-18 MED ORDER — ALUM & MAG HYDROXIDE-SIMETH 200-200-20 MG/5ML PO SUSP: 30.0000 mL | Freq: Four times a day (QID) | ORAL | Status: DC | PRN

## 2013-12-18 MED ORDER — OXYCODONE HCL 5 MG PO TABS
5.0000 mg | ORAL_TABLET | ORAL | Status: DC | PRN
  Administered 2013-12-18: 5 mg via ORAL
  Filled 2013-12-18: qty 1

## 2013-12-18 MED ORDER — DEXTROSE 5 % IV SOLN
500.0000 mg | Freq: Once | INTRAVENOUS | Status: DC
  Administered 2013-12-18: 500 mg via INTRAVENOUS
  Filled 2013-12-18: qty 500

## 2013-12-18 MED ORDER — METOPROLOL TARTRATE 25 MG PO TABS: 50.0000 mg | ORAL_TABLET | Freq: Two times a day (BID) | ORAL | Status: DC

## 2013-12-18 MED ORDER — LATANOPROST 0.005 % OP SOLN
1.0000 [drp] | Freq: Two times a day (BID) | OPHTHALMIC | Status: DC
  Administered 2013-12-18 – 2013-12-19 (×3): 1 [drp] via OPHTHALMIC
  Filled 2013-12-18 (×2): qty 2.5

## 2013-12-18 MED ORDER — THIAMINE HCL 100 MG/ML IJ SOLN
100.0000 mg | Freq: Every day | INTRAMUSCULAR | Status: DC
  Filled 2013-12-18: qty 1

## 2013-12-18 MED ORDER — SIMVASTATIN 10 MG PO TABS
10.0000 mg | ORAL_TABLET | Freq: Every day | ORAL | Status: DC
  Administered 2013-12-18 – 2013-12-19 (×2): 10 mg via ORAL
  Filled 2013-12-18 (×3): qty 1

## 2013-12-18 MED ORDER — POTASSIUM CHLORIDE CRYS ER 20 MEQ PO TBCR
20.0000 meq | EXTENDED_RELEASE_TABLET | Freq: Once | ORAL | Status: AC
  Administered 2013-12-18: 20 meq via ORAL
  Filled 2013-12-18: qty 1

## 2013-12-18 MED ORDER — SENNA 8.6 MG PO TABS
1.0000 | ORAL_TABLET | Freq: Two times a day (BID) | ORAL | Status: DC
  Administered 2013-12-18 – 2013-12-20 (×4): 8.6 mg via ORAL
  Filled 2013-12-18 (×5): qty 1

## 2013-12-18 MED ORDER — TRAZODONE HCL 50 MG PO TABS
50.0000 mg | ORAL_TABLET | Freq: Every evening | ORAL | Status: DC | PRN
  Administered 2013-12-18: 50 mg via ORAL
  Filled 2013-12-18 (×2): qty 1

## 2013-12-18 MED ORDER — FOLIC ACID 1 MG PO TABS
1.0000 mg | ORAL_TABLET | Freq: Every day | ORAL | Status: DC
  Administered 2013-12-18 – 2013-12-20 (×3): 1 mg via ORAL
  Filled 2013-12-18 (×3): qty 1

## 2013-12-18 MED ORDER — ACETAMINOPHEN 650 MG RE SUPP: 650.0000 mg | Freq: Four times a day (QID) | RECTAL | Status: DC | PRN

## 2013-12-18 MED ORDER — DABIGATRAN ETEXILATE MESYLATE 150 MG PO CAPS
150.0000 mg | ORAL_CAPSULE | Freq: Two times a day (BID) | ORAL | Status: DC
  Administered 2013-12-18 – 2013-12-20 (×4): 150 mg via ORAL
  Filled 2013-12-18 (×6): qty 1

## 2013-12-18 MED ORDER — ACETAMINOPHEN 325 MG PO TABS: 650.0000 mg | ORAL_TABLET | Freq: Four times a day (QID) | ORAL | Status: DC | PRN

## 2013-12-18 NOTE — ED Notes (Signed)
Pt complained that "all her friends have gone" and that she "just doesn't care" per medics pt refusing to talk at this time

## 2013-12-18 NOTE — ED Notes (Signed)
Pt in ct 

## 2013-12-18 NOTE — ED Provider Notes (Signed)
CSN: [id number]     Arrival date & time 12/18/13  1123 History   First MD Initiated Contact with Patient 12/18/13 1132     Chief Complaint  Patient presents with  . Failure To Thrive      HPI  Patient presents via EMS him home. Her nephew over today and found her in a chair. She was weak and crying and sobbing. Stating she did not want to live. Stated "all my friends are gone". She states "nobody cares and neither do I.". Patient is hesitant to answer a lot of questions. Does not admit to any recent illnesses. Will not answer whether or not she is suicidal.   Ultimately her nephew and some other family members arrived. 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 Have all died within the last few years and she does have bouts of depression.  No known alcohol use per their report.  Past Medical History  Diagnosis Date  . Glaucoma   . Hypertension   . Dysrhythmia   . Atrial fibrillation   . CHF (congestive heart failure)     Diastolic dysfunction   Past Surgical History  Procedure Laterality Date  . Abdominal hysterectomy     Family History  Problem Relation Age of Onset  . Cancer Father   . CVA Mother    History  Substance Use Topics  . Smoking status: Former Games developermoker  . Smokeless tobacco: Never Used  . Alcohol Use: No   OB History   Grav Para Term Preterm Abortions TAB SAB Ect Mult Living                 Review of Systems  Unable to perform ROS: Mental status change      Allergies  Lisinopril  Home Medications   Prior to Admission medications   Medication Sig Start Date End Date Taking? Authorizing Provider  dabigatran (PRADAXA) 150 MG CAPS Take 1 capsule (150 mg total) by mouth 2 (two) times daily. 04/26/12   Rosalio MacadamiaLori C Gerhardt, NP  furosemide (LASIX) 40 MG tablet Take 1.5 tablets (60 mg total) by mouth daily. 04/26/13   Vassie Lollarlos Madera, MD  ibandronate (BONIVA) 150 MG tablet Take 150 mg by mouth every 30 (thirty)  days. Take in the morning with a full glass of water, on an empty stomach, and do not take anything else by mouth or lie down for the next 30 min.    Historical Provider, MD  latanoprost (XALATAN) 0.005 % ophthalmic solution Place 1 drop into both eyes 2 (two) times daily.  01/21/13   Historical Provider, MD  losartan (COZAAR) 50 MG tablet Take 1 tablet (50 mg total) by mouth 2 (two) times daily. 02/01/13   Rollene RotundaJames Hochrein, MD  metoprolol (LOPRESSOR) 50 MG tablet Take 50 mg by mouth 2 (two) times daily.    Historical Provider, MD  potassium chloride (K-DUR) 10 MEQ tablet Take 4 tablets (40 mEq total) by mouth daily. 04/26/13   Vassie Lollarlos Madera, MD  pravastatin (PRAVACHOL) 20 MG tablet Take 20 mg by mouth at bedtime.    Historical Provider, MD   BP 132/78  Pulse 68  Temp(Src) 97.5 F (36.4 C) (Oral)  Resp 15  Ht 5\' 7"  (1.702 m)  Wt 150 lb (68.04 kg)  BMI 23.49 kg/m2  SpO2 94% Physical Exam  Constitutional: She is oriented to person, place, and time. She appears well-developed and well-nourished. She appears distressed.  Crying and  sobbing. Eyes are closed. She will open her eyes to my voice.    HENT:  Head: Normocephalic.  No scleral icterus. No conjunctival injection. Conjunctiva not pale  Eyes: Conjunctivae are normal. Pupils are equal, round, and reactive to light. No scleral icterus.  3mm pupils.  Symmetric reactive.  Neck: Normal range of motion. Neck supple. No thyromegaly present.  No JVD. No thyromegaly.  Cardiovascular: Normal rate and regular rhythm.  Exam reveals no gallop and no friction rub.   No murmur heard. Sinus rhythm on the monitor. Regular.  Pulmonary/Chest: Effort normal and breath sounds normal. No respiratory distress. She has no wheezes. She has no rales.  clear bilaterally  Abdominal: Soft. Bowel sounds are normal. She exhibits no distension. There is no tenderness. There is no rebound.  Musculoskeletal: Normal range of motion.  Neurological: She is alert and oriented  to person, place, and time.  Moves all 4 extremities  Skin: Skin is warm and dry. No rash noted.  Psychiatric: She has a normal mood and affect. Her behavior is normal.    ED Course  Procedures (including critical care time) Labs Review Labs Reviewed  BASIC METABOLIC PANEL - Abnormal; Notable for the following:    Potassium 2.7 (*)    Glucose, Bld 101 (*)    GFR calc non Af Amer 46 (*)    GFR calc Af Amer 53 (*)    Anion gap 17 (*)    All other components within normal limits  PROTIME-INR - Abnormal; Notable for the following:    Prothrombin Time 15.8 (*)    All other components within normal limits  URINALYSIS, ROUTINE W REFLEX MICROSCOPIC - Abnormal; Notable for the following:    APPearance CLOUDY (*)    Hgb urine dipstick TRACE (*)    Protein, ur 30 (*)    Nitrite POSITIVE (*)    Leukocytes, UA MODERATE (*)    All other components within normal limits  SALICYLATE LEVEL - Abnormal; Notable for the following:    Salicylate Lvl <2.0 (*)    All other components within normal limits  ETHANOL - Abnormal; Notable for the following:    Alcohol, Ethyl (B) 214 (*)    All other components within normal limits  URINE MICROSCOPIC-ADD ON - Abnormal; Notable for the following:    Bacteria, UA MANY (*)    All other components within normal limits  URINE CULTURE  CULTURE, BLOOD (ROUTINE X 2)  CULTURE, BLOOD (ROUTINE X 2)  CBC WITH DIFFERENTIAL  TSH  ACETAMINOPHEN LEVEL  URINE RAPID DRUG SCREEN (HOSP PERFORMED)  I-STAT TROPOININ, ED  I-STAT CG4 LACTIC ACID, ED    Imaging Review Dg Chest 1 View  12/18/2013   CLINICAL DATA:  Failure to thrive.  AFib.  EXAM: CHEST - 1 VIEW  COMPARISON:  04/22/2013  FINDINGS: Moderate cardiomegaly. There is opacity at the medial right apex worrisome for airspace disease. Blunting of the left costophrenic angle is present worrisome for a small left pleural effusion. Pulmonary vascularity is within normal limits. Atelectasis at the right base.  IMPRESSION:  Right apical pulmonary opacity. Follow-up studies until resolution are recommended.  Small left pleural effusion  Cardiomegaly.   Electronically Signed   By: Maryclare Bean M.D.   On: 12/18/2013 13:06   Ct Head Wo Contrast  12/18/2013   CLINICAL DATA:  Altered mental status, failure to thrive  EXAM: CT HEAD WITHOUT CONTRAST  TECHNIQUE: Contiguous axial images were obtained from the base of the skull through the  vertex without intravenous contrast.  COMPARISON:  None.  FINDINGS: Mild cortical volume loss noted with proportional ventricular prominence. Areas of periventricular white matter hypodensity are most compatible with small vessel ischemic change. No acute hemorrhage, infarct, or mass lesion is identified. Right maxillary opacification is noted with mild ethmoid mucoperiosteal thickening. No skull fracture.  IMPRESSION: No acute intracranial finding.  Chronic findings as above.  Right maxillary sinusitis.   Electronically Signed   By: Christiana Pellant M.D.   On: 12/18/2013 12:52     EKG Interpretation   Date/Time:  Sunday December 18 2013 11:27:08 EDT Ventricular Rate:  45 PR Interval:    QRS Duration: 149 QT Interval:  517 QTC Calculation: 447 R Axis:   -78 Text Interpretation:  Atrial fibrillation IVCD, consider atypical RBBB LVH  with secondary repolarization abnormality Inferior infarct, old Anterior  infarct, old Confirmed by Fayrene Fearing  MD, Norell Brisbin ([id number]) on 12/18/2013 11:32:08 AM      MDM   Final diagnoses:  Alcohol intoxication, uncomplicated  Community acquired pneumonia    Family was shocked to hear that her alcohol level was quite high (0.214). Has infiltrate on x-ray. Not febrile or hypoxic.  UA shows urinary tract infection. Potassium slightly low at 2.9. This is supplemented. Discussed case with hospitalist regarding admission. She will need after evaluation for her depression. However this point not medically cleared to simply sobriety for that evaluation in the emergency room. Given  antibiotics. She'll be admitted.    Rolland Porter, MD 12/18/13 [phone]

## 2013-12-18 NOTE — Progress Notes (Signed)
12/18/13 Patient from Emergency room with Dx of Pneumonia, IV site RFA,Low fall risk,and allergy to Lisinopril.

## 2013-12-18 NOTE — ED Notes (Signed)
Pt tearful, crying states feels "all alone I have no one" emotional support given

## 2013-12-18 NOTE — H&P (Signed)
PATIENT DETAILS Name: Sydney Hensley Age: 80 y.o. Sex: female Date of Birth: 01-Sep-1927 Admit Date: 12/18/2013 ZOX:WRUEAV,WUJW A, MD  CHIEF COMPLAINT:  Found Intoxicated by family  HPI: Sydney Hensley is a 80 y.o. female with a Past Medical History of chronic diastolic heart failure, atrial fibrillation on Pradaxa, hypertension, dyslipidemia who presents today with the above noted complaint. Please note, no family members at bedside, this M.D. tried calling niece and nephew listed as next of kin-unsuccessful in contacting them. Most of this history is obtained from the patient, and from the ED chart. Per patient, she lives alone and has no children of her own. Her nephew lives nearby and checks on her on a daily basis it she claims she has no other immediate family members, and just wants to "die". She claims that she has trouble with insomnia for the past numerous months, and drinks a glass of "bourbon" every night to help her sleep. She claims that she has been doing this for many months. She apparently was found by her nephew earlier today on a chair, very weak, crying and sobbing. She was then brought to the emergency room for further evaluation, where x-ray showed a right upper lobe infiltrate, a alcohol level was 214. She was also found to be severely hypokalemic. I was subsequently asked to admit this patient for further evaluation and treatment. During my evaluation, patient claims that she thinks she only drank one glass of bourbon last night, but thinks she could have drank more as she was found in the chair that she sat on to drink (last evening) this morning. He also claims that over the past few months she has fallen at least 2-3 times a month. She denies any recent fever, cough, shortness of breath, nausea, vomiting or diarrhea.  ALLERGIES:   Allergies  Allergen Reactions  . Lisinopril Other (See Comments)    Cough    PAST MEDICAL HISTORY: Past Medical History    Diagnosis Date  . Glaucoma   . Hypertension   . Dysrhythmia   . Atrial fibrillation   . CHF (congestive heart failure)     Diastolic dysfunction    PAST SURGICAL HISTORY: Past Surgical History  Procedure Laterality Date  . Abdominal hysterectomy      MEDICATIONS AT HOME: Prior to Admission medications   Medication Sig Start Date End Date Taking? Authorizing Provider  dabigatran (PRADAXA) 150 MG CAPS Take 1 capsule (150 mg total) by mouth 2 (two) times daily. 04/26/12   Rosalio Macadamia, NP  furosemide (LASIX) 40 MG tablet Take 1.5 tablets (60 mg total) by mouth daily. 04/26/13   Vassie Loll, MD  ibandronate (BONIVA) 150 MG tablet Take 150 mg by mouth every 30 (thirty) days. Take in the morning with a full glass of water, on an empty stomach, and do not take anything else by mouth or lie down for the next 30 min.    Historical Provider, MD  latanoprost (XALATAN) 0.005 % ophthalmic solution Place 1 drop into both eyes 2 (two) times daily.  01/21/13   Historical Provider, MD  losartan (COZAAR) 50 MG tablet Take 1 tablet (50 mg total) by mouth 2 (two) times daily. 02/01/13   Rollene Rotunda, MD  metoprolol (LOPRESSOR) 50 MG tablet Take 50 mg by mouth 2 (two) times daily.    Historical Provider, MD  potassium chloride (K-DUR) 10 MEQ tablet Take 4 tablets (40 mEq total) by mouth daily. 04/26/13   Mikle Bosworth  Gwenlyn PerkingMadera, MD  pravastatin (PRAVACHOL) 20 MG tablet Take 20 mg by mouth at bedtime.    Historical Provider, MD    FAMILY HISTORY: Family History  Problem Relation Age of Onset  . Cancer Father   . CVA Mother    SOCIAL HISTORY:  reports that she has quit smoking. She has never used smokeless tobacco. She reports that she does not drink alcohol or use illicit drugs.  REVIEW OF SYSTEMS:  Constitutional:   No  weight loss, night sweats,  Fevers, chills, fatigue.  HEENT:    No headaches, Difficulty swallowing,Tooth/dental problems,Sore throat,   Cardio-vascular: No chest pain,  Orthopnea,  PND, swelling in lower extremities  GI:  No heartburn, indigestion, abdominal pain, nausea, vomiting, diarrhea.  Resp: No shortness of breath with exertion or at rest.  No excess mucus, no productive cough, No non-productive cough,  No coughing up of blood.No change in color of mucus.No wheezing.  Skin:  no rash or lesions.  GU:  no dysuria, change in color of urine, no urgency or frequency.  No flank pain.  Musculoskeletal: No joint pain or swelling.  No decreased range of motion.  No back pain.  Psych: No change in mood or affect.  PHYSICAL EXAM: Blood pressure 157/79, pulse 70, temperature 97.5 F (36.4 C), temperature source Oral, resp. rate 28, height 5\' 7"  (1.702 m), weight 68.04 kg (150 lb), SpO2 94.00%.  General appearance :Awake, alert, not in any distress. Speech Clear. Not toxic Looking HEENT: Atraumatic and Normocephalic, pupils equally reactive to light and accomodation Neck: supple, no JVD. No cervical lymphadenopathy.  Chest:Good air entry bilaterally, no added sounds  CVS: S1 S2 regular, no murmurs.  Abdomen: Bowel sounds present, Non tender and not distended with no gaurding, rigidity or rebound. Extremities: B/L Lower Ext shows no edema, both legs are warm to touch Neurology: Awake alert, and oriented X 3, CN II-XII intact, Non focal Skin:No Rash Wounds:N/A  LABS ON ADMISSION:   Recent Labs  12/18/13 1145  NA 142  K 2.7*  CL 99  CO2 26  GLUCOSE 101*  BUN 13  CREATININE 1.09  CALCIUM 8.9   No results found for this basename: AST, ALT, ALKPHOS, BILITOT, PROT, ALBUMIN,  in the last 72 hours No results found for this basename: LIPASE, AMYLASE,  in the last 72 hours  Recent Labs  12/18/13 1145  WBC 5.2  NEUTROABS 2.8  HGB 12.5  HCT 39.3  MCV 99.7  PLT 217   No results found for this basename: CKTOTAL, CKMB, CKMBINDEX, TROPONINI,  in the last 72 hours No results found for this basename: DDIMER,  in the last 72 hours No components found with  this basename: POCBNP,    RADIOLOGIC STUDIES ON ADMISSION: Dg Chest 1 View  12/18/2013   CLINICAL DATA:  Failure to thrive.  AFib.  EXAM: CHEST - 1 VIEW  COMPARISON:  04/22/2013  FINDINGS: Moderate cardiomegaly. There is opacity at the medial right apex worrisome for airspace disease. Blunting of the left costophrenic angle is present worrisome for a small left pleural effusion. Pulmonary vascularity is within normal limits. Atelectasis at the right base.  IMPRESSION: Right apical pulmonary opacity. Follow-up studies until resolution are recommended.  Small left pleural effusion  Cardiomegaly.   Electronically Signed   By: Maryclare BeanArt  Hoss M.D.   On: 12/18/2013 13:06   Ct Head Wo Contrast  12/18/2013   CLINICAL DATA:  Altered mental status, failure to thrive  EXAM: CT HEAD WITHOUT CONTRAST  TECHNIQUE: Contiguous axial images were obtained from the base of the skull through the vertex without intravenous contrast.  COMPARISON:  None.  FINDINGS: Mild cortical volume loss noted with proportional ventricular prominence. Areas of periventricular white matter hypodensity are most compatible with small vessel ischemic change. No acute hemorrhage, infarct, or mass lesion is identified. Right maxillary opacification is noted with mild ethmoid mucoperiosteal thickening. No skull fracture.  IMPRESSION: No acute intracranial finding.  Chronic findings as above.  Right maxillary sinusitis.   Electronically Signed   By: Christiana Pellant M.D.   On: 12/18/2013 12:52     EKG: Independently reviewed. Afib  ASSESSMENT AND PLAN: Present on Admission:  . PNA (pneumonia) - Suspect aspiration pneumonia, start IV Unasyn, and Zithromax for atypical coverage. Will get a speech evaluation to make sure she is an appropriate diet, but suspect this is likely secondary to alcohol intoxication. Continue to monitor and follow clinically.   . Hypokalemia - Multifactorial, likely secondary to Lasix and alcohol use. - Already given 40 mEq  of KCl in the emergency room, we'll add another 20 mEq orally now, add KCl to IV fluids, and give another 40 mEq orally this evening. Check magnesium level. Repeat potassium levels tomorrow   . Alcohol abuse - After discussion with patient, it seems that this patient has been using alcohol primarily to sleep. Although she thinks she only drank one small glass of bourbon last night, she thinks it's possible that she could have had a few more glasses-she claims she was found in the same chair this morning that she sat to drink last night. For now will place on Ativan per CIWA protocol and monitor closely. - Unfortunately this patient lives alone although her nephew checks on an daily basis, she will need social worker evaluation to assess her living circumstances/situation prior to discharge home and possible involvement of Adult Management consultant. Will get PT evaluation, as the patient probably would benefit from skilled nursing facility placement.  . Hypertension - Cautiously continue with Lasix (start tomorrow), metoprolol and losartan. Follow BP trend and adjust accordingly.  Marland Kitchen Hyperlipidemia - Continue statins   . Chronic diastolic heart failure - Clinically compensated, continue Lasix-starting tomorrow. Will gently hydrate overnight.  . Atrial fibrillation - Apparently has had frequent falls over the past few months, maintained on Pradaxa. Obtain a PT eval,- may need to stop Pradaxa if she is going to be discharged home given history of frequent calls and alcohol use, however if she is going to SNF, we can probably continue Pradaxa very cautiously. However await PT eval first  . Depression - Patient claims that she feels very depressed, all her close friends and family members have died. She claims that she drinks alcohol in order to sleep. Will obtain psych eval.  . Frequent falls - See above-CT head negative. Lives alone, will get PT evaluation  . Right forearm superficial wounds -  Patient claims that she sustained these superficial cuts/bruises and ulcerations from falling. Will ask wound care evaluation.  Further plan will depend as patient's clinical course evolves and further radiologic and laboratory data become available. Patient will be monitored closely.   Above noted plan was discussed with patient, she was in agreement.   DVT Prophylaxis: Not needed as on Pradaxa  Code Status: Full Code  Total time spent for admission equals 45 minutes.  [facility] Pager [phone]  If 7PM-7AM, please contact night-coverage [email or web address] Password [email or web address] 12/18/2013, 3:27 PM  **Disclaimer: This note may have  been dictated with voice recognition software. Similar sounding words can inadvertently be transcribed and this note may contain transcription errors which may not have been corrected upon publication of note.**

## 2013-12-19 DIAGNOSIS — Z9181 History of falling: Secondary | ICD-10-CM

## 2013-12-19 DIAGNOSIS — F3289 Other specified depressive episodes: Secondary | ICD-10-CM

## 2013-12-19 DIAGNOSIS — F329 Major depressive disorder, single episode, unspecified: Secondary | ICD-10-CM

## 2013-12-19 LAB — BASIC METABOLIC PANEL
Anion gap: 13 (ref 5–15)
BUN: 14 mg/dL (ref 6–23)
CO2: 25 mEq/L (ref 19–32)
CREATININE: 0.95 mg/dL (ref 0.50–1.10)
Calcium: 9 mg/dL (ref 8.4–10.5)
Chloride: 107 mEq/L (ref 96–112)
GFR calc Af Amer: 62 mL/min — ABNORMAL LOW (ref >90)
GFR, EST NON AFRICAN AMERICAN: 54 mL/min — AB (ref >90)
GLUCOSE: 133 mg/dL — AB (ref 70–99)
Potassium: 5.3 mEq/L (ref 3.7–5.3)
SODIUM: 145 meq/L (ref 137–147)

## 2013-12-19 LAB — CBC
HCT: 40.8 % (ref 36.0–46.0)
Hemoglobin: 13 g/dL (ref 12.0–15.0)
MCH: 32.1 pg (ref 26.0–34.0)
MCHC: 31.9 g/dL (ref 30.0–36.0)
MCV: 100.7 fL — AB (ref 78.0–100.0)
Platelets: 235 10*3/uL (ref 150–400)
RBC: 4.05 MIL/uL (ref 3.87–5.11)
RDW: 15 % (ref 11.5–15.5)
WBC: 9.2 10*3/uL (ref 4.0–10.5)

## 2013-12-19 LAB — LEGIONELLA ANTIGEN, URINE: Legionella Antigen, Urine: NEGATIVE

## 2013-12-19 MED ORDER — CEFTRIAXONE SODIUM 1 G IJ SOLR
1.0000 g | Freq: Once | INTRAMUSCULAR | Status: DC
  Filled 2013-12-19: qty 10

## 2013-12-19 MED ORDER — TRAZODONE HCL 100 MG PO TABS
100.0000 mg | ORAL_TABLET | Freq: Every day | ORAL | Status: DC
Start: 2013-12-19 — End: 2013-12-20
  Administered 2013-12-19: 100 mg via ORAL
  Filled 2013-12-19 (×2): qty 1

## 2013-12-19 MED ORDER — MAGNESIUM SULFATE 40 MG/ML IJ SOLN
2.0000 g | Freq: Once | INTRAMUSCULAR | Status: AC
  Administered 2013-12-19: 2 g via INTRAVENOUS
  Filled 2013-12-19: qty 50

## 2013-12-19 NOTE — Care Management Note (Addendum)
    Page 1 of 2   12/20/2013     12:18:53 PM CARE MANAGEMENT NOTE 12/20/2013  Patient:  Sydney Hensley,Sydney Hensley   Account Number:  [id number]  Date Initiated:  12/19/2013  Documentation initiated by:  HUTCHINSON,CRYSTAL  Subjective/Objective Assessment:   PNA (pneumonia) ? Asp, Depression  admit- lives alone.     Action/Plan:   CM to follow for dispositon needs  psych eval.  pt refuses snf.   Anticipated DC Date:  12/20/2013   Anticipated DC Plan:  HOME W HOME HEALTH SERVICES      DC Planning Services  CM consult      [facility]   Choice offered to / List presented to:  C-1 Patient        HH arranged  HH-1 RN  HH-2 PT  HH-6 SOCIAL WORKER      HH agency  [facility]   Status of service:  Completed, signed off Medicare Important Message given?  NA - LOS <3 / Initial given by admissions (If response is "NO", the following Medicare IM given date fields will be blank) Date Medicare IM given:   Medicare IM given by:   Date Additional Medicare IM given:   Additional Medicare IM given by:    Discharge Disposition:  HOME W HOME HEALTH SERVICES  Per UR Regulation:  Reviewed for med. necessity/level of care/duration of stay  If discussed at Long Length of Stay Meetings, dates discussed:    Comments:  8/4!5 1106 Letha Capeeborah Johnpaul Gillentine RN, BSN [phone] patient lives alone, [facility], and Social Worker, [facility] , Lupita LeashDonna notiifed.  Soc will begin 24-48 hrs post dc.  Crystal Hutchinson RN, BSN, MSHL, CCM  Nurse - Case Manager,  ([phone])  [phone]  12/19/2013 Initial IM 12/18/13 provided by admissions Hx/o ETOH abuse Disposition Plan:  pending

## 2013-12-19 NOTE — Clinical Social Work Psychosocial (Signed)
Clinical Social Work Department BRIEF PSYCHOSOCIAL ASSESSMENT 12/19/2013  Patient:  Sydney Hensley, Sydney Hensley     Account Number:  [id number]     Admit date:  12/18/2013  Clinical Social Worker:  Lovey Newcomer  Date/Time:  12/19/2013 10:22 AM  Referred by:  Physician  Date Referred:  12/19/2013 Referred for  SNF Placement   Other Referral:   Interview type:  Patient Other interview type:   Patient alert and oriented at time of assessment.    PSYCHOSOCIAL DATA Living Status:  ALONE Admitted from facility:   Level of care:   Primary support name:  Nathaneil Canary Primary support relationship to patient:  FAMILY Degree of support available:   Support is good.    CURRENT CONCERNS Current Concerns  Post-Acute Placement   Other Concerns:    SOCIAL WORK ASSESSMENT / PLAN CSW met with patient and nephew at bedside to complete assessment. Patient states that she was admitted from home. CSW explained that PT will likely recommend SNF placement for patient at discharge for short term rehab. CSW inquired about whether or not patient would be willing to go to a facility for short term rehab. Patient and nephew adamantly refuse SNF placement. CSW inquired about patient's home environment and level of supervision. Patient states that she feels safe in her home and that her nephew is able to be with her to care for her since he is retired. Nephew confirmed this.   Assessment/plan status:  No Further Intervention Required Other assessment/ plan:   NONE   Information/referral to community resources:   NONE    PATIENT'S/FAMILY'S RESPONSE TO PLAN OF CARE: Patient and nephew are adamantly refusing SNF placement. No other CSW needs noted at this time. CSW will sign off.       Liz Beach MSW, [place], [place], [phone]

## 2013-12-19 NOTE — Evaluation (Signed)
Physical Therapy Evaluation Patient Details Name: Sydney Hensley MRN: [id number] DOB: April 27, 1928 Today's Date: 12/19/2013   History of Present Illness  Sydney Hensley is a 80 y.o. female with a Past Medical History of chronic diastolic heart failure, atrial fibrillation on Pradaxa, hypertension, dyslipidemia who presents today with the above noted complaint. Please note, no family members at bedside, this M.D. tried calling niece and nephew listed as next of kin-unsuccessful in contacting them. Most of this history is obtained from the patient, and from the ED chart. Per patient, she lives alone and has no children of her own. Her nephew lives nearby and checks on her on a daily basis it she claims she has no other immediate family members, and just wants to "die". She claims that she has trouble with insomnia for the past numerous months, and drinks a glass of "bourbon" every night to help her sleep. She claims that she has been doing this for many months. She apparently was found by her nephew earlier today on a chair, very weak, crying and sobbing.   Clinical Impression  Pt admitted with above. Pt currently with functional limitations due to the deficits listed below (see PT Problem List).  Pt will benefit from skilled PT to increase their independence and safety with mobility to allow discharge to the venue listed below. Spoke to patient and nephew and she is not interested in going to rehab.  Pt is agreeable to HHPT and to use RW due to weakness and balance deficits.  Spoke with nephew and encouraged him to try and stay with pt more often when she first gets home.  He verbalized understanding.     Follow Up Recommendations Home health PT    Equipment Recommendations  None recommended by PT    Recommendations for Other Services       Precautions / Restrictions Precautions Precautions: Fall Precaution Comments: 3 falls in last 6 months Restrictions Weight Bearing Restrictions: No       Mobility  Bed Mobility Overal bed mobility: Needs Assistance Bed Mobility: Supine to Sit     Supine to sit: Min guard     General bed mobility comments: Pt performed with HOB elevated and HOB flat with heavy use of rail.  Pt reports she sleeps on couch and it is much easier to get up from than the hospital bed.  Transfers Overall transfer level: Needs assistance   Transfers: Sit to/from Stand Sit to Stand: Min guard         General transfer comment: Took 2 attempts to achieve standing.  Ambulation/Gait Ambulation/Gait assistance: Min guard Ambulation Distance (Feet): 125 Feet (plus 50' without AD) Assistive device: Rolling walker (2 wheeled)       General Gait Details: Amb without AD 50' with decreased balance, short step length, and 1 LOB.  Then swtiched to using RW and pt with much improvement in cadence, step length, and overall balance.  Pt verbalized that she did feel steadier with the RW.  o2 94% on room air after gait.  HR 95.  Stairs            Wheelchair Mobility    Modified Rankin (Stroke Patients Only)       Balance Overall balance assessment: Needs assistance   Sitting balance-Leahy Scale: Good     Standing balance support: No upper extremity supported Standing balance-Leahy Scale: Poor  Pertinent Vitals/Pain 98%o on 1 L/min, 94% on RA, HR 95 after gait.    Home Living Family/patient expects to be discharged to:: Private residence Living Arrangements: Alone Available Help at Discharge: Family;Available PRN/intermittently Type of Home: House Home Access: Stairs to enter   Entrance Stairs-Number of Steps: 1 Home Layout: One level Home Equipment: Walker - 2 wheels;Cane - single point;Toilet riser      Prior Function Level of Independence: Independent               Hand Dominance        Extremity/Trunk Assessment   Upper Extremity Assessment: Generalized weakness            Lower Extremity Assessment: Generalized weakness      Cervical / Trunk Assessment: Normal  Communication   Communication: No difficulties  Cognition Arousal/Alertness: Awake/alert Behavior During Therapy: WFL for tasks assessed/performed Overall Cognitive Status: Within Functional Limits for tasks assessed                      General Comments      Exercises        Assessment/Plan    PT Assessment Patient needs continued PT services  PT Diagnosis Difficulty walking   PT Problem List Decreased balance;Decreased mobility;Decreased knowledge of use of DME;Decreased strength  PT Treatment Interventions Gait training;Functional mobility training;Therapeutic activities;Therapeutic exercise;Balance training;Patient/family education;DME instruction   PT Goals (Current goals can be found in the Care Plan section) Acute Rehab PT Goals Patient Stated Goal: Go home PT Goal Formulation: With patient/family Time For Goal Achievement: 12/26/13 Potential to Achieve Goals: Good    Frequency Min 3X/week   Barriers to discharge Decreased caregiver support lives alone but nephew comes and checks on daily.    Co-evaluation               End of Session Equipment Utilized During Treatment: Gait belt Activity Tolerance: Patient tolerated treatment well Patient left: in chair;with family/visitor present;with chair alarm set           Time: [phone] PT Time Calculation (min): 31 min   Charges:   PT Evaluation $Initial PT Evaluation Tier I: 1 Procedure PT Treatments $Gait Training: 23-37 mins   PT G Codes:          Conor Filsaime LUBECK 12/19/2013, 10:27 AM

## 2013-12-19 NOTE — Consult Note (Signed)
WOC wound consult note Reason for Consult: Skin abrasion to right arm Wound type: partial thickness skin tear Measurement: right arm: 3cmx1X.1cm Wound bed: 40% dark red dried blood, 60% yellow woundbed Drainage: scant serosanguineous drainage, no odor Periwound:intact friable edges  Dressing procedure/placement/frequency: periwound cleaned and foam dressing applied. Pt educated on foam dressing and frequency of changes.   Charlesetta GaribaldiJody Harris, RN, BSN, MSN-Student Please re-consult if further assistance is needed.  Thank-you,  Cammie Mcgeeawn Laurelyn Terrero MSN, RN, CWOCN, MarvinWCN-AP, CNS [phone]

## 2013-12-19 NOTE — Progress Notes (Signed)
Visitor states that she was trying to help with the patients gown and she unscrewed it and hooked it back up. I explained to the family that they needed to call staff to do this because the hub has to be properly cleaned before it is hooked back up to prevent any infection of the veins. Visitor demonstrated understanding but the patient states that she did not care. I will continue to monitor.   Mical Kicklighter J.Joanna HewsSullivan RN, MSN

## 2013-12-19 NOTE — Progress Notes (Signed)
UR completed Iniko Robles K. Yanci Bachtell, RN, BSN, MSHL, CCM  12/19/2013 12:55 PM

## 2013-12-19 NOTE — Consult Note (Signed)
[facility] Face-to-Face Psychiatry Consult   Reason for Consult:  Capacity evaluation, alcohol abuse and depression Referring Physician:  Dr. Cherre Blanc is an.80 y.o. female. Total Time spent with patient: 45 minutes  Assessment: AXIS I:  Alcohol Abuse and Depressive Disorder NOS AXIS II:  Deferred AXIS III:   Past Medical History  Diagnosis Date  . Glaucoma   . Hypertension   . Dysrhythmia   . Atrial fibrillation   . CHF (congestive heart failure)     Diastolic dysfunction   AXIS IV:  other psychosocial or environmental problems, problems related to social environment and problems with primary support group AXIS V:  51-60 moderate symptoms  Plan: Case discussed with Dr. Sloan Leiter Patient meets criteria for capacity to make her own medical decisions and has no known safety concerns No evidence of imminent risk to self or others at present.   Patient does not meet criteria for psychiatric inpatient admission. Supportive therapy provided about ongoing stressors. Discussed crisis plan, support from social network, calling 911, coming to the Emergency Department, and calling Suicide Hotline. Referred to the outpatient psychiatric services upon discharge, may prescribe trazodone 50 mg at bedtime for insomnia  Subjective:   Sydney Hensley is a 80 y.o. female patient admitted with alcohol abuse and depression.  HPI: Patient is seen, chart reviewed and case discussed with the Dr. Sloan Leiter. Patient reported she has been struggling with the insomnia and her primary care physician recommended over the counter medication which was not helpful. Patient started drinking alcohol daily night. Patient denies symptoms of depression, anxiety, she said or homicidal ideation, intention or plans. Patient has no evidence of psychotic symptoms. Patient has a friend who has been in good communication with her and also has a nephew and niece who has been visiting her frequently. Patient denied  cravings for alcohol and significant symptoms. Patient reported this wound to stop drinking as Doctor recommended and willing to take sleeping medication prescribed to her which had lost. Patient blood alcohol level is 214  on admission and minimizes the need for treatment.  Medical history: Sydney Hensley is a 80 y.o. female with a Past Medical History of chronic diastolic heart failure, atrial fibrillation on Pradaxa, hypertension, dyslipidemia who presents today with the above noted complaint. Please note, no family members at bedside, this M.D. tried calling niece and nephew listed as next of kin-unsuccessful in contacting them. Most of this history is obtained from the patient, and from the ED chart. Per patient, she lives alone and has no children of her own. Her nephew lives nearby and checks on her on a daily basis it she claims she has no other immediate family members, and just wants to "die". She claims that she has trouble with insomnia for the past numerous months, and drinks a glass of "bourbon" every night to help her sleep. She claims that she has been doing this for many months. She apparently was found by her nephew earlier today on a chair, very weak, crying and sobbing. She was then brought to the emergency room for further evaluation, where x-ray showed a right upper lobe infiltrate, a alcohol level was 214. She was also found to be severely hypokalemic. I was subsequently asked to admit this patient for further evaluation and treatment. During my evaluation, patient claims that she thinks she only drank one glass of bourbon last night, but thinks she could have drank more as she was found in the chair that she sat  on to drink (last evening) this morning. He also claims that over the past few months she has fallen at least 2-3 times a month. She denies any recent fever, cough, shortness of breath, nausea, vomiting or diarrhea  HPI Elements:   Location:  Alcohol abuse and  depression. Quality:  Moderate. Severity:  Mild. Timing:  Insomnia and alcohol abuse.  Past Psychiatric History: Past Medical History  Diagnosis Date  . Glaucoma   . Hypertension   . Dysrhythmia   . Atrial fibrillation   . CHF (congestive heart failure)     Diastolic dysfunction    reports that she has quit smoking. She has never used smokeless tobacco. She reports that she does not drink alcohol or use illicit drugs. Family History  Problem Relation Age of Onset  . Cancer Father   . CVA Mother      Living Arrangements: Alone   Abuse/Neglect [facility]) Physical Abuse: Denies Verbal Abuse: Denies Sexual Abuse: Denies Allergies:   Allergies  Allergen Reactions  . Lisinopril Other (See Comments)    Cough    ACT Assessment Complete:  No Objective: Blood pressure 159/86, pulse 80, temperature 98.1 F (36.7 C), temperature source Oral, resp. rate 15, height _0  (1.753 m), weight 64.139 kg (141 lb 6.4 oz), SpO2 92.00%.Body mass index is 20.87 kg/(m^2). Results for orders placed during the hospital encounter of 12/18/13 (from the past 72 hour(s))  BASIC METABOLIC PANEL     Status: Abnormal   Collection Time    12/18/13 11:45 AM      Result Value Ref Range   Sodium 142  137 - 147 mEq/L   Potassium 2.7 (*) 3.7 - 5.3 mEq/L   Comment: CRITICAL RESULT CALLED TO, READ BACK BY AND VERIFIED WITH:     MOYER M,RN 12/18/13 1258 WAYK   Chloride 99  96 - 112 mEq/L   CO2 26  19 - 32 mEq/L   Glucose, Bld 101 (*) 70 - 99 mg/dL   BUN 13  6 - 23 mg/dL   Creatinine, Ser 1.09  0.50 - 1.10 mg/dL   Calcium 8.9  8.4 - 10.5 mg/dL   GFR calc non Af Amer 46 (*) >90 mL/min   GFR calc Af Amer 53 (*) >90 mL/min   Comment: (NOTE)     The eGFR has been calculated using the CKD EPI equation.     This calculation has not been validated in all clinical situations.     eGFR's persistently <90 mL/min signify possible Chronic Kidney     Disease.   Anion gap 17 (*) 5 - 15  CBC WITH DIFFERENTIAL      Status: None   Collection Time    12/18/13 11:45 AM      Result Value Ref Range   WBC 5.2  4.0 - 10.5 K/uL   RBC 3.94  3.87 - 5.11 MIL/uL   Hemoglobin 12.5  12.0 - 15.0 g/dL   HCT 39.3  36.0 - 46.0 %   MCV 99.7  78.0 - 100.0 fL   MCH 31.7  26.0 - 34.0 pg   MCHC 31.8  30.0 - 36.0 g/dL   RDW 14.6  11.5 - 15.5 %   Platelets 217  150 - 400 K/uL   Neutrophils Relative % 54  43 - 77 %   Neutro Abs 2.8  1.7 - 7.7 K/uL   Lymphocytes Relative 36  12 - 46 %   Lymphs Abs 1.9  0.7 - 4.0 K/uL  Monocytes Relative 8  3 - 12 %   Monocytes Absolute 0.4  0.1 - 1.0 K/uL   Eosinophils Relative 1  0 - 5 %   Eosinophils Absolute 0.1  0.0 - 0.7 K/uL   Basophils Relative 1  0 - 1 %   Basophils Absolute 0.0  0.0 - 0.1 K/uL  PROTIME-INR     Status: Abnormal   Collection Time    12/18/13 11:45 AM      Result Value Ref Range   Prothrombin Time 15.8 (*) 11.6 - 15.2 seconds   INR 1.26  0.00 - 1.49  TSH     Status: None   Collection Time    12/18/13 11:45 AM      Result Value Ref Range   TSH 0.720  0.350 - 4.500 uIU/mL  ACETAMINOPHEN LEVEL     Status: None   Collection Time    12/18/13 11:45 AM      Result Value Ref Range   Acetaminophen (Tylenol), Serum <15.0  10 - 30 ug/mL   Comment:            THERAPEUTIC CONCENTRATIONS VARY     SIGNIFICANTLY. A RANGE OF 10-30     ug/mL MAY BE AN EFFECTIVE     CONCENTRATION FOR MANY PATIENTS.     HOWEVER, SOME ARE BEST TREATED     AT CONCENTRATIONS OUTSIDE THIS     RANGE.     ACETAMINOPHEN CONCENTRATIONS     >150 ug/mL AT 4 HOURS AFTER     INGESTION AND >50 ug/mL AT 12     HOURS AFTER INGESTION ARE     OFTEN ASSOCIATED WITH TOXIC     REACTIONS.  SALICYLATE LEVEL     Status: Abnormal   Collection Time    12/18/13 11:45 AM      Result Value Ref Range   Salicylate Lvl <0.1 (*) 2.8 - 20.0 mg/dL  ETHANOL     Status: Abnormal   Collection Time    12/18/13 11:45 AM      Result Value Ref Range   Alcohol, Ethyl (B) 214 (*) 0 - 11 mg/dL   Comment:             LOWEST DETECTABLE LIMIT FOR     SERUM ALCOHOL IS 11 mg/dL     FOR MEDICAL PURPOSES ONLY  I-STAT TROPOININ, ED     Status: None   Collection Time    12/18/13 12:17 PM      Result Value Ref Range   Troponin i, poc 0.02  0.00 - 0.08 ng/mL   Comment 3            Comment: Due to the release kinetics of cTnI,     a negative result within the first hours     of the onset of symptoms does not rule out     myocardial infarction with certainty.     If myocardial infarction is still suspected,     repeat the test at appropriate intervals.  URINALYSIS, ROUTINE W REFLEX MICROSCOPIC     Status: Abnormal   Collection Time    12/18/13 12:40 PM      Result Value Ref Range   Color, Urine YELLOW  YELLOW   APPearance CLOUDY (*) CLEAR   Specific Gravity, Urine 1.015  1.005 - 1.030   pH 5.5  5.0 - 8.0   Glucose, UA NEGATIVE  NEGATIVE mg/dL   Hgb urine dipstick TRACE (*) NEGATIVE   Bilirubin Urine NEGATIVE  NEGATIVE   Ketones, ur NEGATIVE  NEGATIVE mg/dL   Protein, ur 30 (*) NEGATIVE mg/dL   Urobilinogen, UA 1.0  0.0 - 1.0 mg/dL   Nitrite POSITIVE (*) NEGATIVE   Leukocytes, UA MODERATE (*) NEGATIVE  URINE CULTURE     Status: None   Collection Time    12/18/13 12:40 PM      Result Value Ref Range   Specimen Description URINE, RANDOM     Special Requests NONE     Culture  Setup Time       Value: 12/18/2013 13:17     Performed at [facility] Count       Value: >=100,000 COLONIES/ML     Performed at [facility]   Culture       Value: [place]     Performed at [facility]   Report Status PENDING    URINE RAPID DRUG SCREEN (HOSP PERFORMED)     Status: None   Collection Time    12/18/13 12:40 PM      Result Value Ref Range   Opiates NONE DETECTED  NONE DETECTED   Cocaine NONE DETECTED  NONE DETECTED   Benzodiazepines NONE DETECTED  NONE DETECTED   Amphetamines NONE DETECTED  NONE DETECTED   Tetrahydrocannabinol NONE DETECTED  NONE DETECTED    Barbiturates NONE DETECTED  NONE DETECTED   Comment:            DRUG SCREEN FOR MEDICAL PURPOSES     ONLY.  IF CONFIRMATION IS NEEDED     FOR ANY PURPOSE, NOTIFY LAB     WITHIN 5 DAYS.                LOWEST DETECTABLE LIMITS     FOR URINE DRUG SCREEN     Drug Class       Cutoff (ng/mL)     Amphetamine      1000     Barbiturate      200     Benzodiazepine   229     Tricyclics       798     Opiates          300     Cocaine          300     THC              50  URINE MICROSCOPIC-ADD ON     Status: Abnormal   Collection Time    12/18/13 12:40 PM      Result Value Ref Range   Squamous Epithelial / LPF RARE  RARE   WBC, UA 11-20  <3 WBC/hpf   RBC / HPF 0-2  <3 RBC/hpf   Bacteria, UA MANY (*) RARE  CULTURE, BLOOD (ROUTINE X 2)     Status: None   Collection Time    12/18/13  2:25 PM      Result Value Ref Range   Specimen Description BLOOD RIGHT HAND     Special Requests BOTTLES DRAWN AEROBIC AND ANAEROBIC 10CC     Culture  Setup Time       Value: 12/18/2013 20:46     Performed at [facility]   Culture       Value:        BLOOD CULTURE RECEIVED NO GROWTH TO DATE CULTURE WILL BE HELD FOR 5 DAYS BEFORE ISSUING A FINAL NEGATIVE REPORT     Performed at [facility]  Report Status PENDING    CULTURE, BLOOD (ROUTINE X 2)     Status: None   Collection Time    12/18/13  2:40 PM      Result Value Ref Range   Specimen Description BLOOD LEFT ARM     Special Requests BOTTLES DRAWN AEROBIC AND ANAEROBIC 10CC     Culture  Setup Time       Value: 12/18/2013 20:46     Performed at [facility]   Culture       Value:        BLOOD CULTURE RECEIVED NO GROWTH TO DATE CULTURE WILL BE HELD FOR 5 DAYS BEFORE ISSUING A FINAL NEGATIVE REPORT     Performed at [facility]   Report Status PENDING    MAGNESIUM     Status: None   Collection Time    12/18/13  2:40 PM      Result Value Ref Range   Magnesium 1.6  1.5 - 2.5 mg/dL  I-STAT CG4 LACTIC ACID, ED     Status:  Abnormal   Collection Time    12/18/13  2:48 PM      Result Value Ref Range   Lactic Acid, Venous 2.34 (*) 0.5 - 2.2 mmol/L  CULTURE, BLOOD (ROUTINE X 2)     Status: None   Collection Time    12/18/13  4:20 PM      Result Value Ref Range   Specimen Description BLOOD LEFT FOREARM     Special Requests BOTTLES DRAWN AEROBIC AND ANAEROBIC 10CC     Culture  Setup Time       Value: 12/18/2013 20:46     Performed at [facility]   Culture       Value:        BLOOD CULTURE RECEIVED NO GROWTH TO DATE CULTURE WILL BE HELD FOR 5 DAYS BEFORE ISSUING A FINAL NEGATIVE REPORT     Performed at [facility]   Report Status PENDING    CULTURE, BLOOD (ROUTINE X 2)     Status: None   Collection Time    12/18/13  4:25 PM      Result Value Ref Range   Specimen Description BLOOD LEFT WRIST     Special Requests BOTTLES DRAWN AEROBIC ONLY 10CC     Culture  Setup Time       Value: 12/18/2013 20:46     Performed at [facility]   Culture       Value:        BLOOD CULTURE RECEIVED NO GROWTH TO DATE CULTURE WILL BE HELD FOR 5 DAYS BEFORE ISSUING A FINAL NEGATIVE REPORT     Performed at [facility]   Report Status PENDING    LEGIONELLA ANTIGEN, URINE     Status: None   Collection Time    12/18/13 10:43 PM      Result Value Ref Range   Specimen Description URINE, RANDOM     Special Requests NONE     Legionella Antigen, Urine       Value: Negative for Legionella pneumophilia serogroup 1     Performed at [facility]   Report Status 12/19/2013 FINAL    STREP PNEUMONIAE URINARY ANTIGEN     Status: None   Collection Time    12/18/13 10:44 PM      Result Value Ref Range   Strep Pneumo Urinary Antigen NEGATIVE  NEGATIVE   Comment:  Infection due to S. pneumoniae     cannot be absolutely ruled out     since the antigen present     may be below the detection limit     of the test.  BASIC METABOLIC PANEL     Status: Abnormal   Collection Time     12/19/13  6:27 AM      Result Value Ref Range   Sodium 145  137 - 147 mEq/L   Potassium 5.3  3.7 - 5.3 mEq/L   Comment: DELTA CHECK NOTED     NO VISIBLE HEMOLYSIS   Chloride 107  96 - 112 mEq/L   Comment: DELTA CHECK NOTED   CO2 25  19 - 32 mEq/L   Glucose, Bld 133 (*) 70 - 99 mg/dL   BUN 14  6 - 23 mg/dL   Creatinine, Ser 0.95  0.50 - 1.10 mg/dL   Calcium 9.0  8.4 - 10.5 mg/dL   GFR calc non Af Amer 54 (*) >90 mL/min   GFR calc Af Amer 62 (*) >90 mL/min   Comment: (NOTE)     The eGFR has been calculated using the CKD EPI equation.     This calculation has not been validated in all clinical situations.     eGFR's persistently <90 mL/min signify possible Chronic Kidney     Disease.   Anion gap 13  5 - 15  CBC     Status: Abnormal   Collection Time    12/19/13  6:27 AM      Result Value Ref Range   WBC 9.2  4.0 - 10.5 K/uL   RBC 4.05  3.87 - 5.11 MIL/uL   Hemoglobin 13.0  12.0 - 15.0 g/dL   HCT 40.8  36.0 - 46.0 %   MCV 100.7 (*) 78.0 - 100.0 fL   MCH 32.1  26.0 - 34.0 pg   MCHC 31.9  30.0 - 36.0 g/dL   RDW 15.0  11.5 - 15.5 %   Platelets 235  150 - 400 K/uL   Labs are reviewed and are pertinent for  increased blood alcohol level to 215 .  Current Facility-Administered Medications  Medication Dose Route Frequency Provider Last Rate Last Dose  . acetaminophen (TYLENOL) tablet 650 mg  650 mg Oral Q6H PRN Shanker Kristeen Mans, MD       Or  . acetaminophen (TYLENOL) suppository 650 mg  650 mg Rectal Q6H PRN Shanker Kristeen Mans, MD      . albuterol (PROVENTIL) (2.5 MG/3ML) 0.083% nebulizer solution 2.5 mg  2.5 mg Nebulization Q2H PRN Shanker Kristeen Mans, MD      . alum & mag hydroxide-simeth (MAALOX/MYLANTA) 200-200-20 MG/5ML suspension 30 mL  30 mL Oral Q6H PRN Shanker Kristeen Mans, MD      . Ampicillin-Sulbactam (UNASYN) 3 g in sodium chloride 0.9 % 100 mL IVPB  3 g Intravenous Q8H Georgina Peer, RPH   3 g at 12/19/13 1107  . azithromycin (ZITHROMAX) 500 mg in dextrose 5 % 250 mL  IVPB  500 mg Intravenous Q24H Shanker Kristeen Mans, MD      . dabigatran (PRADAXA) capsule 150 mg  150 mg Oral BID Jonetta Osgood, MD   150 mg at 12/19/13 1105  . folic acid (FOLVITE) tablet 1 mg  1 mg Oral Daily Jonetta Osgood, MD   1 mg at 12/19/13 1105  . furosemide (LASIX) tablet 40 mg  40 mg Oral Daily Shanker Kristeen Mans, MD  40 mg at 12/19/13 1105  . guaiFENesin-dextromethorphan (ROBITUSSIN DM) 100-10 MG/5ML syrup 5 mL  5 mL Oral Q4H PRN Shanker Kristeen Mans, MD      . latanoprost (XALATAN) 0.005 % ophthalmic solution 1 drop  1 drop Both Eyes BID Jonetta Osgood, MD   1 drop at 12/19/13 1300  . LORazepam (ATIVAN) tablet 1 mg  1 mg Oral Q6H PRN Jonetta Osgood, MD       Or  . LORazepam (ATIVAN) injection 1 mg  1 mg Intravenous Q6H PRN Jonetta Osgood, MD      . losartan (COZAAR) tablet 50 mg  50 mg Oral BID Jonetta Osgood, MD   50 mg at 12/19/13 1106  . metoprolol tartrate (LOPRESSOR) tablet 50 mg  50 mg Oral BID Jonetta Osgood, MD   50 mg at 12/19/13 1107  . multivitamin with minerals tablet 1 tablet  1 tablet Oral Daily Jonetta Osgood, MD   1 tablet at 12/19/13 1105  . ondansetron (ZOFRAN) tablet 4 mg  4 mg Oral Q6H PRN Jonetta Osgood, MD       Or  . ondansetron [facility]) injection 4 mg  4 mg Intravenous Q6H PRN Jonetta Osgood, MD   4 mg at 12/18/13 2349  . oxyCODONE (Oxy IR/ROXICODONE) immediate release tablet 5 mg  5 mg Oral Q4H PRN Jonetta Osgood, MD   5 mg at 12/18/13 2239  . senna (SENOKOT) tablet 8.6 mg  1 tablet Oral BID Jonetta Osgood, MD   8.6 mg at 12/19/13 1106  . simvastatin (ZOCOR) tablet 10 mg  10 mg Oral q1800 Jonetta Osgood, MD   10 mg at 12/18/13 1745  . thiamine (VITAMIN B-1) tablet 100 mg  100 mg Oral Daily Jonetta Osgood, MD   100 mg at 12/19/13 1106  . traZODone (DESYREL) tablet 100 mg  100 mg Oral QHS Melton Alar, PA-C        Psychiatric Specialty Exam: Physical ExamAs per history and physical   Review of Systems   Psychiatric/Behavioral: Positive for depression and substance abuse. The patient has insomnia.     Blood pressure 159/86, pulse 80, temperature 98.1 F (36.7 C), temperature source Oral, resp. rate 15, height _0  (1.753 m), weight 64.139 kg (141 lb 6.4 oz), SpO2 92.00%.Body mass index is 20.87 kg/(m^2).  General Appearance: Casual  Eye Contact::  Good  Speech:  Clear and Coherent  Volume:  Decreased  Mood:  Depressed  Affect:  Appropriate and Congruent  Thought Process:  Coherent and Goal Directed  Orientation:  Full (Time, Place, and Person)  Thought Content:  WDL  Suicidal Thoughts:  No  Homicidal Thoughts:  No  Memory:  Immediate;   Good Recent;   Good  Judgement:  Intact  Insight:  Fair  Psychomotor Activity:  Decreased  Concentration:  Fair  Recall:  [place] of Knowledge:Good  Language: Good  Akathisia:  NA  Handed:  Right  AIMS (if indicated):     Assets:  Communication Skills Desire for Improvement Financial Resources/Insurance Housing Intimacy Leisure Time Resilience Social Support Talents/Skills  Sleep:      Musculoskeletal: Strength & Muscle Tone: decreased Gait & Station: unsteady Patient leans: N/A  Treatment Plan Summary: Daily contact with patient to assess and evaluate symptoms and progress in treatment Medication management Recommended trazodone 50 mg at bedtime for insomnia and stays sober from alcohol Follow up with outpatient psychiatric services upon medically cleared  Sydney Hensley,JANARDHAHA R. 12/19/2013 3:58 PM

## 2013-12-19 NOTE — Progress Notes (Addendum)
PATIENT DETAILS Name: Sydney Hensley Age: 80 y.o. Sex: female Date of Birth: [birth year] Admit Date: 12/18/2013 Admitting Physician Dewayne Shorter Levora Dredge, MD VHQ:IONGEX,BMWU A, MD  Subjective: No complaints. Tolerated diet yesterday, had BM this morning. Denies SOB.   Assessment/Plan: . PNA (pneumonia)  - Suspect aspiration pneumonia given alcohol intoxication. - Started IV Unasyn, and Zithromax for atypical coverage-day 2. Seen by SLP-to remain on Dys 3 diet. Clinically improved, without any leukocytosis or fever. -Blood culture on 8/2 neg so far.Urine S.Pneumo antigen negative Urine Legionella antigen pending - Continue to monitor and follow clinically.   . Insomnia -Reports she drinks alcohol to sleep at night -Did well with trazodone last night, will plan on continuing this on discharge. -Psychiatry to see 8/3.    Marland Kitchen Hypokalemia  - Multifactorial, likely secondary to Lasix and alcohol use.  - Corrected with supplementation. - continue to monitor with restart of Lasix   . Alcohol abuse  - No signs of alcohol withdrawal - don't anticipate alcohol usage extends into daytime use.Continue with Ativan per CIWA protocol and monitor closely. - After discussion with patient, it seems that this patient has been using alcohol primarily to sleep. Although she thinks she only drank one small glass of bourbon the night prior to admission, she thinks it's possible that she could have had a few more glasses  UTI: -On IV unasyn which should cover. However no symptoms. - urine cultures pending  . Hypertension  - cautiously beginning Lasix today,c/w  metoprolol and losartan. Follow BP trend and adjust accordingly.   Marland Kitchen Hyperlipidemia  - Continue statins   . Chronic diastolic heart failure  - Clinically compensated, beginning lasix today.   . Atrial fibrillation  - Apparently has had frequent falls over the past few months, maintained on Pradaxa. Ordered PT eval,- may need to stop  Pradaxa if she is going to be discharged home given history of frequent falls and alcohol use, however if she is going to SNF, we can probably continue Pradaxa very cautiously. Awaiting PT eval and conversation with cardiology first   . Depression  - Patient claims that she feels very depressed, all her close friends and family members have died. She claims that she drinks alcohol in order to sleep.  - Psych eval ordered   Frequent falls  - See above-CT head negative. Lives alone, getting PT eval  . Right forearm superficial wounds  - Patient claims that she sustained these superficial cuts/bruises and ulcerations from falling.  -Wound care eval ordered  Disposition: Remain inpatient  DVT Prophylaxis: On Pradaxa for afib   Code Status: Full code   Family Communication Dr Jerral Ralph spoke with nephew on 8/2  Procedures:  None   CONSULTS:  psychiatry and wound care  Time spent 40 minutes-which includes 50% of the time with face-to-face with patient/ family and coordinating care related to the above assessment and plan.   MEDICATIONS: Scheduled Meds: . ampicillin-sulbactam (UNASYN) IV  3 g Intravenous Q8H  . azithromycin  500 mg Intravenous Q24H  . dabigatran  150 mg Oral BID  . folic acid  1 mg Oral Daily  . furosemide  40 mg Oral Daily  . latanoprost  1 drop Both Eyes BID  . losartan  50 mg Oral BID  . metoprolol  50 mg Oral BID  . multivitamin with minerals  1 tablet Oral Daily  . senna  1 tablet Oral BID  . simvastatin  10 mg  Oral q1800  . thiamine  100 mg Oral Daily   Continuous Infusions:  PRN Meds:.acetaminophen, acetaminophen, albuterol, alum & mag hydroxide-simeth, guaiFENesin-dextromethorphan, LORazepam, LORazepam, ondansetron (ZOFRAN) IV, ondansetron, oxyCODONE, traZODone  Antibiotics: Anti-infectives   Start     Dose/Rate Route Frequency Ordered Stop   12/18/13 1800  Ampicillin-Sulbactam (UNASYN) 3 g in sodium chloride 0.9 % 100 mL IVPB     3 g 100  mL/hr over 60 Minutes Intravenous Every 8 hours 12/18/13 1553     12/18/13 1530  azithromycin (ZITHROMAX) 500 mg in dextrose 5 % 250 mL IVPB     500 mg 250 mL/hr over 60 Minutes Intravenous Every 24 hours 12/18/13 1528     12/18/13 1430  azithromycin (ZITHROMAX) 500 mg in dextrose 5 % 250 mL IVPB  Status:  Discontinued     500 mg 250 mL/hr over 60 Minutes Intravenous  Once 12/18/13 1422 12/18/13 1608   12/18/13 1415  cefTRIAXone (ROCEPHIN) 1 g in dextrose 5 % 50 mL IVPB     1 g 100 mL/hr over 30 Minutes Intravenous  Once 12/18/13 1402 12/18/13 1508       PHYSICAL EXAM: Vital signs in last 24 hours: Filed Vitals:   12/18/13 1558 12/18/13 1755 12/18/13 1947 12/19/13 0611  BP: 138/81 136/80 162/92 153/97  Pulse: 68 70 88 97  Temp: 97.5 F (36.4 C)  97.8 F (36.6 C) 98 F (36.7 C)  TempSrc: Oral  Oral Oral  Resp: 16  15 15   Height: 5\' 9"  (1.753 m)     Weight: 64.139 kg (141 lb 6.4 oz)     SpO2: 91%  96% 90%    Weight change:  Filed Weights   12/18/13 1129 12/18/13 1558  Weight: 68.04 kg (150 lb) 64.139 kg (141 lb 6.4 oz)   Body mass index is 20.87 kg/(m^2).   Gen Exam: Awake and alert with clear speech.   Neck: Supple, No JVD.   Chest: B/L Clear.   CVS: S1 S2 Regular, no murmurs.  Abdomen: soft, BS +, non tender, non distended.  Extremities: mild bilateral edema, lower extremities warm to touch. Neurologic: Non Focal.  Skin: No Rash.  Wounds: Couple of superficial right forearm/elbow wounds, covered with gel bandage.  LAB RESULTS: CBC  Recent Labs Lab 12/12/13 1059 12/18/13 1145 12/19/13 0627  WBC 5.9 5.2 9.2  HGB 12.5 12.5 13.0  HCT 37.3 39.3 40.8  PLT 228 217 235  MCV 95.2 99.7 100.7*  MCH 31.9 31.7 32.1  MCHC 33.5 31.8 31.9  RDW 15.5 14.6 15.0  LYMPHSABS  --  1.9  --   MONOABS  --  0.4  --   EOSABS  --  0.1  --   BASOSABS  --  0.0  --     Chemistries   Recent Labs Lab 12/12/13 1032 12/18/13 1145 12/18/13 1440 12/19/13 0627  NA 138 142  --   145  K 4.2 2.7*  --  5.3  CL 97 99  --  107  CO2 30 26  --  25  GLUCOSE 124* 101*  --  133*  BUN 16 13  --  14  CREATININE 1.14* 1.09  --  0.95  CALCIUM 9.3 8.9  --  9.0  MG  --   --  1.6  --    MICROBIOLOGY: Recent Results (from the past 240 hour(s))  CULTURE, BLOOD (ROUTINE X 2)     Status: None   Collection Time    12/18/13  2:25 PM  Result Value Ref Range Status   Specimen Description BLOOD RIGHT HAND   Final   Special Requests BOTTLES DRAWN AEROBIC AND ANAEROBIC 10CC   Final   Culture  Setup Time     Final   Value: 12/18/2013 20:46     Performed at [facility]   Culture     Final   Value:        BLOOD CULTURE RECEIVED NO GROWTH TO DATE CULTURE WILL BE HELD FOR 5 DAYS BEFORE ISSUING A FINAL NEGATIVE REPORT     Performed at [facility]   Report Status PENDING   Incomplete  CULTURE, BLOOD (ROUTINE X 2)     Status: None   Collection Time    12/18/13  2:40 PM      Result Value Ref Range Status   Specimen Description BLOOD LEFT ARM   Final   Special Requests BOTTLES DRAWN AEROBIC AND ANAEROBIC 10CC   Final   Culture  Setup Time     Final   Value: 12/18/2013 20:46     Performed at [facility]   Culture     Final   Value:        BLOOD CULTURE RECEIVED NO GROWTH TO DATE CULTURE WILL BE HELD FOR 5 DAYS BEFORE ISSUING A FINAL NEGATIVE REPORT     Performed at [facility]   Report Status PENDING   Incomplete  CULTURE, BLOOD (ROUTINE X 2)     Status: None   Collection Time    12/18/13  4:20 PM      Result Value Ref Range Status   Specimen Description BLOOD LEFT FOREARM   Final   Special Requests BOTTLES DRAWN AEROBIC AND ANAEROBIC 10CC   Final   Culture  Setup Time     Final   Value: 12/18/2013 20:46     Performed at [facility]   Culture     Final   Value:        BLOOD CULTURE RECEIVED NO GROWTH TO DATE CULTURE WILL BE HELD FOR 5 DAYS BEFORE ISSUING A FINAL NEGATIVE REPORT     Performed at [facility]   Report Status  PENDING   Incomplete  CULTURE, BLOOD (ROUTINE X 2)     Status: None   Collection Time    12/18/13  4:25 PM      Result Value Ref Range Status   Specimen Description BLOOD LEFT WRIST   Final   Special Requests BOTTLES DRAWN AEROBIC ONLY 10CC   Final   Culture  Setup Time     Final   Value: 12/18/2013 20:46     Performed at [facility]   Culture     Final   Value:        BLOOD CULTURE RECEIVED NO GROWTH TO DATE CULTURE WILL BE HELD FOR 5 DAYS BEFORE ISSUING A FINAL NEGATIVE REPORT     Performed at [facility]   Report Status PENDING   Incomplete    RADIOLOGY STUDIES/RESULTS: Dg Chest 1 View  12/18/2013   CLINICAL DATA:  Failure to thrive.  AFib.  EXAM: CHEST - 1 VIEW  COMPARISON:  04/22/2013  FINDINGS: Moderate cardiomegaly. There is opacity at the medial right apex worrisome for airspace disease. Blunting of the left costophrenic angle is present worrisome for a small left pleural effusion. Pulmonary vascularity is within normal limits. Atelectasis at the right base.  IMPRESSION: Right apical pulmonary opacity. Follow-up studies until resolution are  recommended.  Small left pleural effusion  Cardiomegaly.   Electronically Signed   By: Maryclare Bean M.D.   On: 12/18/2013 13:06   Ct Head Wo Contrast  12/18/2013   CLINICAL DATA:  Altered mental status, failure to thrive  EXAM: CT HEAD WITHOUT CONTRAST  TECHNIQUE: Contiguous axial images were obtained from the base of the skull through the vertex without intravenous contrast.  COMPARISON:  None.  FINDINGS: Mild cortical volume loss noted with proportional ventricular prominence. Areas of periventricular white matter hypodensity are most compatible with small vessel ischemic change. No acute hemorrhage, infarct, or mass lesion is identified. Right maxillary opacification is noted with mild ethmoid mucoperiosteal thickening. No skull fracture.  IMPRESSION: No acute intracranial finding.  Chronic findings as above.  Right maxillary  sinusitis.   Electronically Signed   By: Christiana Pellant M.D.   On: 12/18/2013 12:52    [employer]  [facility] Pager:[phone]  If 7PM-7AM, please contact night-coverage [email or web address] Password [email or web address] 12/19/2013, 8:26 AM   LOS: 1 day   **Disclaimer: This note may have been dictated with voice recognition software. Similar sounding words can inadvertently be transcribed and this note may contain transcription errors which may not have been corrected upon publication of note.**  Attending Patient was seen, examined,treatment plan was discussed with the Physician extender. I have directly reviewed the clinical findings, lab, imaging studies and management of this patient in detail. I have made the necessary changes to the above noted documentation, and agree with the documentation, as recorded by the Physician extender.  Windell Norfolk MD [facility].

## 2013-12-19 NOTE — Evaluation (Signed)
Clinical/Bedside Swallow Evaluation Patient Details  Name: NEETI KNUDTSON MRN: [id number] Date of Birth: Oct 30, 1927  Today's Date: 12/19/2013 Time: 1191-4782 SLP Time Calculation (min): 24 min  Past Medical History:  Past Medical History  Diagnosis Date  . Glaucoma   . Hypertension   . Dysrhythmia   . Atrial fibrillation   . CHF (congestive heart failure)     Diastolic dysfunction   Past Surgical History:  Past Surgical History  Procedure Laterality Date  . Abdominal hysterectomy     HPI:  80 yo female adm to [facility] with AMS = found to have pna.  Pt PMH + for ETOH use, heart failure.  She was diagnosed with right lower lobe pna, MD ordered swallow evaluation to assess for aspiration risk.  Pt lives alone but has a nephew that checks on her frequently.  She denies dysphagia.     Assessment / Plan / Recommendation Clinical Impression  Pt presents with functional oropharyngeal swallow ability assessed via bedside.  No cranial nerve deficits noted and pt denies neurological hx.  Pt without clinical indications of airway compromise with po observed *just finishing breakfast.    Swallow was timely with clear voice throughout.  She does admit to large pill dysphagia - stating she nearly choked on a pill previously (pointing to pharynx).  SLP advised pt to alternative ways to consume pills to ease clearance, eg: take with food - follow with drink.  Further advised her to possible pill lodging in distal esophagus with referrant sensation to pharynx - but important factor is clearance.  Recommend continue dys3/thin diet due to pt not having upper dentures in house.  Using teach back, all education was completed.  SlP does not suspect aspiration during eating/drinking as source of pt's pna.  Thanks for this referral.      Aspiration Risk  Mild    Diet Recommendation Dysphagia 3 (Mechanical Soft);Thin liquid   Liquid Administration via: Cup;Straw Medication Administration: Whole meds with liquid  (take with food as needed, warm liquids etc) Supervision: Patient able to self feed Compensations: Slow rate;Small sips/bites Postural Changes and/or Swallow Maneuvers: Seated upright 90 degrees;Upright 30-60 min after meal    Other  Recommendations   n/a  Follow Up Recommendations  None    Frequency and Duration   n/a      Pertinent Vitals/Pain Afebrile, decreased     Swallow Study Prior Functional Status  Type of Home: House Available Help at Discharge: Family;Available PRN/intermittently    General Date of Onset: 12/19/13 HPI: 80 yo female adm to [facility] with AMS = found to have pna.  Pt PMH + for ETOH use, heart failure.  She was diagnosed with right lower lobe pna, MD ordered swallow evaluation to assess for aspiration risk.  Pt lives alone but has a nephew that checks on her frequently.  She denies dysphagia.   Type of Study: Bedside swallow evaluation Diet Prior to this Study: Dysphagia 3 (soft);Thin liquids Temperature Spikes Noted: No Respiratory Status: Nasal cannula History of Recent Intubation: No Behavior/Cognition: Alert;Cooperative;Pleasant mood Oral Cavity - Dentition: Adequate natural dentition (upper denture at home and pt normally wears them to eat) Self-Feeding Abilities: Able to feed self Patient Positioning: Upright in bed Baseline Vocal Quality: Clear Volitional Cough: Strong Volitional Swallow: Able to elicit    Oral/Motor/Sensory Function Overall Oral Motor/Sensory Function: Appears within functional limits for tasks assessed   Ice Chips Ice chips: Not tested   Thin Liquid Thin Liquid: Within functional limits Presentation: Cup;Self Fed  Nectar Thick Nectar Thick Liquid: Not tested   Honey Thick Honey Thick Liquid: Not tested   Puree Puree: Not tested   Solid   GO    Solid: Within functional limits Presentation: [address]elf Fed       Mickie BailKimball, Octavious Zidek Ann Torin Modica [place], [place] [facility] SLP [phone]

## 2013-12-20 DIAGNOSIS — J690 Pneumonitis due to inhalation of food and vomit: Principal | ICD-10-CM

## 2013-12-20 LAB — URINE CULTURE: Colony Count: >=100000

## 2013-12-20 LAB — BASIC METABOLIC PANEL
ANION GAP: 14 (ref 5–15)
BUN: 13 mg/dL (ref 6–23)
CO2: 26 meq/L (ref 19–32)
Calcium: 9 mg/dL (ref 8.4–10.5)
Chloride: 99 mEq/L (ref 96–112)
Creatinine, Ser: 0.95 mg/dL (ref 0.50–1.10)
GFR calc Af Amer: 62 mL/min — ABNORMAL LOW (ref >90)
GFR calc non Af Amer: 54 mL/min — ABNORMAL LOW (ref >90)
GLUCOSE: 120 mg/dL — AB (ref 70–99)
Potassium: 4.5 mEq/L (ref 3.7–5.3)
Sodium: 139 mEq/L (ref 137–147)

## 2013-12-20 MED ORDER — AMOXICILLIN-POT CLAVULANATE 875-125 MG PO TABS
1.0000 | ORAL_TABLET | Freq: Two times a day (BID) | ORAL | Status: DC
  Administered 2013-12-20: 1 via ORAL
  Filled 2013-12-20 (×2): qty 1

## 2013-12-20 MED ORDER — TRAZODONE HCL 100 MG PO TABS: 100.0000 mg | ORAL_TABLET | Freq: Every evening | ORAL | Status: DC | PRN

## 2013-12-20 MED ORDER — AMOXICILLIN-POT CLAVULANATE 875-125 MG PO TABS: 1.0000 | ORAL_TABLET | Freq: Two times a day (BID) | ORAL | Status: DC

## 2013-12-20 NOTE — Discharge Summary (Addendum)
PATIENT DETAILS Name: Sydney Hensley Age: 80 y.o. Sex: female Date of Birth: 24-Jul-1927 MRN: [id number]. Admit Date: 12/18/2013 Admitting Physician: Maretta Bees, MD JYN:[id number],ZHYQ A, MD  Recommendations for Outpatient Follow-up:  1. Discharge to home with home health visiting 2. Started onTrazodone at bedtime for insomnia 3. Please repeat CXR in 6-8 weeks to document resolution of PNA 4. Continue with Pradaxa for afib-consider stopping in the future if continues to have recurrent falls or is found to use ETOH again. 5. Please follow Urine culture till final 6. Please refer to Psych as outpatient  PRIMARY DISCHARGE DIAGNOSIS:  Active Problems:   Congestive heart failure   Atrial fibrillation   Hypertension   Hyperlipidemia   PNA (pneumonia)   Hypokalemia   Frequent falls   Depression   Alcohol abuse      PAST MEDICAL HISTORY: Past Medical History  Diagnosis Date  . Glaucoma   . Hypertension   . Dysrhythmia   . Atrial fibrillation   . CHF (congestive heart failure)     Diastolic dysfunction    DISCHARGE MEDICATIONS:   Medication List         amoxicillin-clavulanate 875-125 MG per tablet  Commonly known as:  AUGMENTIN  Take 1 tablet by mouth every 12 (twelve) hours.     dabigatran 150 MG Caps capsule  Commonly known as:  PRADAXA  Take 1 capsule (150 mg total) by mouth 2 (two) times daily.     furosemide 40 MG tablet  Commonly known as:  LASIX  Take 1.5 tablets (60 mg total) by mouth daily.     latanoprost 0.005 % ophthalmic solution  Commonly known as:  XALATAN  Place 1 drop into both eyes 2 (two) times daily.     losartan 50 MG tablet  Commonly known as:  COZAAR  Take 1 tablet (50 mg total) by mouth 2 (two) times daily.     metoprolol 50 MG tablet  Commonly known as:  LOPRESSOR  Take 50 mg by mouth 2 (two) times daily.     potassium chloride 10 MEQ tablet  Commonly known as:  K-DUR,KLOR-CON  Take 30 mEq by mouth daily.     pravastatin  20 MG tablet  Commonly known as:  PRAVACHOL  Take 20 mg by mouth at bedtime.     traZODone 100 MG tablet  Commonly known as:  DESYREL  Take 1 tablet (100 mg total) by mouth at bedtime as needed for sleep.        ALLERGIES:   Allergies  Allergen Reactions  . Lisinopril Other (See Comments)    Cough    BRIEF HPI:  See H&P, Labs, Consult and Test reports for all details in brief. Patient with past medical history of chronic diastolic heart failure, atrial fibrillation on Pradaxa, hypertension, dyslipidemia presented to ED after being found intoxicated by family. Patient has recently been depressed and been suffering from insomnia. In order to help her sleep, she has been drinking a glass of "bourbon" every night. Upon further evaluation, she was found to have had increased episodes of falling at home and right upper lobe infiltrate on cxr, suspicious of aspiration pneumonia.     CONSULTATIONS:   psychiatry  PERTINENT RADIOLOGIC STUDIES: Dg Chest 1 View  12/18/2013   CLINICAL DATA:  Failure to thrive.  AFib.  EXAM: CHEST - 1 VIEW  COMPARISON:  04/22/2013  FINDINGS: Moderate cardiomegaly. There is opacity at the medial right apex worrisome for airspace disease. Blunting of the  left costophrenic angle is present worrisome for a small left pleural effusion. Pulmonary vascularity is within normal limits. Atelectasis at the right base.  IMPRESSION: Right apical pulmonary opacity. Follow-up studies until resolution are recommended.  Small left pleural effusion  Cardiomegaly.   Electronically Signed   By: Maryclare Bean M.D.   On: 12/18/2013 13:06   Ct Head Wo Contrast  12/18/2013   CLINICAL DATA:  Altered mental status, failure to thrive  EXAM: CT HEAD WITHOUT CONTRAST  TECHNIQUE: Contiguous axial images were obtained from the base of the skull through the vertex without intravenous contrast.  COMPARISON:  None.  FINDINGS: Mild cortical volume loss noted with proportional ventricular prominence. Areas  of periventricular white matter hypodensity are most compatible with small vessel ischemic change. No acute hemorrhage, infarct, or mass lesion is identified. Right maxillary opacification is noted with mild ethmoid mucoperiosteal thickening. No skull fracture.  IMPRESSION: No acute intracranial finding.  Chronic findings as above.  Right maxillary sinusitis.   Electronically Signed   By: Christiana Pellant M.D.   On: 12/18/2013 12:52     PERTINENT LAB RESULTS: CMET CMP     Component Value Date/Time   NA 139 12/20/2013 0824   K 4.5 12/20/2013 0824   CL 99 12/20/2013 0824   CO2 26 12/20/2013 0824   GLUCOSE 120* 12/20/2013 0824   BUN 13 12/20/2013 0824   CREATININE 0.95 12/20/2013 0824   CREATININE 1.14* 12/12/2013 1032   CALCIUM 9.0 12/20/2013 0824   GFRNONAA 54* 12/20/2013 0824   GFRAA 62* 12/20/2013 0824   Microbiology: Recent Results (from the past 240 hour(s))  URINE CULTURE     Status: None   Collection Time    12/18/13 12:40 PM      Result Value Ref Range Status   Specimen Description URINE, RANDOM   Final   Special Requests NONE   Final   Culture  Setup Time     Final   Value: 12/18/2013 13:17     Performed at [facility] Count     Final   Value: >=100,000 COLONIES/ML     Performed at [facility]   Culture     Final   Value: GRAM NEGATIVE RODS     Performed at [facility]   Report Status PENDING   Incomplete  CULTURE, BLOOD (ROUTINE X 2)     Status: None   Collection Time    12/18/13  2:25 PM      Result Value Ref Range Status   Specimen Description BLOOD RIGHT HAND   Final   Special Requests BOTTLES DRAWN AEROBIC AND ANAEROBIC 10CC   Final   Culture  Setup Time     Final   Value: 12/18/2013 20:46     Performed at [facility]   Culture     Final   Value:        BLOOD CULTURE RECEIVED NO GROWTH TO DATE CULTURE WILL BE HELD FOR 5 DAYS BEFORE ISSUING A FINAL NEGATIVE REPORT     Performed at [facility]   Report Status PENDING    Incomplete  CULTURE, BLOOD (ROUTINE X 2)     Status: None   Collection Time    12/18/13  2:40 PM      Result Value Ref Range Status   Specimen Description BLOOD LEFT ARM   Final   Special Requests BOTTLES DRAWN AEROBIC AND ANAEROBIC 10CC   Final   Culture  Setup  Time     Final   Value: 12/18/2013 20:46     Performed at [facility]   Culture     Final   Value:        BLOOD CULTURE RECEIVED NO GROWTH TO DATE CULTURE WILL BE HELD FOR 5 DAYS BEFORE ISSUING A FINAL NEGATIVE REPORT     Performed at [facility]   Report Status PENDING   Incomplete  CULTURE, BLOOD (ROUTINE X 2)     Status: None   Collection Time    12/18/13  4:20 PM      Result Value Ref Range Status   Specimen Description BLOOD LEFT FOREARM   Final   Special Requests BOTTLES DRAWN AEROBIC AND ANAEROBIC 10CC   Final   Culture  Setup Time     Final   Value: 12/18/2013 20:46     Performed at [facility]   Culture     Final   Value:        BLOOD CULTURE RECEIVED NO GROWTH TO DATE CULTURE WILL BE HELD FOR 5 DAYS BEFORE ISSUING A FINAL NEGATIVE REPORT     Performed at [facility]   Report Status PENDING   Incomplete  CULTURE, BLOOD (ROUTINE X 2)     Status: None   Collection Time    12/18/13  4:25 PM      Result Value Ref Range Status   Specimen Description BLOOD LEFT WRIST   Final   Special Requests BOTTLES DRAWN AEROBIC ONLY 10CC   Final   Culture  Setup Time     Final   Value: 12/18/2013 20:46     Performed at [facility]   Culture     Final   Value:        BLOOD CULTURE RECEIVED NO GROWTH TO DATE CULTURE WILL BE HELD FOR 5 DAYS BEFORE ISSUING A FINAL NEGATIVE REPORT     Performed at [facility]   Report Status PENDING   Incomplete     BRIEF HOSPITAL COURSE:   . PNA (pneumonia)  - Suspect aspiration pneumonia given alcohol intoxication.  - Started IV Unasyn, and Zithromax for atypical coverage-day 3. Seen by SLP- remained on Dys 3 diet. Clinically improved,  without any leukocytosis or fever.  -Blood culture on 8/2 neg .Urine S.Pneumo antigen negative Urine Legionella antigen negative as well. Will transition to Augmentin on discharge. Will need a CXR in 6-8 weeks to document resolution of PNA  . Insomnia  -Reports she drinks alcohol to sleep at night  -Did well with trazodone during hospital course -Psychiatry eval on 8/3; recommend Trazodone daily for insomnia  . Hypokalemia  - Multifactorial, likely secondary to Lasix and alcohol use.  - Corrected with supplementation.   . Alcohol abuse  - After discussion with patient, it seems that this patient has been using alcohol primarily to sleep. Although she thinks she only drank one small glass of bourbon the night prior to admission, she thinks it's possible that she could have had a few more glasses - No signs of alcohol withdrawal during her inpatient stay. Was  Ativan used per [facility] protocol.  UTI:  - no symptoms; IV unasyn for tx - urine cultures showed gram - rods, will be on empiric Augmentin-which should cover. Suspect PNA to be primary source of infection, and not UTI.  Marland Kitchen Hypertension  - continue Lasix ,metoprolol and losartan.   Marland Kitchen Hyperlipidemia  - Continue statins   . Chronic  diastolic heart failure  - Stable - continue lasix outpatient  . Atrial fibrillation  - Apparently has had frequent falls over the past few months, maintained on Pradaxa. Spoke with Dr Antoine Poche, and then with patient and family(nephew over the phone)-at this time patient wants to continue to Pradaxa. She understands the risk of catastrophic fall resulting in significant disability/death-especially if she starts using Alcohol again. She accepts all risks. Will need close monitoring in the future, if continues to have falls or found to be using alcohol, may need to stop Pradaxa.  . Depression  - Patient claims that she feels very depressed, all her close friends and family members have died. She claims that  she drinks alcohol in order to sleep.  - Psych eval ordered; recommended Trazodone for sleep and follow up with Psych.   Frequent falls  - See above-CT head negative. Lives alone; uses cane and walker for activity -seen by PT-recommendations are for HHPT, hopefully these will decrease with no further ETOH intake. Will have home health RN/SW/PT on discharge. Patient does not want to go to SNF, per psych eval she has capacity to make her own decisions.   . Right forearm superficial wounds  - Patient claims that she sustained these superficial cuts/bruises and ulcerations from falling.  -Wound care evaluation done while inpatient-Dressing procedure/placement/frequency: periwound cleaned and foam dressing applied.Will have HHRN for close monitoring   TODAY-DAY OF DISCHARGE:  Subjective:   Sydney Hensley today has no headache, no chest abdominal pain,no new weakness tingling or numbness, feels much better wants to go home today.  Objective:   Blood pressure 131/89, pulse 83, temperature 97.8 F (36.6 C), temperature source Oral, resp. rate 18, height 5\' 9"  (1.753 m), weight 64.139 kg (141 lb 6.4 oz), SpO2 91.00%.  Intake/Output Summary (Last 24 hours) at 12/20/13 1020 Last data filed at 12/20/13 0000  Gross per 24 hour  Intake    494 ml  Output      0 ml  Net    494 ml   Exam Awake Alert, Oriented *3, No new F.N deficits, Normal affect [place].AT,PERRAL Supple Neck,No JVD, No cervical lymphadenopathy appreciated.  Symmetrical Chest wall movement, Good air movement bilaterally, CTAB RRR,No Gallops,Rubs or new Murmurs, No Parasternal Heave + B.Sounds, Abd Soft, Non tender, No organomegaly appreciated, No rebound -guarding or rigidity. No Cyanosis, Clubbing or edema, No new Rash or bruise  DISCHARGE CONDITION: Stable  DISPOSITION: Home with home health services  DISCHARGE INSTRUCTIONS:    Activity:  As tolerated with walker/cane; increase activity slowly   Diet  recommendation: Heart Healthy diet   Discharge Instructions   Call MD for:  persistant nausea and vomiting    Complete by:  As directed      Call MD for:  temperature >100.4    Complete by:  As directed      Diet - low sodium heart healthy    Complete by:  As directed      Increase activity slowly    Complete by:  As directed      Walker     Complete by:  As directed            Follow-up Information   Follow up with PERINI,MARK A, MD In 1 week.   Specialty:  Internal Medicine   Contact information:   [address] [place] [address] [phone]       Follow up with Rollene Rotunda, MD. Schedule an appointment as soon as possible for a  visit in 1 week.   Specialty:  Cardiology   Contact information:   [address] [place] [address] [phone]       Total Time spent on discharge equals 45 minutes.  Signed: Linton RumpStorm, Joshua O, PA-S, [employer]  12/20/2013 10:20 AM  **Disclaimer: This note may have been dictated with voice recognition software. Similar sounding words can inadvertently be transcribed and this note may contain transcription errors which may not have been corrected upon publication of note.**  Attending Patient was seen, examined,treatment plan was discussed with the Physician extender. I have directly reviewed the clinical findings, lab, imaging studies and management of this patient in detail. I have made the necessary changes to the above noted documentation, and agree with the documentation, as recorded by the Physician extender.  Windell NorfolkS Ghimire MD [facility].

## 2013-12-20 NOTE — Progress Notes (Signed)
NURSING PROGRESS NOTE  [place] Jans[place] A Kube [id number] Discharge Data: 12/20/2013 12:46 PM Attending Provider: Maretta BeesShanker M Ghimire, MD WUJ:[id number],NWGNPCP:PERINI,MARK A, MD     [place] Janskyharon A Bochicchio to be D/C'd Home  per MD order.  Discussed with the patient the After Visit Summary and all questions fully answered. All IV's discontinued with no bleeding noted. All belongings returned to patient for patient to take home.   Last Vital Signs:  Blood pressure 131/89, pulse 83, temperature 97.8 F (36.6 C), temperature source Oral, resp. rate 18, height 5\' 9"  (1.753 m), weight 64.139 kg (141 lb 6.4 oz), SpO2 91.00%.  Discharge Medication List   Medication List         amoxicillin-clavulanate 875-125 MG per tablet  Commonly known as:  AUGMENTIN  Take 1 tablet by mouth every 12 (twelve) hours.     dabigatran 150 MG Caps capsule  Commonly known as:  PRADAXA  Take 1 capsule (150 mg total) by mouth 2 (two) times daily.     furosemide 40 MG tablet  Commonly known as:  LASIX  Take 1.5 tablets (60 mg total) by mouth daily.     latanoprost 0.005 % ophthalmic solution  Commonly known as:  XALATAN  Place 1 drop into both eyes 2 (two) times daily.     losartan 50 MG tablet  Commonly known as:  COZAAR  Take 1 tablet (50 mg total) by mouth 2 (two) times daily.     metoprolol 50 MG tablet  Commonly known as:  LOPRESSOR  Take 50 mg by mouth 2 (two) times daily.     potassium chloride 10 MEQ tablet  Commonly known as:  K-DUR,KLOR-CON  Take 30 mEq by mouth daily.     pravastatin 20 MG tablet  Commonly known as:  PRAVACHOL  Take 20 mg by mouth at bedtime.     traZODone 100 MG tablet  Commonly known as:  DESYREL  Take 1 tablet (100 mg total) by mouth at bedtime as needed for sleep.         Cathlyn Parsonsattha Torrion Witter, RN

## 2013-12-20 NOTE — Progress Notes (Signed)
Physical Therapy Treatment Patient Details Name: Sydney Hensley MRN: [id number] DOB: [birth year] Today's Date: 12/20/2013    History of Present Illness Sydney Hensley is a 80 y.o. female with a Past Medical History of chronic diastolic heart failure, atrial fibrillation on Pradaxa, hypertension, dyslipidemia who presents today with the above noted complaint. Please note, no family members at bedside, this M.D. tried calling niece and nephew listed as next of kin-unsuccessful in contacting them. Most of this history is obtained from the patient, and from the ED chart. Per patient, she lives alone and has no children of her own. Her nephew lives nearby and checks on her on a daily basis it she claims she has no other immediate family members, and just wants to "die". She claims that she has trouble with insomnia for the past numerous months, and drinks a glass of "bourbon" every night to help her sleep. She claims that she has been doing this for many months. She apparently was found by her nephew earlier today on a chair, very weak, crying and sobbing.     PT Comments    Patient's balance is much improved with use of RW for support. Education provided to use RW at all times at home to prevent falls. Pt agreeable. Recommend frequent supervision from nephew at discharge to maintain safety. Will benefit from continued follow up HHPT for safety evaluation and to maximize independence/improve safety. Will follow and progress as tolerated in acute setting.   Follow Up Recommendations  Home health PT;Supervision for mobility/OOB     Equipment Recommendations  None recommended by PT    Recommendations for Other Services       Precautions / Restrictions Precautions Precautions: Fall Precaution Comments: 3 falls in last 6 months Restrictions Weight Bearing Restrictions: No    Mobility  Bed Mobility Overal bed mobility: Needs Assistance Bed Mobility: Supine to Sit     Supine to sit: Min  guard     General bed mobility comments: Use of rail for assist.   Transfers Overall transfer level: Needs assistance Equipment used: Rolling walker (2 wheeled) Transfers: Sit to/from Stand Sit to Stand: Min guard         General transfer comment: Stood x1 from EOB, x1 from recliner. Min guard for safety upon standing.   Ambulation/Gait Ambulation/Gait assistance: Min assist;Min guard Ambulation Distance (Feet): 150 Feet (+150' without AD.) Assistive device: Rolling walker (2 wheeled);None Gait Pattern/deviations: Step-through pattern;Decreased stride length;Trunk flexed;Staggering left;Scissoring;Narrow base of support Gait velocity: 1.7 ft/sec Gait velocity interpretation: <1.8 ft/sec, indicative of risk for recurrent falls General Gait Details: Balance worsened without use of RW for support - staggering, narrow BoS and scrissoring noted on a few occasions. Min A required to prevent fall. Balance, cadence and safety improved with use of RW. Use of hand rail occasionally during ambulation without AD for support.    Stairs            Wheelchair Mobility    Modified Rankin (Stroke Patients Only)       Balance Overall balance assessment: Needs assistance   Sitting balance-Leahy Scale: Good       Standing balance-Leahy Scale: Fair Standing balance comment: Able to perform grooming at sink without support of RW for 1-2 minutes.                     Cognition Arousal/Alertness: Awake/alert Behavior During Therapy: WFL for tasks assessed/performed Overall Cognitive Status: Within Functional Limits for tasks assessed  Exercises General Exercises - Lower Extremity Ankle Circles/Pumps: Both;10 reps;Seated Long Arc Quad: Both;15 reps;Seated Hip Flexion/Marching: Both;15 reps;Seated    General Comments        Pertinent Vitals/Pain No pain reported. SOB present post second ambulation bout with HR increasing to 135 bpm (without  use of RW). Resolved within 30 sec of seated rest break. HR only increased to 120 bpm with use of RW for support.     Home Living                      Prior Function            PT Goals (current goals can now be found in the care plan section) Progress towards PT goals: Progressing toward goals    Frequency  Min 3X/week    PT Plan Current plan remains appropriate    Co-evaluation             End of Session Equipment Utilized During Treatment: Gait belt Activity Tolerance: Patient tolerated treatment well Patient left: in chair;with call bell/phone within reach;with chair alarm set     Time: [id number] PT Time Calculation (min): 24 min  Charges:  $Gait Training: 23-37 mins                    G CodesAlvie Heidelberg:      Folan, Krisinda Giovanni A 12/20/2013, 11:33 AM Alvie HeidelbergShauna Folan, PT, DPT [phone]

## 2013-12-23 ENCOUNTER — Telehealth: Payer: Self-pay | Admitting: Cardiology

## 2013-12-23 NOTE — Telephone Encounter (Signed)
New message          Pt was recently in the hospital and the physician wants her to see her cardiologist in one week / there are no open slots for either hochrein or a PA / NP at either location / Can you work pt in

## 2013-12-24 NOTE — Telephone Encounter (Signed)
Looks like we need to work this pt. In ; she's a s/p hospital look at either Dr. Antoine PocheHochrein or an extender

## 2013-12-24 NOTE — Telephone Encounter (Signed)
I will take a look and check with the schedulers here

## 2013-12-26 LAB — CULTURE, BLOOD (ROUTINE X 2)
CULTURE: NO GROWTH
Culture: NO GROWTH
Culture: NO GROWTH
Culture: NO GROWTH

## 2014-01-03 ENCOUNTER — Encounter: Payer: Self-pay | Admitting: Cardiology

## 2014-01-03 ENCOUNTER — Ambulatory Visit (INDEPENDENT_AMBULATORY_CARE_PROVIDER_SITE_OTHER): Payer: Medicare Other | Admitting: Cardiology

## 2014-01-03 VITALS — BP 110/70 | Ht 69.0 in | Wt 135.0 lb

## 2014-01-03 DIAGNOSIS — I4891 Unspecified atrial fibrillation: Secondary | ICD-10-CM

## 2014-01-03 DIAGNOSIS — I480 Paroxysmal atrial fibrillation: Secondary | ICD-10-CM

## 2014-01-03 MED ORDER — METOPROLOL TARTRATE 25 MG PO TABS: 25.0000 mg | ORAL_TABLET | Freq: Two times a day (BID) | ORAL | Status: DC

## 2014-01-03 NOTE — Progress Notes (Signed)
HPI The patient presents for evaluation of atrial fibrillation and diastolic heart failure.  Since I last saw her she was hospitalized. She apparently had some aspiration pneumonia but apparently the predominant issue with altered mental status related to EtOH which she is using to help her sleep. She was also very depressed having lost his and her sister in a short period of time. She says she's no longer drinking.  She is not having, presyncope or syncope. She doesn't have any chest pressure, neck or arm discomfort. She doesn't notice any palpitations. She's not having any shortness of breath. She says she's starting to get over her depression.   Of note I did review her hospital records.  Allergies  Allergen Reactions  . Lisinopril Other (See Comments)    Cough    Current Outpatient Prescriptions  Medication Sig Dispense Refill  . dabigatran (PRADAXA) 150 MG CAPS Take 1 capsule (150 mg total) by mouth 2 (two) times daily.  60 capsule    . furosemide (LASIX) 40 MG tablet Take 1.5 tablets (60 mg total) by mouth daily.  45 tablet  1  . latanoprost (XALATAN) 0.005 % ophthalmic solution Place 1 drop into both eyes 2 (two) times daily.       Marland Kitchen losartan (COZAAR) 50 MG tablet Take 1 tablet (50 mg total) by mouth 2 (two) times daily.  180 tablet  3  . metoprolol (LOPRESSOR) 50 MG tablet Take 50 mg by mouth 2 (two) times daily.      . potassium chloride (K-DUR,KLOR-CON) 10 MEQ tablet Take 30 mEq by mouth daily.      . pravastatin (PRAVACHOL) 20 MG tablet Take 20 mg by mouth at bedtime.      . traZODone (DESYREL) 100 MG tablet Take 1 tablet (100 mg total) by mouth at bedtime as needed for sleep.  30 tablet  0   No current facility-administered medications for this visit.    Past Medical History  Diagnosis Date  . Glaucoma   . Hypertension   . Dysrhythmia   . Atrial fibrillation   . CHF (congestive heart failure)     Diastolic dysfunction    Past Surgical History  Procedure Laterality  Date  . Abdominal hysterectomy      ROS:  As stated in the history of present illness and negative for all other systems.  PHYSICAL EXAM BP 110/70  Ht 5\' 9"  (1.753 m)  Wt 135 lb (61.236 kg)  BMI 19.93 kg/m2 GENERAL:  Frail appearing, no distress HEENT:  Pupils equal round and reactive, fundi not visualized, oral mucosa unremarkable NECK: No JVD, waveform within normal limits, carotid upstroke brisk and symmetric, no bruits, no thyromegaly LUNGS:  Clear to auscultation bilaterally BACK:  No CVA tenderness CHEST:  Unremarkable HEART:  PMI not displaced or sustained,S1 and S2 within normal limits, no S3, no clicks, no rubs, no murmurs, irregular ABD:  Flat, positive bowel sounds normal in frequency in pitch, no bruits, no rebound, no guarding, no midline pulsatile mass, no hepatomegaly, no splenomegaly EXT:  2 plus pulses throughout, no cyanosis no clubbing, compression stockings in place, mild edema  EKG:  Atrial fibrillation, rate 58, left axis deviation, left anterior fascicular block, interventricular conduction delay, lateral T-wave inversions unchanged from previous. 01/03/2014  ASSESSMENT AND PLAN  Atrial fibrillation - The patient  tolerates this rhythm and rate control and anticoagulation.  We had a long discussion about any fall risk for alcohol use and she is not using alcohol and I  believe her fall risk to be minimal. Given his anticoagulation is still indicated.  Noting that her heart rate is low I will reduce her metoprolol however to 25 mg twice daily.  Congestive heart failure - She will remain on the meds as listed.  She seems to be euvolemic.  HTN - The blood pressure is at target. No change in medications is indicated. We will continue with therapeutic lifestyle changes (TLC).

## 2014-01-03 NOTE — Patient Instructions (Signed)
Your physician recommends that you schedule a follow-up appointment in: one year with Dr. Antoine PocheHochrein  We are decreasing your Metoprolol to 25 mg Two times a day

## 2014-02-10 ENCOUNTER — Ambulatory Visit (HOSPITAL_COMMUNITY): Admission: RE | Admit: 2014-02-10 | Payer: Medicare Other | Source: Ambulatory Visit

## 2014-02-10 ENCOUNTER — Other Ambulatory Visit (HOSPITAL_COMMUNITY): Payer: Self-pay | Admitting: Internal Medicine

## 2014-02-27 ENCOUNTER — Other Ambulatory Visit: Payer: Self-pay | Admitting: Internal Medicine

## 2014-02-27 DIAGNOSIS — R410 Disorientation, unspecified: Secondary | ICD-10-CM

## 2014-03-08 ENCOUNTER — Ambulatory Visit
Admission: RE | Admit: 2014-03-08 | Discharge: 2014-03-08 | Disposition: A | Discharge status: Home / Self Care | Payer: Medicare Other | Source: Ambulatory Visit | Attending: Internal Medicine | Admitting: Internal Medicine

## 2014-03-08 DIAGNOSIS — R410 Disorientation, unspecified: Secondary | ICD-10-CM

## 2014-03-15 ENCOUNTER — Other Ambulatory Visit (HOSPITAL_COMMUNITY): Payer: Medicare Other

## 2014-03-20 ENCOUNTER — Other Ambulatory Visit (HOSPITAL_COMMUNITY): Payer: Medicare Other

## 2014-03-24 ENCOUNTER — Other Ambulatory Visit (HOSPITAL_COMMUNITY): Payer: Medicare Other

## 2014-03-24 ENCOUNTER — Other Ambulatory Visit: Payer: Self-pay | Admitting: Cardiology

## 2014-03-24 MED ORDER — POTASSIUM CHLORIDE CRYS ER 10 MEQ PO TBCR: 30.0000 meq | EXTENDED_RELEASE_TABLET | Freq: Every day | ORAL | Status: DC

## 2014-03-27 ENCOUNTER — Other Ambulatory Visit: Payer: Self-pay | Admitting: *Deleted

## 2014-03-27 MED ORDER — LOSARTAN POTASSIUM 50 MG PO TABS
50.0000 mg | ORAL_TABLET | Freq: Two times a day (BID) | ORAL | Status: AC
Start: 2014-03-27 — End: ?

## 2014-07-26 IMAGING — CR DG CHEST 2V
2 series · 2 of 2 positions shown · non-contrast
Comparison: None.

CLINICAL DATA: Shortness of breath

CHEST - 2 VIEW

[w chest pa]
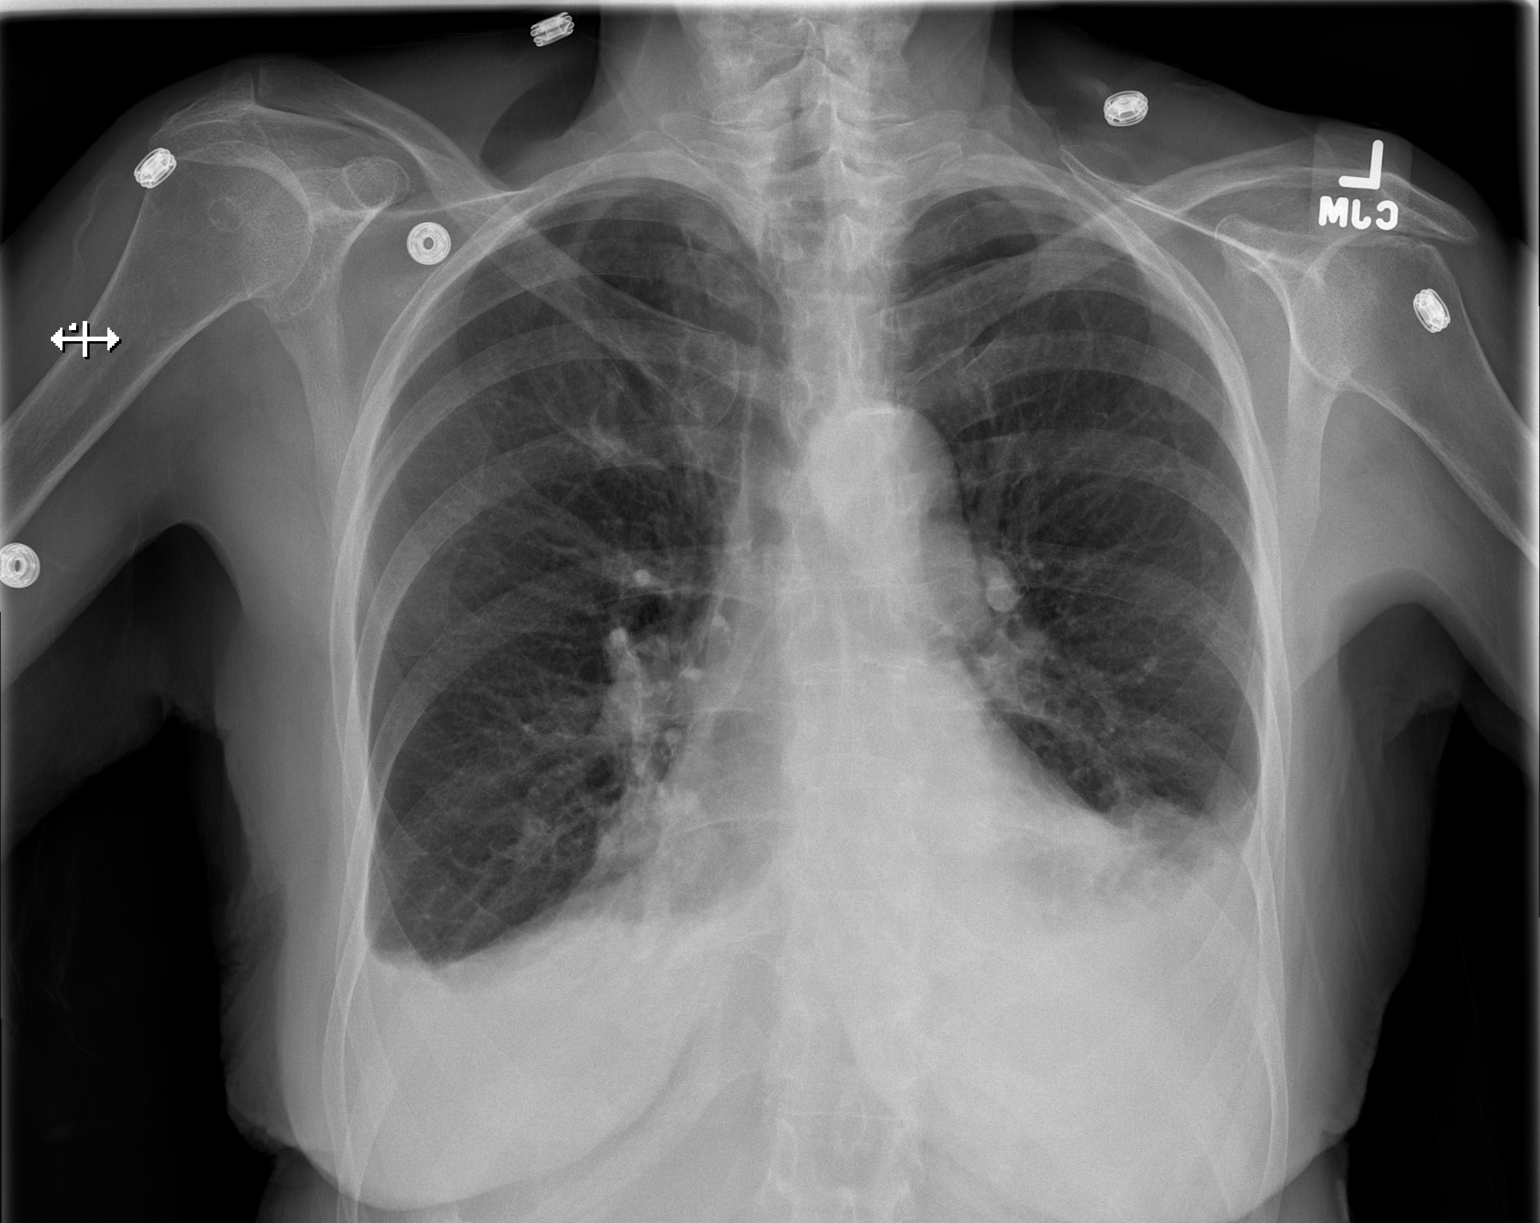

[w chest lat]
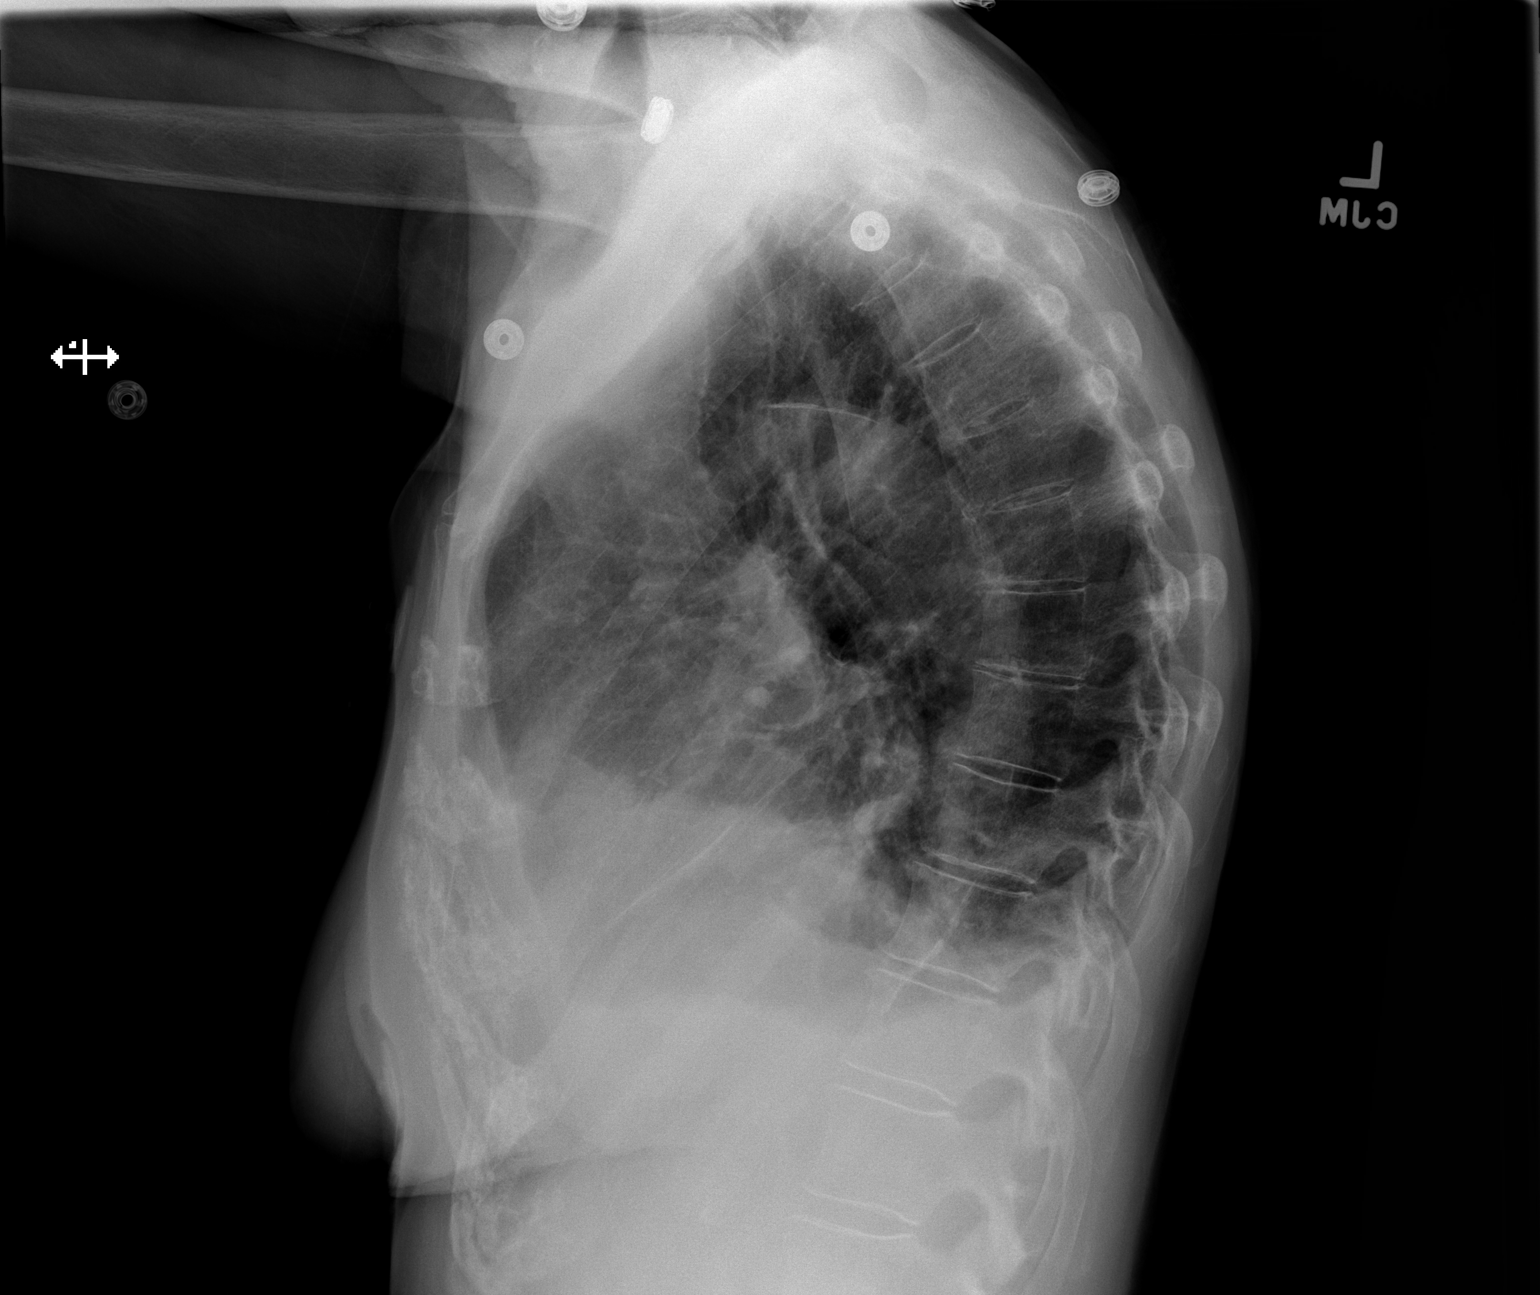

[2 of 2 positions shown; findings below may reference images not displayed]

FINDINGS: There are small bilateral pleural effusions with
consolidation of bilateral lung bases.  There is a small opacity in
the right upper lobe, early developing pneumonia is possible.]The
mediastinal contour is normal.  Evaluation of heart size is limited
due to loss of heart border.  There is minimal degenerative joint
changes of the spine.  The soft tissues are otherwise normal.
IMPRESSION: Small bilateral pleural effusions with consolidation of bilateral
lung bases at least in part due to atelectasis but underlying
pneumonia is not excluded.  There is a small opacity in the right
upper lobe, early developing pneumonia is possible.

## 2014-08-25 ENCOUNTER — Inpatient Hospital Stay (HOSPITAL_COMMUNITY)
Admission: EM | Admit: 2014-08-25 | Discharge: 2014-08-31 | DRG: 640 | Disposition: A | Discharge status: Skilled Nursing Facility | Payer: Medicare Other | Attending: Internal Medicine | Admitting: Internal Medicine

## 2014-08-25 ENCOUNTER — Other Ambulatory Visit (HOSPITAL_COMMUNITY): Payer: Self-pay

## 2014-08-25 ENCOUNTER — Encounter (HOSPITAL_COMMUNITY): Payer: Self-pay | Admitting: *Deleted

## 2014-08-25 DIAGNOSIS — I468 Cardiac arrest due to other underlying condition: Secondary | ICD-10-CM | POA: Diagnosis present

## 2014-08-25 DIAGNOSIS — I10 Essential (primary) hypertension: Secondary | ICD-10-CM | POA: Diagnosis present

## 2014-08-25 DIAGNOSIS — Z79899 Other long term (current) drug therapy: Secondary | ICD-10-CM | POA: Diagnosis not present

## 2014-08-25 DIAGNOSIS — J90 Pleural effusion, not elsewhere classified: Secondary | ICD-10-CM | POA: Diagnosis not present

## 2014-08-25 DIAGNOSIS — I4891 Unspecified atrial fibrillation: Secondary | ICD-10-CM | POA: Diagnosis present

## 2014-08-25 DIAGNOSIS — H409 Unspecified glaucoma: Secondary | ICD-10-CM | POA: Diagnosis present

## 2014-08-25 DIAGNOSIS — D649 Anemia, unspecified: Secondary | ICD-10-CM | POA: Diagnosis present

## 2014-08-25 DIAGNOSIS — I5043 Acute on chronic combined systolic (congestive) and diastolic (congestive) heart failure: Secondary | ICD-10-CM | POA: Diagnosis present

## 2014-08-25 DIAGNOSIS — I509 Heart failure, unspecified: Secondary | ICD-10-CM

## 2014-08-25 DIAGNOSIS — Z7902 Long term (current) use of antithrombotics/antiplatelets: Secondary | ICD-10-CM | POA: Diagnosis not present

## 2014-08-25 DIAGNOSIS — E876 Hypokalemia: Secondary | ICD-10-CM | POA: Diagnosis present

## 2014-08-25 DIAGNOSIS — Z888 Allergy status to other drugs, medicaments and biological substances status: Secondary | ICD-10-CM

## 2014-08-25 DIAGNOSIS — Z9181 History of falling: Secondary | ICD-10-CM | POA: Diagnosis not present

## 2014-08-25 DIAGNOSIS — J810 Acute pulmonary edema: Secondary | ICD-10-CM | POA: Insufficient documentation

## 2014-08-25 DIAGNOSIS — F329 Major depressive disorder, single episode, unspecified: Secondary | ICD-10-CM | POA: Diagnosis present

## 2014-08-25 DIAGNOSIS — I469 Cardiac arrest, cause unspecified: Secondary | ICD-10-CM | POA: Diagnosis present

## 2014-08-25 DIAGNOSIS — Z66 Do not resuscitate: Secondary | ICD-10-CM | POA: Diagnosis present

## 2014-08-25 DIAGNOSIS — I472 Ventricular tachycardia, unspecified: Secondary | ICD-10-CM

## 2014-08-25 DIAGNOSIS — G9340 Encephalopathy, unspecified: Secondary | ICD-10-CM | POA: Diagnosis not present

## 2014-08-25 DIAGNOSIS — H5441 Blindness, right eye, normal vision left eye: Secondary | ICD-10-CM | POA: Diagnosis present

## 2014-08-25 DIAGNOSIS — E785 Hyperlipidemia, unspecified: Secondary | ICD-10-CM | POA: Diagnosis present

## 2014-08-25 DIAGNOSIS — I272 Other secondary pulmonary hypertension: Secondary | ICD-10-CM | POA: Diagnosis present

## 2014-08-25 DIAGNOSIS — F0390 Unspecified dementia without behavioral disturbance: Secondary | ICD-10-CM | POA: Diagnosis present

## 2014-08-25 DIAGNOSIS — E440 Moderate protein-calorie malnutrition: Secondary | ICD-10-CM | POA: Diagnosis present

## 2014-08-25 DIAGNOSIS — R195 Other fecal abnormalities: Secondary | ICD-10-CM | POA: Diagnosis present

## 2014-08-25 DIAGNOSIS — I481 Persistent atrial fibrillation: Secondary | ICD-10-CM | POA: Diagnosis not present

## 2014-08-25 DIAGNOSIS — T502X5A Adverse effect of carbonic-anhydrase inhibitors, benzothiadiazides and other diuretics, initial encounter: Secondary | ICD-10-CM | POA: Diagnosis present

## 2014-08-25 DIAGNOSIS — R410 Disorientation, unspecified: Secondary | ICD-10-CM

## 2014-08-25 DIAGNOSIS — R627 Adult failure to thrive: Secondary | ICD-10-CM | POA: Diagnosis present

## 2014-08-25 DIAGNOSIS — I5022 Chronic systolic (congestive) heart failure: Secondary | ICD-10-CM | POA: Diagnosis not present

## 2014-08-25 DIAGNOSIS — Z87891 Personal history of nicotine dependence: Secondary | ICD-10-CM

## 2014-08-25 DIAGNOSIS — I5021 Acute systolic (congestive) heart failure: Secondary | ICD-10-CM | POA: Diagnosis not present

## 2014-08-25 DIAGNOSIS — I4901 Ventricular fibrillation: Secondary | ICD-10-CM | POA: Diagnosis present

## 2014-08-25 DIAGNOSIS — I482 Chronic atrial fibrillation: Secondary | ICD-10-CM | POA: Diagnosis present

## 2014-08-25 HISTORY — DX: Blindness, one eye, unspecified eye: H54.40

## 2014-08-25 HISTORY — DX: Depression, unspecified: F32.A

## 2014-08-25 HISTORY — DX: Pure hypercholesterolemia, unspecified: E78.00

## 2014-08-25 HISTORY — DX: Pneumonia, unspecified organism: J18.9

## 2014-08-25 HISTORY — DX: Cellulitis, unspecified: L03.90

## 2014-08-25 HISTORY — DX: Unspecified dementia, unspecified severity, without behavioral disturbance, psychotic disturbance, mood disturbance, and anxiety: F03.90

## 2014-08-25 HISTORY — DX: Major depressive disorder, single episode, unspecified: F32.9

## 2014-08-25 HISTORY — DX: Cerebral infarction, unspecified: I63.9

## 2014-08-25 HISTORY — DX: Ventricular fibrillation: I49.01

## 2014-08-25 LAB — I-STAT CHEM 8, ED
BUN: 10 mg/dL (ref 6–23)
Calcium, Ion: 1 mmol/L — ABNORMAL LOW (ref 1.13–1.30)
Chloride: 86 mmol/L — ABNORMAL LOW (ref 96–112)
Creatinine, Ser: 1 mg/dL (ref 0.50–1.10)
GLUCOSE: 116 mg/dL — AB (ref 70–99)
HCT: 41 % (ref 36.0–46.0)
Hemoglobin: 13.9 g/dL (ref 12.0–15.0)
Potassium: 2.6 mmol/L — CL (ref 3.5–5.1)
Sodium: 136 mmol/L (ref 135–145)
TCO2: 33 mmol/L (ref 0–100)

## 2014-08-25 LAB — BASIC METABOLIC PANEL
Anion gap: 17 — ABNORMAL HIGH (ref 5–15)
BUN: 9 mg/dL (ref 6–23)
CALCIUM: 8.3 mg/dL — AB (ref 8.4–10.5)
CHLORIDE: 89 mmol/L — AB (ref 96–112)
CO2: 33 mmol/L — ABNORMAL HIGH (ref 19–32)
CREATININE: 1.09 mg/dL (ref 0.50–1.10)
GFR calc Af Amer: 52 mL/min — ABNORMAL LOW (ref >90)
GFR calc non Af Amer: 45 mL/min — ABNORMAL LOW (ref >90)
Glucose, Bld: 134 mg/dL — ABNORMAL HIGH (ref 70–99)
Potassium: 2.4 mmol/L — CL (ref 3.5–5.1)
Sodium: 139 mmol/L (ref 135–145)

## 2014-08-25 LAB — COMPREHENSIVE METABOLIC PANEL
ALK PHOS: 41 U/L (ref 39–117)
ALT: 13 U/L (ref 0–35)
AST: 24 U/L (ref 0–37)
Albumin: 2.9 g/dL — ABNORMAL LOW (ref 3.5–5.2)
Anion gap: 8 (ref 5–15)
BILIRUBIN TOTAL: 1.2 mg/dL (ref 0.3–1.2)
BUN: 10 mg/dL (ref 6–23)
CHLORIDE: 90 mmol/L — AB (ref 96–112)
CO2: 39 mmol/L — AB (ref 19–32)
Calcium: 8.8 mg/dL (ref 8.4–10.5)
Creatinine, Ser: 1.11 mg/dL — ABNORMAL HIGH (ref 0.50–1.10)
GFR calc Af Amer: 51 mL/min — ABNORMAL LOW (ref >90)
GFR, EST NON AFRICAN AMERICAN: 44 mL/min — AB (ref >90)
Glucose, Bld: 129 mg/dL — ABNORMAL HIGH (ref 70–99)
Potassium: 2.8 mmol/L — ABNORMAL LOW (ref 3.5–5.1)
Sodium: 137 mmol/L (ref 135–145)
Total Protein: 5.6 g/dL — ABNORMAL LOW (ref 6.0–8.3)

## 2014-08-25 LAB — CBC
HCT: 37 % (ref 36.0–46.0)
Hemoglobin: 11.9 g/dL — ABNORMAL LOW (ref 12.0–15.0)
MCH: 30.2 pg (ref 26.0–34.0)
MCHC: 32.2 g/dL (ref 30.0–36.0)
MCV: 93.9 fL (ref 78.0–100.0)
PLATELETS: 191 10*3/uL (ref 150–400)
RBC: 3.94 MIL/uL (ref 3.87–5.11)
RDW: 14.4 % (ref 11.5–15.5)
WBC: 7.8 10*3/uL (ref 4.0–10.5)

## 2014-08-25 LAB — MAGNESIUM: MAGNESIUM: 1.7 mg/dL (ref 1.5–2.5)

## 2014-08-25 LAB — ABO/RH: ABO/RH(D): A POS

## 2014-08-25 LAB — TYPE AND SCREEN
ABO/RH(D): A POS
Antibody Screen: NEGATIVE

## 2014-08-25 LAB — MRSA PCR SCREENING: MRSA by PCR: NEGATIVE

## 2014-08-25 MED ORDER — MAGNESIUM SULFATE 2 GM/50ML IV SOLN
2.0000 g | Freq: Once | INTRAVENOUS | Status: AC
  Administered 2014-08-25: 2 g via INTRAVENOUS
  Filled 2014-08-25: qty 50

## 2014-08-25 MED ORDER — PRAVASTATIN SODIUM 20 MG PO TABS
20.0000 mg | ORAL_TABLET | Freq: Every day | ORAL | Status: DC
  Administered 2014-08-25 – 2014-08-30 (×6): 20 mg via ORAL
  Filled 2014-08-25 (×6): qty 1

## 2014-08-25 MED ORDER — PANTOPRAZOLE SODIUM 40 MG IV SOLR
40.0000 mg | Freq: Once | INTRAVENOUS | Status: AC
  Administered 2014-08-25: 40 mg via INTRAVENOUS
  Filled 2014-08-25: qty 40

## 2014-08-25 MED ORDER — POTASSIUM CHLORIDE 10 MEQ/100ML IV SOLN
10.0000 meq | INTRAVENOUS | Status: AC
  Administered 2014-08-25 – 2014-08-26 (×6): 10 meq via INTRAVENOUS
  Filled 2014-08-25 (×6): qty 100

## 2014-08-25 MED ORDER — POTASSIUM CHLORIDE CRYS ER 20 MEQ PO TBCR
40.0000 meq | EXTENDED_RELEASE_TABLET | Freq: Once | ORAL | Status: AC
  Administered 2014-08-25: 40 meq via ORAL
  Filled 2014-08-25: qty 2

## 2014-08-25 MED ORDER — POTASSIUM CHLORIDE 10 MEQ/100ML IV SOLN
10.0000 meq | Freq: Once | INTRAVENOUS | Status: AC
  Administered 2014-08-25: 10 meq via INTRAVENOUS
  Filled 2014-08-25: qty 100

## 2014-08-25 MED ORDER — DABIGATRAN ETEXILATE MESYLATE 150 MG PO CAPS
150.0000 mg | ORAL_CAPSULE | Freq: Two times a day (BID) | ORAL | Status: DC
  Administered 2014-08-25 – 2014-08-31 (×12): 150 mg via ORAL
  Filled 2014-08-25 (×13): qty 1

## 2014-08-25 MED ORDER — ATROPINE SULFATE 1 MG/ML IJ SOLN
INTRAMUSCULAR | Status: AC | PRN
  Administered 2014-08-25: 1 mg via INTRAVENOUS

## 2014-08-25 MED ORDER — SODIUM CHLORIDE 0.9 % IJ SOLN
3.0000 mL | Freq: Two times a day (BID) | INTRAMUSCULAR | Status: DC
  Administered 2014-08-27 – 2014-08-30 (×9): 3 mL via INTRAVENOUS

## 2014-08-25 MED ORDER — POTASSIUM CHLORIDE IN NACL 20-0.9 MEQ/L-% IV SOLN
INTRAVENOUS | Status: AC
  Administered 2014-08-25: 20:00:00 via INTRAVENOUS
  Filled 2014-08-25 (×2): qty 1000

## 2014-08-25 MED ORDER — LATANOPROST 0.005 % OP SOLN
1.0000 [drp] | Freq: Every day | OPHTHALMIC | Status: DC
  Administered 2014-08-25 – 2014-08-30 (×6): 1 [drp] via OPHTHALMIC
  Filled 2014-08-25: qty 2.5

## 2014-08-25 MED FILL — Medication: Qty: 1 | Status: AC

## 2014-08-25 NOTE — ED Notes (Signed)
Pt noted to be in vfib; was unresponsive; CPR initiated; pt placed on zoll and defib x 1 with return of pulses and mentation; pt then became brady and became unresponsive again; atropine given and pt paced at rate of 64bpm with capture; pt tolerating pacing and is alert at present

## 2014-08-25 NOTE — ED Notes (Signed)
hospitalist at bedside

## 2014-08-25 NOTE — ED Notes (Signed)
Family at bedside. 

## 2014-08-25 NOTE — ED Notes (Signed)
Velna HatchetSheila in main lab states she will add on magnesium.

## 2014-08-25 NOTE — Progress Notes (Signed)
CRITICAL VALUE ALERT  Critical value received:  K+ 2.4  Date of notification:  08/25/14  Time of notification:  2055  Critical value read back:Yes.    Nurse who received alert:  Merry Proudeborah Westley Blass, RN  MD notified (1st page):  Elray McgregorMary Lynch, NP  Time of first page:  2059  MD notified (2nd page):  Time of second page:  Responding MD:  Elray McgregorMary Lynch, NP  Time MD responded:  2105

## 2014-08-25 NOTE — ED Provider Notes (Signed)
CSN: [id number]     Arrival date & time 08/25/14  1337 History   First MD Initiated Contact with Patient 08/25/14 1503     Chief Complaint  Patient presents with  . Abnormal Lab      HPI  Patient presents for evaluation of low potassium, and low hemoglobin. Patient[facility] Presented today with a several month complaint of weakness and fatigue. Had outpatient labs obtained. Dr. Sherrye PayorParini called the patient at home, and summoned EMS to her home because of a potassium of 2.2, and hemoglobin of 11. Hemoglobin had dropped from 14 and August of last year.  She does report dark stools over the last several months, intermittently. No red blood. She is on Lasix and potassium at home.  Her EMS patient was reluctant, hospital stating she "didn't feel that bad. She walked embolus was driven here. I was called to the room by RN, with pt's  family or screaming "we need help we need help" arrived to find her unresponsive. Placed on a monitor and found to be in ventricular tachycardia.    Past Medical History  Diagnosis Date  . Glaucoma   . Hypertension   . Dysrhythmia   . Atrial fibrillation   . CHF (congestive heart failure)     Diastolic dysfunction   Past Surgical History  Procedure Laterality Date  . Abdominal hysterectomy     Family History  Problem Relation Age of Onset  . Cancer Father   . CVA Mother    History  Substance Use Topics  . Smoking status: Former Games developermoker  . Smokeless tobacco: Never Used  . Alcohol Use: No   OB History    No data available     Review of Systems  Unable to perform ROS: Other      Allergies  Lisinopril  Home Medications   Prior to Admission medications   Medication Sig Start Date End Date Taking? Authorizing Provider  dabigatran (PRADAXA) 150 MG CAPS Take 1 capsule (150 mg total) by mouth 2 (two) times daily. 04/26/12   Rosalio MacadamiaLori C Gerhardt, NP  furosemide (LASIX) 40 MG tablet Take 1.5 tablets (60 mg total) by  mouth daily. 04/26/13   Vassie Lollarlos Madera, MD  latanoprost (XALATAN) 0.005 % ophthalmic solution Place 1 drop into both eyes 2 (two) times daily.  01/21/13   Historical Provider, MD  losartan (COZAAR) 50 MG tablet Take 1 tablet (50 mg total) by mouth 2 (two) times daily. 03/27/14   Rollene RotundaJames Hochrein, MD  metoprolol tartrate (LOPRESSOR) 25 MG tablet Take 1 tablet (25 mg total) by mouth 2 (two) times daily. 01/03/14   Rollene RotundaJames Hochrein, MD  potassium chloride (K-DUR,KLOR-CON) 10 MEQ tablet Take 3 tablets (30 mEq total) by mouth daily. 03/24/14   Rollene RotundaJames Hochrein, MD  pravastatin (PRAVACHOL) 20 MG tablet Take 20 mg by mouth at bedtime.    Historical Provider, MD  traZODone (DESYREL) 100 MG tablet Take 1 tablet (100 mg total) by mouth at bedtime as needed for sleep. 12/20/13   Shanker Levora DredgeM Ghimire, MD   BP 145/64 mmHg  Pulse 71  Temp(Src) 98 F (36.7 C) (Oral)  Resp 21  Ht 5\' 5"  (1.651 m)  Wt 140 lb (63.504 kg)  BMI 23.30 kg/m2  SpO2 93% Physical Exam  Constitutional: No distress.  Unresponsive  HENT:  Head: Normocephalic.  Eyes: Conjunctivae are normal. Pupils are equal, round, and reactive to light. No scleral icterus.  Neck: Normal range of motion. Neck supple. No  thyromegaly present.  Cardiovascular: Exam reveals no gallop and no friction rub.   No murmur heard. Initially ventricular tachycardia on the monitor.  Pulmonary/Chest: Effort normal and breath sounds normal. No respiratory distress. She has no wheezes. She has no rales.  Abdominal: Soft. Bowel sounds are normal. She exhibits no distension. There is no tenderness. There is no rebound.  Musculoskeletal: Normal range of motion.  Neurological:  Unresponsive  Skin: Skin is warm and dry. No rash noted.  Psychiatric: She has a normal mood and affect. Her behavior is normal.    ED Course  CARDIOVERSION Date/Time: 08/25/2014 3:15 PM Performed by: Rolland Porter Authorized by: Rolland Porter Consent: The procedure was performed in an emergent situation.  Verbal consent not obtained. Written consent not obtained. Patient sedated: no Cardioversion basis: emergent Patient position: patient was placed in a supine position Chest area: chest area exposed Electrodes: pads Electrodes placed: anterior-posterior Number of attempts: 1 Attempt 1 mode: asynchronous Attempt 1 waveform: monophasic Attempt 1 shock (in Joules): 200 Attempt 1 outcome: conversion to normal sinus rhythm Post-procedure rhythm: normal sinus rhythm Patient tolerance: Patient tolerated the procedure well with no immediate complications Comments: Hx versus sinus with bradycardia. Given IV atropine 1 mg. Rate improves. Becomes awake alert. Does not require admission   (including critical care time) Labs Review Labs Reviewed  CBC - Abnormal; Notable for the following:    Hemoglobin 11.9 (*)    All other components within normal limits  COMPREHENSIVE METABOLIC PANEL - Abnormal; Notable for the following:    Potassium 2.8 (*)    Chloride 90 (*)    CO2 39 (*)    Glucose, Bld 129 (*)    Creatinine, Ser 1.11 (*)    Total Protein 5.6 (*)    Albumin 2.9 (*)    GFR calc non Af Amer 44 (*)    GFR calc Af Amer 51 (*)    All other components within normal limits  I-STAT CHEM 8, ED - Abnormal; Notable for the following:    Potassium 2.6 (*)    Chloride 86 (*)    Glucose, Bld 116 (*)    Calcium, Ion 1.00 (*)    All other components within normal limits  MAGNESIUM  BASIC METABOLIC PANEL  BASIC METABOLIC PANEL  TYPE AND SCREEN  ABO/RH    Imaging Review No results found.   EKG Interpretation None     Cardiopulmonary Resuscitation (CPR) Procedure Note Directed/Performed by: Claudean Kinds I personally directed ancillary staff and/or performed CPR in an effort to regain return of spontaneous circulation and to maintain cardiac, neuro and systemic perfusion.     MDM   Final diagnoses:  Ventricular tachycardia  Hypokalemia    Patient given by mouth, and IV  potassium. Care discussed with hospitalist. She'll be admitted.  After patient stabilize it did have a long discussion with the family. They state that she is DO NOT RESUSCITATE and would not want intubation, mechanical ventilation or CPR. They feel she would be a with medical treatment. I discussed this with her. She states that potassium replacement is acceptable. I discussed case with Dr. Waynard Edwards and updated on the patient's condition. She is being [facility].   Rolland Porter, MD 08/25/14 [phone]

## 2014-08-25 NOTE — ED Notes (Signed)
Pt reports dark stool x "months" and has been seen by pcp for this, pt denies any n/v/d or abd distention or tenderness. Pt was seen at pcp for weakness and fatigue and was told hgb was 11 and K-2.2, pt states that she does take potassium supplements at home. nad noted. Pt denies any pain at this time, axo x4.

## 2014-08-25 NOTE — ED Notes (Signed)
Pt and family reports recent fatigue, weakness, frequent falls and went to pcp today and told to come here due to abnormal labs. Hgb 11 and K 2.2.

## 2014-08-25 NOTE — ED Notes (Signed)
Resident at bedside to [address], Timeout done.

## 2014-08-25 NOTE — H&P (Addendum)
History and Physical    Sydney Janskyharon A Bugarin ZOX:[id number] DOB: 11/22/1927 DOA: 08/25/2014  Referring physician: Dr. Fayrene FearingJames PCP: Ezequiel KayserPERINI,MARK A, MD  Specialists: none  Chief Complaint: V fib arrest   HPI: Sydney Hensley is a 81 y.o. female has a past medical history significant for hypertension, hyperlipidemia, history of CHF with preserved systolic function, atrial fibrillation, presents to the emergency room by the direction of her PCP for low potassium. Prior to being evaluated by the ED physician, patient became unresponsive and was found to be in V. fib on the monitor. She received a round of chest compressions as well as a shock with return of sinus rhythm, this was shortly followed by profound bradycardia requiring more chest compressions as well as atropine. On my evaluation, patient is alert and oriented to place and person, she has no specific complaints. Per family, who is at bedside, she's been having some dark stools for several months and was evaluated by her PCP and was supposed to have a GI referral soon. He did blood work today in her PCPs office, she was seen over there for weakness and fatigue, she was found to be anemic with a hemoglobin of 11 as well as profoundly hypokalemic with a potassium of 2.2, and was directed to come to the emergency room. In the ED her potassium is 2.8 and on repeat is 2.6. Per family, other than the weakness and black tarry stools, she has complained of any chest pains, shortness of breath, and is otherwise feeling at baseline. Patient herself is a rather poor historian. Family tells me that she has been progressively less ambulatory, has been eating less and less and has had several probable falls the patient does not remember but she has an obvious bruise around her left eye but nobody knows how it appeared. [facility] asked for admission for hyperkalemia.  Review of Systems:   Past Medical History  Diagnosis Date  . Glaucoma   . Hypertension   . Dysrhythmia    . Atrial fibrillation   . CHF (congestive heart failure)     Diastolic dysfunction   Past Surgical History  Procedure Laterality Date  . Abdominal hysterectomy     Social History:  reports that she has quit smoking. She has never used smokeless tobacco. She reports that she does not drink alcohol or use illicit drugs.  Allergies  Allergen Reactions  . Lisinopril Other (See Comments)    Cough    Family History  Problem Relation Age of Onset  . Cancer Father   . CVA Mother     Prior to Admission medications   Medication Sig Start Date End Date Taking? Authorizing Provider  dabigatran (PRADAXA) 150 MG CAPS Take 1 capsule (150 mg total) by mouth 2 (two) times daily. 04/26/12   Rosalio MacadamiaLori C Gerhardt, NP  furosemide (LASIX) 40 MG tablet Take 1.5 tablets (60 mg total) by mouth daily. 04/26/13   Vassie Lollarlos Madera, MD  latanoprost (XALATAN) 0.005 % ophthalmic solution Place 1 drop into both eyes 2 (two) times daily.  01/21/13   Historical Provider, MD  losartan (COZAAR) 50 MG tablet Take 1 tablet (50 mg total) by mouth 2 (two) times daily. 03/27/14   Rollene RotundaJames Hochrein, MD  metoprolol tartrate (LOPRESSOR) 25 MG tablet Take 1 tablet (25 mg total) by mouth 2 (two) times daily. 01/03/14   Rollene RotundaJames Hochrein, MD  potassium chloride (K-DUR,KLOR-CON) 10 MEQ tablet Take 3 tablets (30 mEq total) by mouth daily. 03/24/14   Rollene RotundaJames Hochrein, MD  pravastatin (PRAVACHOL) 20 MG tablet Take 20 mg by mouth at bedtime.    Historical Provider, MD  traZODone (DESYREL) 100 MG tablet Take 1 tablet (100 mg total) by mouth at bedtime as needed for sleep. 12/20/13   Shanker Levora Dredge, MD   Physical Exam: Filed Vitals:   08/25/14 1529 08/25/14 1530 08/25/14 1531 08/25/14 1532  BP:  146/67    Pulse: 63 62 69 58  Temp:      TempSrc:      Resp: Height:      Weight:      SpO2: 100% 100% 99% 100%    General:  Ill appearing, cachectic  Eyes: PERRL, EOMI, no scleral icterus, ecchymosis left eye  ENT: moist  oropharynx  Cardiovascular: regular rate without MRG; 2+ peripheral pulses, no JVD, trace peripheral edema  Respiratory: CTA biL, good air movement without wheezing  Abdomen: soft, non tender to palpation, positive bowel sounds, no guarding, no rebound  Skin: no rashes, pale  Musculoskeletal: decreased bulk and tone, no joint swelling  Psychiatric: flat affect  Neurologic: non focal   Labs on Admission:  Basic Metabolic Panel:  Recent Labs Lab 08/25/14 1410 08/25/14 1513  NA 137 136  K 2.8* 2.6*  CL 90* 86*  CO2 39*  --   GLUCOSE 129* 116*  BUN 10 10  CREATININE 1.11* 1.00  CALCIUM 8.8  --   MG 1.7  --    Liver Function Tests:  Recent Labs Lab 08/25/14 1410  AST 24  ALT 13  ALKPHOS 41  BILITOT 1.2  PROT 5.6*  ALBUMIN 2.9*   CBC:  Recent Labs Lab 08/25/14 1412 08/25/14 1513  WBC 7.8  --   HGB 11.9* 13.9  HCT 37.0 41.0  MCV 93.9  --   PLT 191  --    Radiological Exams on Admission: No results found.  EKG: Independently reviewed.  Assessment/Plan Principal Problem:   Ventricular fibrillation Active Problems:   Congestive heart failure   Atrial fibrillation   Glaucoma   Hypertension   Hyperlipidemia   Hypokalemia   Cardiac arrest / ventricular fibrillation / profound bradycardia - this is likely in the setting of hypokalemia. We'll repeat potassium IV as a bolus as well as maintenance fluids overnight, as well as by mouth. Check a magnesium level. She is on Lasix at home, hold this for now. Hold beta blockers for now. - We'll admit patient to step down for close blood work monitoring, check serial BMPs - gentle fluids at 50 cc/h for 10 hours  Atrial fibrillation - she currently is in sinus rhythm, continue Pradaxa   Hypokalemia - in the setting of poor by mouth intake, ongoing Lasix use. Replete and monitor. Replete magnesium as well.  Hypertension - hold home medications  Dark stools - not a candidate for any form of intervention for  now. Hb stable, will repeat in am.   Diastolic heart failure - appears euvolemic  Goals of care - extensively discussed with the family, patient is DO NOT RESUSCITATE/DO NOT INTUBATE, these seem to be very tearful and have some regrets that they agreed to the code/chest compressions above, they are at peace now and wish to pursue medical interventions with serial BMPs, potassium supplementations in the hopes that this will be reversed and she will not develop any more fatal arrhythmias, but are very clear that they wish no further ACLS if this happens again.    Diet: clears Fluids: NS with 20  mEq 50 cc/h for 10 hours DVT Prophylaxis: Pradaxa  Code Status: DNR Family Communication: d/w family bedside Disposition Plan: admit to SDU  Time spent: 87  Kerstyn Coryell M. Elvera Lennox, MD [facility] Pager [phone]  If 7PM-7AM, please contact night-coverage [email or web address] Password [facility] 08/25/2014, 4:13 PM

## 2014-08-25 NOTE — ED Notes (Signed)
Dr. Fayrene FearingJames at bedside to attempt central line.

## 2014-08-26 ENCOUNTER — Inpatient Hospital Stay (HOSPITAL_COMMUNITY): Payer: Medicare Other

## 2014-08-26 DIAGNOSIS — I5022 Chronic systolic (congestive) heart failure: Secondary | ICD-10-CM

## 2014-08-26 DIAGNOSIS — I482 Chronic atrial fibrillation: Secondary | ICD-10-CM

## 2014-08-26 DIAGNOSIS — I469 Cardiac arrest, cause unspecified: Secondary | ICD-10-CM

## 2014-08-26 DIAGNOSIS — G9340 Encephalopathy, unspecified: Secondary | ICD-10-CM | POA: Diagnosis present

## 2014-08-26 DIAGNOSIS — I10 Essential (primary) hypertension: Secondary | ICD-10-CM

## 2014-08-26 LAB — CBC
HEMATOCRIT: 37.4 % (ref 36.0–46.0)
Hemoglobin: 11.9 g/dL — ABNORMAL LOW (ref 12.0–15.0)
MCH: 30.1 pg (ref 26.0–34.0)
MCHC: 31.8 g/dL (ref 30.0–36.0)
MCV: 94.4 fL (ref 78.0–100.0)
Platelets: 206 10*3/uL (ref 150–400)
RBC: 3.96 MIL/uL (ref 3.87–5.11)
RDW: 14.6 % (ref 11.5–15.5)
WBC: 10.9 10*3/uL — AB (ref 4.0–10.5)

## 2014-08-26 LAB — BASIC METABOLIC PANEL
ANION GAP: 18 — AB (ref 5–15)
ANION GAP: 14 (ref 5–15)
Anion gap: 5 (ref 5–15)
BUN: 9 mg/dL (ref 6–23)
BUN: 8 mg/dL (ref 6–23)
BUN: 10 mg/dL (ref 6–23)
CHLORIDE: 91 mmol/L — AB (ref 96–112)
CO2: 28 mmol/L (ref 19–32)
CO2: 38 mmol/L — ABNORMAL HIGH (ref 19–32)
CO2: 28 mmol/L (ref 19–32)
CREATININE: 1.14 mg/dL — AB (ref 0.50–1.10)
Calcium: 8.3 mg/dL — ABNORMAL LOW (ref 8.4–10.5)
Calcium: 8.6 mg/dL (ref 8.4–10.5)
Calcium: 8.8 mg/dL (ref 8.4–10.5)
Chloride: 96 mmol/L (ref 96–112)
Chloride: 97 mmol/L (ref 96–112)
Creatinine, Ser: 1.09 mg/dL (ref 0.50–1.10)
Creatinine, Ser: 1.1 mg/dL (ref 0.50–1.10)
GFR calc Af Amer: 52 mL/min — ABNORMAL LOW (ref >90)
GFR calc non Af Amer: 45 mL/min — ABNORMAL LOW (ref >90)
GFR calc non Af Amer: 43 mL/min — ABNORMAL LOW (ref >90)
GFR calc non Af Amer: 45 mL/min — ABNORMAL LOW (ref >90)
GFR, EST AFRICAN AMERICAN: 52 mL/min — AB (ref >90)
GFR, EST AFRICAN AMERICAN: 50 mL/min — AB (ref >90)
Glucose, Bld: 160 mg/dL — ABNORMAL HIGH (ref 70–99)
Glucose, Bld: 151 mg/dL — ABNORMAL HIGH (ref 70–99)
Glucose, Bld: 170 mg/dL — ABNORMAL HIGH (ref 70–99)
Potassium: 2.8 mmol/L — ABNORMAL LOW (ref 3.5–5.1)
Potassium: 3.4 mmol/L — ABNORMAL LOW (ref 3.5–5.1)
Potassium: 3.9 mmol/L (ref 3.5–5.1)
SODIUM: 139 mmol/L (ref 135–145)
Sodium: 137 mmol/L (ref 135–145)
Sodium: 139 mmol/L (ref 135–145)

## 2014-08-26 LAB — TROPONIN I
Troponin I: 0.06 ng/mL — ABNORMAL HIGH (ref <0.031)
Troponin I: 0.06 ng/mL — ABNORMAL HIGH (ref <0.031)
Troponin I: 0.07 ng/mL — ABNORMAL HIGH (ref <0.031)

## 2014-08-26 LAB — MAGNESIUM: MAGNESIUM: 2 mg/dL (ref 1.5–2.5)

## 2014-08-26 MED ORDER — ONDANSETRON HCL 4 MG/2ML IJ SOLN
4.0000 mg | Freq: Four times a day (QID) | INTRAMUSCULAR | Status: DC | PRN
  Administered 2014-08-26: 4 mg via INTRAVENOUS
  Filled 2014-08-26: qty 2

## 2014-08-26 MED ORDER — POTASSIUM CHLORIDE CRYS ER 20 MEQ PO TBCR
40.0000 meq | EXTENDED_RELEASE_TABLET | Freq: Once | ORAL | Status: AC
  Administered 2014-08-26: 40 meq via ORAL
  Filled 2014-08-26: qty 2

## 2014-08-26 MED ORDER — PANTOPRAZOLE SODIUM 40 MG PO TBEC
40.0000 mg | DELAYED_RELEASE_TABLET | Freq: Every day | ORAL | Status: DC
  Administered 2014-08-27 – 2014-08-31 (×5): 40 mg via ORAL
  Filled 2014-08-26 (×5): qty 1

## 2014-08-26 MED ORDER — POTASSIUM CHLORIDE CRYS ER 20 MEQ PO TBCR
40.0000 meq | EXTENDED_RELEASE_TABLET | Freq: Two times a day (BID) | ORAL | Status: DC
  Administered 2014-08-27 – 2014-08-30 (×8): 40 meq via ORAL
  Filled 2014-08-26 (×8): qty 2

## 2014-08-26 MED ORDER — LOSARTAN POTASSIUM 50 MG PO TABS
50.0000 mg | ORAL_TABLET | Freq: Two times a day (BID) | ORAL | Status: DC
  Administered 2014-08-26 – 2014-08-31 (×11): 50 mg via ORAL
  Filled 2014-08-26 (×11): qty 1

## 2014-08-26 MED ORDER — MAGNESIUM SULFATE IN D5W 10-5 MG/ML-% IV SOLN
1.0000 g | Freq: Once | INTRAVENOUS | Status: AC
  Administered 2014-08-26: 1 g via INTRAVENOUS
  Filled 2014-08-26: qty 100

## 2014-08-26 MED ORDER — HYDRALAZINE HCL 20 MG/ML IJ SOLN
5.0000 mg | Freq: Four times a day (QID) | INTRAMUSCULAR | Status: DC | PRN
  Administered 2014-08-26 – 2014-08-27 (×2): 5 mg via INTRAVENOUS
  Filled 2014-08-26 (×2): qty 1

## 2014-08-26 MED ORDER — FUROSEMIDE 20 MG PO TABS
60.0000 mg | ORAL_TABLET | Freq: Every day | ORAL | Status: DC
  Administered 2014-08-26 – 2014-08-27 (×2): 60 mg via ORAL
  Filled 2014-08-26 (×4): qty 1

## 2014-08-26 MED ORDER — METOPROLOL SUCCINATE ER 25 MG PO TB24
25.0000 mg | ORAL_TABLET | Freq: Two times a day (BID) | ORAL | Status: DC
  Administered 2014-08-26 – 2014-08-31 (×10): 25 mg via ORAL
  Filled 2014-08-26 (×11): qty 1

## 2014-08-26 NOTE — Progress Notes (Signed)
Utilization review complete 

## 2014-08-26 NOTE — Consult Note (Signed)
CARDIOLOGY CONSULT NOTE  Patient ID: Sydney Hensley MRN: [id number] DOB/AGE: 79/06/1927 79 y.o.  Admit date: 08/25/2014 Primary Cardiologist: Hochrein Reason for Consultation: Ventricular fibrillation  HPI: 81 yo with history of chronic atrial fibrillation on Pradaxa, chronic diastolic CHF, and HTN is seen after ventricular fibrillation arrest.  Patient had been stable at home alone.  Nephew looks in on her and lays out her medications.  He thinks that she takes them as the pills are always gone when he comes back.  She saw her PCP yesterday and was told to go to the ER because of K 2.2.  She went to the ER, and in the ER she had a ventricular fibrillation arrest (on 4/8).  She was defibrillated and had CPR.  She then had a bradycardic PEA rhythm that was terminated with atropine. She recovered consciousness afterwards.  She received K and Mg.   Today, she is alert but confused.  SBP in 170s, HR in 70s (atrial fibrillation).  Denies chest pain or dyspnea.  Troponin mildly elevated with no trend (0.06).  She has, by report, had some dark stool at home but is minimally anemic.   Review of systems complete and found to be negative unless listed above in HPI  Past Medical History: 1. Atrial fibrillation: Chronic, on Pradaxa.  2. Chronic diastolic CHF: Echo (12/14) with EF 60-65%, mild RV dilation, PA systolic pressure 55 mmHg.  3. HTN 4. Hyperlipidemia 5. Anemia 6. Glaucoma 7. DNR/DNI  Family History  Problem Relation Age of Onset  . Cancer Father   . CVA Mother     History   Social History  . Marital Status: Single    Spouse Name: N/A  . Number of Children: N/A  . Years of Education: N/A   Occupational History  . Not on file.   Social History Main Topics  . Smoking status: Former Smoker    Types: Cigarettes  . Smokeless tobacco: Never Used     Comment: "smoked some in her 20's"  . Alcohol Use: No  . Drug Use: No  . Sexual Activity: No   Other Topics Concern  . Not  on file   Social History Narrative     Prescriptions prior to admission  Medication Sig Dispense Refill Last Dose  . acetaminophen (TYLENOL) 500 MG tablet Take 1,000 mg by mouth at bedtime.   08/24/2014 at Unknown time  . brimonidine-timolol (COMBIGAN) 0.2-0.5 % ophthalmic solution Place 1 drop into both eyes every 12 (twelve) hours.   08/25/2014 at am  . dabigatran (PRADAXA) 150 MG CAPS Take 1 capsule (150 mg total) by mouth 2 (two) times daily. 60 capsule  08/25/2014 at am  . dorzolamide-timolol (COSOPT) 22.3-6.8 MG/ML ophthalmic solution Place 1 drop into the right eye 2 (two) times daily.   08/25/2014 at am  . escitalopram (LEXAPRO) 20 MG tablet Take 10 mg by mouth at bedtime.   08/24/2014 at Unknown time  . furosemide (LASIX) 40 MG tablet Take 1.5 tablets (60 mg total) by mouth daily. 45 tablet 1 08/25/2014 at Unknown time  . latanoprost (XALATAN) 0.005 % ophthalmic solution Place 1 drop into both eyes at bedtime.    08/24/2014 at Unknown time  . losartan (COZAAR) 50 MG tablet Take 1 tablet (50 mg total) by mouth 2 (two) times daily. 180 tablet 3 08/25/2014 at am  . metoprolol tartrate (LOPRESSOR) 25 MG tablet Take 1 tablet (25 mg total) by mouth 2 (two) times daily. (Patient taking differently:  Take 50 mg by mouth 2 (two) times daily. ) 180 tablet 3 08/25/2014 at Unknown time  . NONFORMULARY OR COMPOUNDED ITEM Apply 1 application topically 4 (four) times daily as needed (for back itching and irritations).   08/25/2014 at Unknown time  . pravastatin (PRAVACHOL) 20 MG tablet Take 20 mg by mouth at bedtime.   08/24/2014 at Unknown time   Current Scheduled Meds: . dabigatran  150 mg Oral BID  . furosemide  60 mg Oral Daily  . latanoprost  1 drop Both Eyes QHS  . losartan  50 mg Oral BID  . magnesium sulfate 1 - 4 g bolus IVPB  1 g Intravenous Once  . metoprolol succinate  25 mg Oral BID  . potassium chloride  40 mEq Oral Once  . [START ON 08/27/2014] potassium chloride  40 mEq Oral BID  . pravastatin  20 mg  Oral QHS  . sodium chloride  3 mL Intravenous Q12H   Continuous Infusions:  PRN Meds:.hydrALAZINE, ondansetron (ZOFRAN) IV   Physical exam Blood pressure 170/87, pulse 73, temperature 98.1 F (36.7 C), temperature source Oral, resp. rate 30, height  (1.651 m), weight 140 lb (63.504 kg), SpO2 93 %. General: NAD Neck: JVP 10 cm, no thyromegaly or thyroid nodule.  Lungs: Dependent crackles CV: Nondisplaced PMI.  Heart irregular S1/S2, no S3/S4, no murmur.  No peripheral edema.  No carotid bruit.   Abdomen: Soft, nontender, no hepatosplenomegaly, no distention.  Skin: Intact without lesions or rashes.  Neurologic: Alert, confused  Psych: Normal affect. Extremities: No clubbing or cyanosis.  HEENT: Normal.   Labs:   Lab Results  Component Value Date   WBC 10.9* 08/26/2014   HGB 11.9* 08/26/2014   HCT 37.4 08/26/2014   MCV 94.4 08/26/2014   PLT 206 08/26/2014    Recent Labs Lab 08/25/14 1410  08/26/14 1258  NA 137  < > 139  K 2.8*  < > 3.9  CL 90*  < > 97  CO2 39*  < > 28  BUN 10  < > 10  CREATININE 1.11*  < > 1.10  CALCIUM 8.8  < > 8.8  PROT 5.6*  --   --   BILITOT 1.2  --   --   ALKPHOS 41  --   --   ALT 13  --   --   AST 24  --   --   GLUCOSE 129*  < > 170*  < > = values in this interval not displayed. Lab Results  Component Value Date   TROPONINI 0.06* 08/26/2014   EKG: Atrial fibrillation with IVCD  ASSESSMENT AND PLAN: 81 yo with history of chronic atrial fibrillation on Pradaxa, chronic diastolic CHF, and HTN is seen after ventricular fibrillation arrest.  1. Ventricular fibrillation: In setting of hypokalemia, K 2.2.  Etiology of hypokalemia not totally clear but may have been taking Lasix without potassium at home with hypokalemic arrest.  No chest pain.  Troponin minimally elevated with no trend.  - Keep K and Mg levels up.  - Start Toprol XL 25 mg bid.  - Needs echo.  - Discussed with nephews (she is confused), want to avoid invasive evaluation.   Therefore, no catheterization or stress test.  2. Atrial fibrillation: Chronic.  She has been on Pradaxa at home, this was continued.  3. Chronic diastolic CHF: Elevated JVP, suspect that there is some volume overload.  She is going to need Lasix chronically.  We will have to make  sure that she takes her potassium in the future. Will restart Lasix 60 mg daily but make sure to keep electrolytes up.  4. Confusion: Suspect this is a post-arrest phenomenon.  Will, however, get CT head to rule out bleed or large CVA.   Marca Ancona 08/26/2014 2:49 PM   Signed: @ 08/26/2014, 2:37 PM Co-Sign MD

## 2014-08-26 NOTE — Progress Notes (Signed)
[facility] PROGRESS NOTE  Sydney Hensley A Garrod ZOX:[id number] DOB: 09/02/1927 DOA: 08/25/2014 PCP: Ezequiel KayserPERINI,MARK A, MD  Assessment/Plan: #1 ventricular fibrillation cardiac arrest Secondary to electrolyte abnormalities of hypokalemia. Status post defibrillation and CPR. Patient subsequently went into bradycardia with PA and given atropine. Cardiac enzymes minimally elevated however seem to a plateaued. Patient's beta blocker was held however has been resumed per cardiology. 2-D echo has been ordered per cardiology. Cardiology following.  #2 hypokalemia Likely secondary to diuretics. Magnesium level at 2. Replete.  #3 acute encephalopathy Patient with confusion. Likely secondary to post arrest. CT head was ordered her cardiology which is negative for any acute abnormalities. Will check a UA with cultures and sensitivities. Will check a chest x-ray. Follow.  #4 chronic atrial fibrillation Beta blocker for rate control. Pradaxa for anticoagulation.  #5 chronic diastolic heart failure Cardiac enzymes minimally elevated but have plateaued. 2-D echo has been ordered per cardiology. Patient has been started on diuretics of Lasix daily per cardiology. Follow.  #6  Dark stools Patient's hemoglobin has pretty much been stable with no significant drop. Check FOBT.Place on a PPI. Follow H&H.  #7 hypertension Cozaar has been resumed. Beta blocker has been resumed. Follow.  #8 prophylaxis Placed on a PPI for GI prophylaxis. Pradaxa for anticoagulation.  Code Status: DO NOT RESUSCITATE Family Communication: No family at bedside. Disposition Plan: Home when medically stable.   Consultants:  Cardiology: Dr. Shirlee LatchMcLean 08/26/2014  Procedures:  CT head 08/26/2014    Antibiotics:  None  HPI/Subjective: Patient confused.  Objective: Filed Vitals:   08/26/14 0902  BP: 196/100  Pulse: 72  Temp: 97.6 F (36.4 C)  Resp:    No intake or output data in the 24 hours ending 08/26/14  0925 Filed Weights   08/25/14 1358  Weight: 63.504 kg (140 lb)    Exam:   General:  Confused  Cardiovascular: Regular rate and rhythm no murmurs rubs or gallops  Respiratory: Clear to auscultation bilaterally anterior lung fields  Abdomen: Soft, nontender, nondistended, positive bowel sounds.  Musculoskeletal: No clubbing cyanosis or edema.   Data Reviewed: Basic Metabolic Panel:  Recent Labs Lab 08/25/14 1410 08/25/14 1513 08/25/14 1949 08/26/14 0114 08/26/14 0758  NA 137 136 139 137 139  K 2.8* 2.6* 2.4* 2.8* 3.4*  CL 90* 86* 89* 91* 96  CO2 39*  --  33* 28 38*  GLUCOSE 129* 116* 134* 160* 151*  BUN 10 10 9 9 8   CREATININE 1.11* 1.00 1.09 1.09 1.14*  CALCIUM 8.8  --  8.3* 8.3* 8.6  MG 1.7  --   --   --   --    Liver Function Tests:  Recent Labs Lab 08/25/14 1410  AST 24  ALT 13  ALKPHOS 41  BILITOT 1.2  PROT 5.6*  ALBUMIN 2.9*   No results for input(s): LIPASE, AMYLASE in the last 168 hours. No results for input(s): AMMONIA in the last 168 hours. CBC:  Recent Labs Lab 08/25/14 1412 08/25/14 1513 08/26/14 0758  WBC 7.8  --  10.9*  HGB 11.9* 13.9 11.9*  HCT 37.0 41.0 37.4  MCV 93.9  --  94.4  PLT 191  --  206   Cardiac Enzymes: No results for input(s): CKTOTAL, CKMB, CKMBINDEX, TROPONINI in the last 168 hours. BNP (last 3 results) No results for input(s): BNP in the last 8760 hours.  ProBNP (last 3 results) No results for input(s): PROBNP in the last 8760 hours.  CBG: No results for input(s): GLUCAP in the  last 168 hours.  Recent Results (from the past 240 hour(s))  MRSA PCR Screening     Status: None   Collection Time: 08/25/14  5:04 PM  Result Value Ref Range Status   MRSA by PCR NEGATIVE NEGATIVE Final    Comment:        The GeneXpert MRSA Assay (FDA approved for NASAL specimens only), is one component of a comprehensive MRSA colonization surveillance program. It is not intended to diagnose MRSA infection nor to guide  or monitor treatment for MRSA infections.      Studies: No results found.  Scheduled Meds: . dabigatran  150 mg Oral BID  . latanoprost  1 drop Both Eyes QHS  . losartan  50 mg Oral BID  . potassium chloride  40 mEq Oral Once  . pravastatin  20 mg Oral QHS  . sodium chloride  3 mL Intravenous Q12H   Continuous Infusions:   Principal Problem:   Ventricular fibrillation Active Problems:   Hypokalemia   Congestive heart failure   Atrial fibrillation   Glaucoma   Hypertension   Hyperlipidemia    Time spent: 40 minutes    Kolbee Bogusz M.D. [facility] Pager [phone]. If 7PM-7AM, please contact night-coverage at [email or web address], password [facility] 08/26/2014, 9:25 AM  LOS: 1 day

## 2014-08-27 DIAGNOSIS — I5021 Acute systolic (congestive) heart failure: Secondary | ICD-10-CM | POA: Clinically undetermined

## 2014-08-27 DIAGNOSIS — I469 Cardiac arrest, cause unspecified: Secondary | ICD-10-CM

## 2014-08-27 DIAGNOSIS — I4891 Unspecified atrial fibrillation: Secondary | ICD-10-CM

## 2014-08-27 LAB — BASIC METABOLIC PANEL
ANION GAP: 14 (ref 5–15)
BUN: 15 mg/dL (ref 6–23)
CALCIUM: 8.9 mg/dL (ref 8.4–10.5)
CHLORIDE: 101 mmol/L (ref 96–112)
CO2: 27 mmol/L (ref 19–32)
Creatinine, Ser: 1.21 mg/dL — ABNORMAL HIGH (ref 0.50–1.10)
GFR calc Af Amer: 46 mL/min — ABNORMAL LOW (ref >90)
GFR calc non Af Amer: 40 mL/min — ABNORMAL LOW (ref >90)
GLUCOSE: 130 mg/dL — AB (ref 70–99)
POTASSIUM: 4.2 mmol/L (ref 3.5–5.1)
SODIUM: 142 mmol/L (ref 135–145)

## 2014-08-27 LAB — CBC
HEMATOCRIT: 39.4 % (ref 36.0–46.0)
Hemoglobin: 12.6 g/dL (ref 12.0–15.0)
MCH: 31 pg (ref 26.0–34.0)
MCHC: 32 g/dL (ref 30.0–36.0)
MCV: 96.8 fL (ref 78.0–100.0)
Platelets: 153 10*3/uL (ref 150–400)
RBC: 4.07 MIL/uL (ref 3.87–5.11)
RDW: 15.4 % (ref 11.5–15.5)
WBC: 14.6 10*3/uL — ABNORMAL HIGH (ref 4.0–10.5)

## 2014-08-27 LAB — URINALYSIS, ROUTINE W REFLEX MICROSCOPIC
Bilirubin Urine: NEGATIVE
GLUCOSE, UA: NEGATIVE mg/dL
Hgb urine dipstick: NEGATIVE
Ketones, ur: NEGATIVE mg/dL
Leukocytes, UA: NEGATIVE
NITRITE: NEGATIVE
Protein, ur: NEGATIVE mg/dL
Specific Gravity, Urine: 1.009 (ref 1.005–1.030)
Urobilinogen, UA: 1 mg/dL (ref 0.0–1.0)
pH: 7 (ref 5.0–8.0)

## 2014-08-27 LAB — BRAIN NATRIURETIC PEPTIDE: B Natriuretic Peptide: 3485.6 pg/mL — ABNORMAL HIGH (ref 0.0–100.0)

## 2014-08-27 LAB — MAGNESIUM: MAGNESIUM: 2.2 mg/dL (ref 1.5–2.5)

## 2014-08-27 MED ORDER — DORZOLAMIDE HCL-TIMOLOL MAL 2-0.5 % OP SOLN: 1.0000 [drp] | Freq: Two times a day (BID) | OPHTHALMIC | Status: DC

## 2014-08-27 MED ORDER — BRIMONIDINE TARTRATE-TIMOLOL 0.2-0.5 % OP SOLN
1.0000 [drp] | Freq: Two times a day (BID) | OPHTHALMIC | Status: DC
  Filled 2014-08-27: qty 5

## 2014-08-27 MED ORDER — FUROSEMIDE 10 MG/ML IJ SOLN
60.0000 mg | Freq: Two times a day (BID) | INTRAMUSCULAR | Status: DC
  Administered 2014-08-27 – 2014-08-28 (×2): 60 mg via INTRAVENOUS
  Filled 2014-08-27 (×2): qty 6

## 2014-08-27 MED ORDER — BRIMONIDINE TARTRATE 0.2 % OP SOLN
1.0000 [drp] | Freq: Two times a day (BID) | OPHTHALMIC | Status: DC
  Administered 2014-08-27 – 2014-08-31 (×8): 1 [drp] via OPHTHALMIC
  Filled 2014-08-27: qty 5

## 2014-08-27 MED ORDER — DORZOLAMIDE HCL 2 % OP SOLN
1.0000 [drp] | Freq: Two times a day (BID) | OPHTHALMIC | Status: DC
  Administered 2014-08-27 – 2014-08-31 (×8): 1 [drp] via OPHTHALMIC
  Filled 2014-08-27: qty 10

## 2014-08-27 MED ORDER — TIMOLOL MALEATE 0.5 % OP SOLN
1.0000 [drp] | Freq: Two times a day (BID) | OPHTHALMIC | Status: DC
  Administered 2014-08-27 – 2014-08-31 (×8): 1 [drp] via OPHTHALMIC
  Filled 2014-08-27: qty 5

## 2014-08-27 MED ORDER — ESCITALOPRAM OXALATE 10 MG PO TABS
10.0000 mg | ORAL_TABLET | Freq: Every day | ORAL | Status: DC
  Administered 2014-08-27 – 2014-08-30 (×4): 10 mg via ORAL
  Filled 2014-08-27 (×4): qty 1

## 2014-08-27 NOTE — Clinical Social Work Psychosocial (Signed)
Clinical Social Work Department BRIEF PSYCHOSOCIAL ASSESSMENT 08/27/2014  Patient:  Sydney Hensley,Sydney Hensley     Account Number:  [id number]     Admit date:  08/25/2014  Clinical Social Worker:  Lavell LusterAMPBELL,Mozetta Murfin BRYANT, LCSWA  Date/Time:  08/27/2014 03:12 PM  Referred by:  Physician  Date Referred:  08/27/2014 Referred for  SNF Placement   Other Referral:   NA   Interview type:  Family Other interview type:   Patient's nephew interviewed. CSW attempted to wake patient at bedside, but she would not wake.    PSYCHOSOCIAL DATA Living Status:  ALONE Admitted from facility:   Level of care:   Primary support name:  Sydney Hensley Primary support relationship to patient:  FAMILY Degree of support available:   Support is strong.    CURRENT CONCERNS Current Concerns  Post-Acute Placement   Other Concerns:   NA    SOCIAL WORK ASSESSMENT / PLAN CSW spoke with patient's nephew Sydney Hensley to complete assessment. Patient was admitted from home where she lives alone but receives Hensley lot of support from her nephew and his wife. The nephew states that he and his wife are with the patient 5 days Hensley week and usually stay until she goes to bed. CSW explained that SNF has been recommended at discharge. Nephew states that the patient has always adamantly refused placement in the past and that she will never agree to this at discharge. Nephew states that the family will either provide additional support or find Hensley way to get additional supervision as he does not believe Hensley plan for SNF will work for the patient. CSW explained that since family is not agreeable to SNF placement Hensley search will not be started. CSW will leave note for weekday CSW to ask that they follow up to confirm plan will be for home.   Assessment/plan status:  Psychosocial Support/Ongoing Assessment of Needs Other assessment/ plan:   Will ask unit CSW to follow up.   Information/referral to community resources:   NONE    PATIENT'S/FAMILY'S  RESPONSE TO PLAN OF CARE: Patient's nephew states that the patient would not be agreeable to SNF placement and the family would plan for the patient to return home. CSW will ask unit CSW to followup with patient and family at Hensley later time, just to confirm that this is the case. CSW suspects patient will in fact refuse.       Roddie McBryant Thersia Petraglia MSW, [place], [place], [phone]

## 2014-08-27 NOTE — Evaluation (Signed)
Physical Therapy Evaluation Patient Details Name: Sydney Hensley MRN: [id number] DOB: [birth year] Today's Date: 08/27/2014   History of Present Illness  Patient is an 81 yo female admitted 08/25/14 with severe hypokalemia. In ED, patient had V-fib arrest, now with confusion.  PMH:  Afib, HTN, CHF, falls  Clinical Impression  Patient presents with problems listed below.  Will benefit from acute PT to maximize independence prior to discharge.  Patient was independent pta.  Now requires +2 assist for mobility and has decreased cognition.  Recommend SNF at discharge for continued therapy and 24 hour assist.    Follow Up Recommendations SNF;Supervision/Assistance - 24 hour    Equipment Recommendations  Rolling walker with 5" wheels    Recommendations for Other Services       Precautions / Restrictions Precautions Precautions: Fall Precaution Comments: Falls at home. Restrictions Weight Bearing Restrictions: No      Mobility  Bed Mobility Overal bed mobility: Needs Assistance Bed Mobility: Rolling;Sidelying to Sit;Sit to Sidelying Rolling: Min guard Sidelying to sit: Mod assist     Sit to sidelying: Mod assist General bed mobility comments: Verbal cues to use rail to roll.  Patient able to complete rolling to left side with increased time.  Required mod assist to raise trunk to sitting position.  Once upright, patient able to maintain balance with min to min guard assist.  Patient sat 4 minutes and c/o pain and fatigue.  Resp rate increased to 32, HR increased to 81 bpm.  Patient returned to supine with mod assist to bring LE's onto bed.  BP 184/110.  RN notified of vitals.  Transfers                    Ambulation/Gait                Stairs            Wheelchair Mobility    Modified Rankin (Stroke Patients Only)       Balance                                             Pertinent Vitals/Pain Pain Assessment: Faces Faces Pain  Scale: Hurts little more Pain Location: back/chest Pain Descriptors / Indicators: Sore Pain Intervention(s): Monitored during session;Repositioned    Home Living Family/patient expects to be discharged to:: Unsure Living Arrangements: Alone Available Help at Discharge: Family;Available PRN/intermittently (Nephew lives nearby and spends time daily with patient) Type of Home: House Home Access: Stairs to enter Entrance Stairs-Rails: None Entrance Stairs-Number of Steps: 1 Home Layout: One level Home Equipment: Environmental consultant - 2 wheels;Cane - single point;Toilet riser      Prior Function Level of Independence: Independent               Hand Dominance        Extremity/Trunk Assessment   Upper Extremity Assessment: Generalized weakness           Lower Extremity Assessment: Generalized weakness         Communication   Communication: No difficulties  Cognition Arousal/Alertness: Awake/alert Behavior During Therapy: Restless Overall Cognitive Status: Impaired/Different from baseline Area of Impairment: Orientation;Attention;Memory;Safety/judgement;Awareness;Problem solving Orientation Level: Disoriented to;Place;Time;Situation Current Attention Level: Focused Memory: Decreased short-term memory   Safety/Judgement: Decreased awareness of safety   Problem Solving: Slow processing;Difficulty sequencing;Requires verbal cues General Comments: Per nephew, patient cognitively intact pta.  Enjoyed puzzles, [employer]of[employer], and keeping up with "her stories" (soap operas).  Today, patient states she is in her home.  After having her look around the room, she asked "Am I in a hospital?"    General Comments      Exercises        Assessment/Plan    PT Assessment Patient needs continued PT services  PT Diagnosis Difficulty walking;Generalized weakness;Acute pain;Altered mental status   PT Problem List Decreased strength;Decreased activity tolerance;Decreased  balance;Decreased mobility;Decreased cognition;Decreased knowledge of use of DME;Decreased safety awareness;Cardiopulmonary status limiting activity;Pain  PT Treatment Interventions DME instruction;Gait training;Functional mobility training;Therapeutic activities;Therapeutic exercise;Balance training;Cognitive remediation;Patient/family education   PT Goals (Current goals can be found in the Care Plan section) Acute Rehab PT Goals Patient Stated Goal: Unable to state PT Goal Formulation: With family Time For Goal Achievement: 09/10/14 Potential to Achieve Goals: Fair    Frequency Min 3X/week   Barriers to discharge Decreased caregiver support Does not have 24 hour assist.    Co-evaluation               End of Session Equipment Utilized During Treatment: Oxygen Activity Tolerance: No increased pain;Patient limited by fatigue Patient left: in bed;with call bell/phone within reach;with bed alarm set;with nursing/sitter in room;with family/visitor present Nurse Communication: Mobility status (Vitals with mobility)         Time: [id number] PT Time Calculation (min) (ACUTE ONLY): 14 min   Charges:   PT Evaluation $Initial PT Evaluation Tier I: 1 Procedure     PT G CodesVena Austria:        Daelan Gatt H 08/27/2014, 12:08 PM Durenda HurtSusan H. Renaldo Fiddleravis, PT, [facility] Center[facility] Acute Rehab Services Pager [phone]

## 2014-08-27 NOTE — Progress Notes (Signed)
  Echocardiogram 2D Echocardiogram has been performed.  Delcie RochENNINGTON, Kass Herberger 08/27/2014, 12:29 PM

## 2014-08-27 NOTE — Progress Notes (Signed)
Patient ID: Sydney Hensley, female   DOB: 12/15/1927, 81 y.o.   MRN: [id number]  Primary Cardiologist :  Sydney Hensley  Subjective:  Denies SSCP, palpitations or Dyspnea Confused had sitter at night   Objective:  Filed Vitals:   08/26/14 2000 08/26/14 2139 08/27/14 0000 08/27/14 0533  BP: 185/83 149/92  167/82  Pulse: 77 71  77  Temp: 98.2 F (36.8 C) 98.2 F (36.8 C) 99.1 F (37.3 C) 98.4 F (36.9 C)  TempSrc: Oral Oral    Resp: 25 17  18   Height:      Weight:    139 lb 9.6 oz (63.322 kg)  SpO2: 95% 96%  96%    Intake/Output from previous day:  Intake/Output Summary (Last 24 hours) at 08/27/14 0801 Last data filed at 08/26/14 2017  Gross per 24 hour  Intake    440 ml  Output    300 ml  Net    140 ml    Physical Exam: Affect appropriate Confused elderly white female  HEENT: normal Neck supple with no adenopathy JVP normal no bruits no thyromegaly Lungs clear with no wheezing and good diaphragmatic motion Heart:  S1/S2 no murmur, no rub, gallop or click PMI normal Abdomen: benighn, BS positve, no tenderness, no AAA no bruit.  No HSM or HJR Distal pulses intact with no bruits No edema Neuro non-focal Skin warm and dry No muscular weakness   Lab Results: Basic Metabolic Panel:  Recent Labs  40/98/1103/01/02 1410  08/26/14 0758 08/26/14 1258  NA 137  < > 139 139  K 2.8*  < > 3.4* 3.9  CL 90*  < > 96 97  CO2 39*  < > 38* 28  GLUCOSE 129*  < > 151* 170*  BUN 10  < > 8 10  CREATININE 1.11*  < > 1.14* 1.10  CALCIUM 8.8  < > 8.6 8.8  MG 1.7  --  2.0  --   < > = values in this interval not displayed. Liver Function Tests:  Recent Labs  08/25/14 1410  AST 24  ALT 13  ALKPHOS 41  BILITOT 1.2  PROT 5.6*  ALBUMIN 2.9*   CBC:  Recent Labs  08/25/14 1412 08/25/14 1513 08/26/14 0758  WBC 7.8  --  10.9*  HGB 11.9* 13.9 11.9*  HCT 37.0 41.0 37.4  MCV 93.9  --  94.4  PLT 191  --  206   Cardiac Enzymes:  Recent Labs  08/26/14 0758 08/26/14 1258  08/26/14 2000  TROPONINI 0.06* 0.06* 0.07*    Imaging: Ct Head Wo Contrast  08/26/2014   CLINICAL DATA:  Confusion  EXAM: CT HEAD WITHOUT CONTRAST  TECHNIQUE: Contiguous axial images were obtained from the base of the skull through the vertex without intravenous contrast.  COMPARISON:  03/08/2014 and 12/18/2013  FINDINGS: Study is limited by motion artifacts. There is mucosal thickening with partial opacification right maxillary sinus. No intracranial hemorrhage, mass effect or midline shift. Stable atrophy and extensive chronic white matter disease. There is old appearing infarct in right PCA territory see axial image 12. No definite acute cortical infarction.  IMPRESSION: Limited study by motion artifacts. Mucosal thickening with partial opacification right maxillary sinus. Stable atrophy and extensive chronic white matter disease. Old appearing infarct in right PCA territory. No definite acute cortical infarction.   Electronically Signed   By: Sydney MeadLiviu  Hensley M.D.   On: 08/26/2014 16:35   Dg Chest Port 1 View  08/26/2014   CLINICAL DATA:  Confusion  EXAM: PORTABLE CHEST - 1 VIEW  COMPARISON:  12/18/2013  FINDINGS: Cardiomegaly is noted. Central mild vascular congestion without pulmonary edema. Small bilateral pleural effusion with hazy bilateral basilar atelectasis or infiltrate.  IMPRESSION: No pulmonary edema. Small bilateral pleural effusion with hazy bilateral basilar atelectasis or infiltrate.   Electronically Signed   By: Sydney Hensley M.D.   On: 08/26/2014 18:28    Cardiac Studies:  ECG:  afib rate 74  Nonspecific ST changes  No acute changes    Telemetry:  NSR no arrhythmia  Echo:   Medications:   . dabigatran  150 mg Oral BID  . furosemide  60 mg Oral Daily  . latanoprost  1 drop Both Eyes QHS  . losartan  50 mg Oral BID  . metoprolol succinate  25 mg Oral BID  . pantoprazole  40 mg Oral Q0600  . potassium chloride  40 mEq Oral BID  . pravastatin  20 mg Oral QHS  . sodium chloride  3  mL Intravenous Q12H       Assessment/Plan:  Afib:  Chronic continue pradaza and beta blocker Vfib:  Arrest see note by Dr Sydney Latch no aggressive evaluation echo pending  Would not consider further w/u unless EF severely depressed.  In setting Of severe hypokalemia  Will Need K suppl if d/c with diuretic Confusion: ? Anoxic post arrest CT negative for CVA  Sydney Hensley 08/27/2014, 8:01 AM

## 2014-08-27 NOTE — Progress Notes (Signed)
[facility] PROGRESS NOTE  Arcola Janskyharon A Zellner ZOX:[id number] DOB: 09/24/1927 DOA: 08/25/2014 PCP: Ezequiel KayserPERINI,MARK A, MD  Assessment/Plan: #1 ventricular fibrillation cardiac arrest Secondary to electrolyte abnormalities of hypokalemia. Status post defibrillation and CPR. Patient subsequently went into bradycardia with PA and given atropine. Cardiac enzymes minimally elevated however seem to a plateaued. Patient's beta blocker was held however has been resumed per cardiology. 2-D echo pending. Cardiology following.  #2 hypokalemia Likely secondary to diuretics. Magnesium level at 2. Repleted.  #3 acute encephalopathy Patient with improvement with her confusion. Likely secondary to post arrest. CT head was negative for any acute abnormalities. Unable to obtain urinalysis. Chest x-ray negative for any infiltrate.   #4 chronic atrial fibrillation Continue Beta blocker for rate control. Pradaxa for anticoagulation.  #5 chronic diastolic heart failure Cardiac enzymes minimally elevated but have plateaued. 2-D echo pending. Patient has been started on diuretics of Lasix daily per cardiology. Follow.  #6  Dark stools Patient's hemoglobin has pretty much been stable with no significant drop. FOBT pending. Continue PPI. Follow H&H.  #7 hypertension Cozaar has been resumed. Beta blocker has been resumed. Follow.  #8 prophylaxis PPI for GI prophylaxis. Pradaxa for anticoagulation.  Code Status: DO NOT RESUSCITATE Family Communication: Updated nephew and niece and patient at bedside. Disposition Plan: Home when medically stable.   Consultants:  Cardiology: Dr. Shirlee LatchMcLean 08/26/2014  Procedures:  CT head 08/26/2014  2-D echo 08/27/2014 pending  Antibiotics:  None  HPI/Subjective: Patient alert and oriented to self place and time. Mentation improved.  Objective: Filed Vitals:   08/27/14 1220  BP: 186/84  Pulse: 78  Temp:   Resp: 19    Intake/Output Summary (Last 24 hours) at  08/27/14 1237 Last data filed at 08/26/14 2017  Gross per 24 hour  Intake    340 ml  Output    300 ml  Net     40 ml   Filed Weights   08/25/14 1358 08/27/14 0533  Weight: 63.504 kg (140 lb) 63.322 kg (139 lb 9.6 oz)    Exam:   General:  Alert and following commands.  Cardiovascular: Regular rate and rhythm no murmurs rubs or gallops  Respiratory: Clear to auscultation bilaterally anterior lung fields  Abdomen: Soft, nontender, nondistended, positive bowel sounds.  Musculoskeletal: No clubbing cyanosis or edema.   Data Reviewed: Basic Metabolic Panel:  Recent Labs Lab 08/25/14 1410  08/25/14 1949 08/26/14 0114 08/26/14 0758 08/26/14 1258 08/27/14 0648  NA 137  < > 139 137 139 139 142  K 2.8*  < > 2.4* 2.8* 3.4* 3.9 4.2  CL 90*  < > 89* 91* 96 97 101  CO2 39*  --  33* 28 38* 28 27  GLUCOSE 129*  < > 134* 160* 151* 170* 130*  BUN 10  < > 9 9 8 10 15   CREATININE 1.11*  < > 1.09 1.09 1.14* 1.10 1.21*  CALCIUM 8.8  --  8.3* 8.3* 8.6 8.8 8.9  MG 1.7  --   --   --  2.0  --  2.2  < > = values in this interval not displayed. Liver Function Tests:  Recent Labs Lab 08/25/14 1410  AST 24  ALT 13  ALKPHOS 41  BILITOT 1.2  PROT 5.6*  ALBUMIN 2.9*   No results for input(s): LIPASE, AMYLASE in the last 168 hours. No results for input(s): AMMONIA in the last 168 hours. CBC:  Recent Labs Lab 08/25/14 1412 08/25/14 1513 08/26/14 0758 08/27/14 0648  WBC 7.8  --  10.9* 14.6*  HGB 11.9* 13.9 11.9* 12.6  HCT 37.0 41.0 37.4 39.4  MCV 93.9  --  94.4 96.8  PLT 191  --  206 153   Cardiac Enzymes:  Recent Labs Lab 08/26/14 0758 08/26/14 1258 08/26/14 2000  TROPONINI 0.06* 0.06* 0.07*   BNP (last 3 results)  Recent Labs  08/27/14 0648  BNP 3485.6*    ProBNP (last 3 results) No results for input(s): PROBNP in the last 8760 hours.  CBG: No results for input(s): GLUCAP in the last 168 hours.  Recent Results (from the past 240 hour(s))  MRSA PCR  Screening     Status: None   Collection Time: 08/25/14  5:04 PM  Result Value Ref Range Status   MRSA by PCR NEGATIVE NEGATIVE Final    Comment:        The GeneXpert MRSA Assay (FDA approved for NASAL specimens only), is one component of a comprehensive MRSA colonization surveillance program. It is not intended to diagnose MRSA infection nor to guide or monitor treatment for MRSA infections.      Studies: Ct Head Wo Contrast  08/26/2014   CLINICAL DATA:  Confusion  EXAM: CT HEAD WITHOUT CONTRAST  TECHNIQUE: Contiguous axial images were obtained from the base of the skull through the vertex without intravenous contrast.  COMPARISON:  03/08/2014 and 12/18/2013  FINDINGS: Study is limited by motion artifacts. There is mucosal thickening with partial opacification right maxillary sinus. No intracranial hemorrhage, mass effect or midline shift. Stable atrophy and extensive chronic white matter disease. There is old appearing infarct in right PCA territory see axial image 12. No definite acute cortical infarction.  IMPRESSION: Limited study by motion artifacts. Mucosal thickening with partial opacification right maxillary sinus. Stable atrophy and extensive chronic white matter disease. Old appearing infarct in right PCA territory. No definite acute cortical infarction.   Electronically Signed   By: Natasha Mead M.D.   On: 08/26/2014 16:35   Dg Chest Port 1 View  08/26/2014   CLINICAL DATA:  Confusion  EXAM: PORTABLE CHEST - 1 VIEW  COMPARISON:  12/18/2013  FINDINGS: Cardiomegaly is noted. Central mild vascular congestion without pulmonary edema. Small bilateral pleural effusion with hazy bilateral basilar atelectasis or infiltrate.  IMPRESSION: No pulmonary edema. Small bilateral pleural effusion with hazy bilateral basilar atelectasis or infiltrate.   Electronically Signed   By: Natasha Mead M.D.   On: 08/26/2014 18:28    Scheduled Meds: . dabigatran  150 mg Oral BID  . furosemide  60 mg Oral Daily   . latanoprost  1 drop Both Eyes QHS  . losartan  50 mg Oral BID  . metoprolol succinate  25 mg Oral BID  . pantoprazole  40 mg Oral Q0600  . potassium chloride  40 mEq Oral BID  . pravastatin  20 mg Oral QHS  . sodium chloride  3 mL Intravenous Q12H   Continuous Infusions:   Principal Problem:   Ventricular fibrillation Active Problems:   Hypokalemia   Congestive heart failure   Atrial fibrillation   Glaucoma   Hypertension   Hyperlipidemia   Acute encephalopathy   Cardiac arrest    Time spent: 40 minutes    Aidenn Skellenger M.D. [facility] Pager [phone]. If 7PM-7AM, please contact night-coverage at [email or web address], password [facility] 08/27/2014, 12:37 PM  LOS: 2 days

## 2014-08-27 NOTE — Progress Notes (Signed)
2 nurses and 2 attempts to in and out cath without success.[facility] Teva Bronkema [employer]

## 2014-08-28 ENCOUNTER — Inpatient Hospital Stay (HOSPITAL_COMMUNITY): Payer: Medicare Other

## 2014-08-28 ENCOUNTER — Ambulatory Visit: Payer: PRIVATE HEALTH INSURANCE | Admitting: Nurse Practitioner

## 2014-08-28 LAB — BASIC METABOLIC PANEL
Anion gap: 3 — ABNORMAL LOW (ref 5–15)
BUN: 18 mg/dL (ref 6–23)
CO2: 37 mmol/L — AB (ref 19–32)
CREATININE: 1.16 mg/dL — AB (ref 0.50–1.10)
Calcium: 8.6 mg/dL (ref 8.4–10.5)
Chloride: 102 mmol/L (ref 96–112)
GFR, EST AFRICAN AMERICAN: 49 mL/min — AB (ref >90)
GFR, EST NON AFRICAN AMERICAN: 42 mL/min — AB (ref >90)
Glucose, Bld: 93 mg/dL (ref 70–99)
Potassium: 4.1 mmol/L (ref 3.5–5.1)
Sodium: 142 mmol/L (ref 135–145)

## 2014-08-28 LAB — CBC
HCT: 36.3 % (ref 36.0–46.0)
HEMOGLOBIN: 11.7 g/dL — AB (ref 12.0–15.0)
MCH: 31.5 pg (ref 26.0–34.0)
MCHC: 32.2 g/dL (ref 30.0–36.0)
MCV: 97.8 fL (ref 78.0–100.0)
Platelets: 148 10*3/uL — ABNORMAL LOW (ref 150–400)
RBC: 3.71 MIL/uL — AB (ref 3.87–5.11)
RDW: 15.3 % (ref 11.5–15.5)
WBC: 11.7 10*3/uL — ABNORMAL HIGH (ref 4.0–10.5)

## 2014-08-28 LAB — BRAIN NATRIURETIC PEPTIDE: B Natriuretic Peptide: 1594.9 pg/mL — ABNORMAL HIGH (ref 0.0–100.0)

## 2014-08-28 NOTE — Care Management Note (Addendum)
    Page 1 of 2   08/30/2014     5:00:35 PM CARE MANAGEMENT NOTE 08/30/2014  Patient:  Sydney Hensley,Sydney Hensley   Account Number:  [id number]  Date Initiated:  08/28/2014  Documentation initiated by:  GRAVES-BIGELOW,Joelly Bolanos  Subjective/Objective Assessment:   Pt admitted for Vfib arrest.  Pt has assistance from [place] Niece [name] [phone]. Per Nephew family may be able to assist with 24 hr supervision.  Pt states she has used [facility] in the past.     Action/Plan:   [facility] to be delivered to room before d/c. SOC to begin within 24-48 hrs post d/c. CM did ask CSW to speak with Niece in regards to SNF option. Pt will need HH orders if continues to be plan of care.   Anticipated DC Date:  08/29/2014   Anticipated DC Plan:  HOME W HOME HEALTH SERVICES  In-house referral  Clinical Social Worker      DC Planning Services  CM consult      [facility]  [facility]   Choice offered to / List presented to:  C-1 Patient   DME arranged  [facility]      DME agency  [facility]     [facility]  [facility] RN  HH-10 DISEASE MANAGEMENT  HH-2 PT  HH-3 OT  HH-4 NURSE'S AIDE  HH-6 SOCIAL WORKER      HH agency  [facility]   Status of service:  Completed, signed off Medicare Important Message given?  YES (If response is "NO", the following Medicare IM given date fields will be blank) Date Medicare IM given:  08/28/2014 Medicare IM given by:  GRAVES-BIGELOW,Caleen Taaffe Date Additional Medicare IM given:   Additional Medicare IM given by:    Discharge Disposition:  HOME W HOME HEALTH SERVICES  Per UR Regulation:  Reviewed for med. necessity/level of care/duration of stay  If discussed at Long Length of Stay Meetings, dates discussed:   08/31/2014    Comments:   08-30-14 1659 Tomi BambergerBrenda graves-Bigelow, RN,BSN [phone] Per CSW Lupita LeashDonna pt is now refusing snf and plan will be [facility]. No  further needs from CM at this time.  08-30-14 1637 Tomi BambergerBrenda Graves-Bigelow, [place]RN,BSN [phone] Plan for d/c to SNF in am. CSW to assist with disposition needs.   08-29-14 1129 Tomi BambergerBrenda Graves-Bigelow, RN,BSN [phone] [facility] and SOC to begin within 24-48 hours post d/c. CM to monitor for 02 needs. No further needs from CM at this time.

## 2014-08-28 NOTE — Progress Notes (Signed)
[facility] PROGRESS NOTE  Sydney Hensley ZOX:[id number] DOB: 12/13/1927 DOA: 08/25/2014 PCP: Ezequiel KayserPERINI,MARK A, MD  Assessment/Plan: #1 ventricular fibrillation cardiac arrest Secondary to electrolyte abnormalities of hypokalemia. Status post defibrillation and CPR. Patient subsequently went into bradycardia with PEA and given atropine. Cardiac enzymes minimally elevated however seem to a plateaued. Patient's beta blocker was held, however has been resumed per cardiology. 2-D echo with EF of 35-40% with diffuse hypokinesis and severely dilated left atrium, right ventricle, right atrium. Cardiology following.  #2 hypokalemia Likely secondary to diuretics. Magnesium level at 2. Repleted.  #3 acute encephalopathy Patient with improvement with her confusion. Likely secondary to post arrest. CT head was negative for any acute abnormalities. Urinalysis negative. Chest x-ray negative for any infiltrate.   #4 chronic atrial fibrillation Continue Beta blocker for rate control. Pradaxa for anticoagulation.  #5 acute on chronic diastolic heart failure Cardiac enzymes minimally elevated but have plateaued. 2-D echo with EF = 35-40% with diffuse hypokinesis. Severely dilated LA, RV, RA. 2-D echo was significant change from prior echo. Severe RHF. I/O = -750. Wt 62.914kg. BNP trending down. IV Lasix had been discontinued by cardiology. Cardiology following.   #6  Dark stools Patient's hemoglobin has pretty much been stable with no significant drop. FOBT pending. H/H stable. Continue PPI. Follow H&H.  #7 hypertension Continue cozaar and beta blocker. Follow.  #8 prophylaxis PPI for GI prophylaxis. Pradaxa for anticoagulation.  Code Status: DO NOT RESUSCITATE Family Communication: Updated nephew and niece and patient at bedside. Disposition Plan: Home when medically stable.   Consultants:  Cardiology: Dr. Shirlee LatchMcLean 08/26/2014  Procedures:  CT head 08/26/2014  2-D echo 08/27/2014    Antibiotics:  None  HPI/Subjective: Patient alert and oriented to self place and time. Mentation improved. Patient denies any SOB. No CP.  Objective: Filed Vitals:   08/28/14 0800  BP:   Pulse: 65  Temp:   Resp: 14    Intake/Output Summary (Last 24 hours) at 08/28/14 1116 Last data filed at 08/28/14 0550  Gross per 24 hour  Intake    360 ml  Output   1350 ml  Net   -990 ml   Filed Weights   08/25/14 1358 08/27/14 0533 08/28/14 0600  Weight: 63.504 kg (140 lb) 63.322 kg (139 lb 9.6 oz) 62.914 kg (138 lb 11.2 oz)    Exam:   General:  Alert and following commands.  Cardiovascular: Regular rate and rhythm no murmurs rubs or gallops  Respiratory: Clear to auscultation bilaterally anterior lung fields  Abdomen: Soft, nontender, nondistended, positive bowel sounds.  Musculoskeletal: No clubbing cyanosis or edema.   Data Reviewed: Basic Metabolic Panel:  Recent Labs Lab 08/25/14 1410  08/26/14 0114 08/26/14 0758 08/26/14 1258 08/27/14 0648 08/28/14 0545  NA 137  < > 137 139 139 142 142  K 2.8*  < > 2.8* 3.4* 3.9 4.2 4.1  CL 90*  < > 91* 96 97 101 102  CO2 39*  < > 28 38* 28 27 37*  GLUCOSE 129*  < > 160* 151* 170* 130* 93  BUN 10  < > 9 8 10 15 18   CREATININE 1.11*  < > 1.09 1.14* 1.10 1.21* 1.16*  CALCIUM 8.8  < > 8.3* 8.6 8.8 8.9 8.6  MG 1.7  --   --  2.0  --  2.2  --   < > = values in this interval not displayed. Liver Function Tests:  Recent Labs Lab 08/25/14 1410  AST 24  ALT 13  ALKPHOS 41  BILITOT 1.2  PROT 5.6*  ALBUMIN 2.9*   No results for input(s): LIPASE, AMYLASE in the last 168 hours. No results for input(s): AMMONIA in the last 168 hours. CBC:  Recent Labs Lab 08/25/14 1412 08/25/14 1513 08/26/14 0758 08/27/14 0648 08/28/14 0545  WBC 7.8  --  10.9* 14.6* 11.7*  HGB 11.9* 13.9 11.9* 12.6 11.7*  HCT 37.0 41.0 37.4 39.4 36.3  MCV 93.9  --  94.4 96.8 97.8  PLT 191  --  206 153 148*   Cardiac Enzymes:  Recent Labs Lab  08/26/14 0758 08/26/14 1258 08/26/14 2000  TROPONINI 0.06* 0.06* 0.07*   BNP (last 3 results)  Recent Labs  08/27/14 0648 08/28/14 0545  BNP 3485.6* 1594.9*    ProBNP (last 3 results) No results for input(s): PROBNP in the last 8760 hours.  CBG: No results for input(s): GLUCAP in the last 168 hours.  Recent Results (from the past 240 hour(s))  MRSA PCR Screening     Status: None   Collection Time: 08/25/14  5:04 PM  Result Value Ref Range Status   MRSA by PCR NEGATIVE NEGATIVE Final    Comment:        The GeneXpert MRSA Assay (FDA approved for NASAL specimens only), is one component of a comprehensive MRSA colonization surveillance program. It is not intended to diagnose MRSA infection nor to guide or monitor treatment for MRSA infections.      Studies: Ct Head Wo Contrast  08/26/2014   CLINICAL DATA:  Confusion  EXAM: CT HEAD WITHOUT CONTRAST  TECHNIQUE: Contiguous axial images were obtained from the base of the skull through the vertex without intravenous contrast.  COMPARISON:  03/08/2014 and 12/18/2013  FINDINGS: Study is limited by motion artifacts. There is mucosal thickening with partial opacification right maxillary sinus. No intracranial hemorrhage, mass effect or midline shift. Stable atrophy and extensive chronic white matter disease. There is old appearing infarct in right PCA territory see axial image 12. No definite acute cortical infarction.  IMPRESSION: Limited study by motion artifacts. Mucosal thickening with partial opacification right maxillary sinus. Stable atrophy and extensive chronic white matter disease. Old appearing infarct in right PCA territory. No definite acute cortical infarction.   Electronically Signed   By: Natasha Mead M.D.   On: 08/26/2014 16:35   Dg Chest Port 1 View  08/26/2014   CLINICAL DATA:  Confusion  EXAM: PORTABLE CHEST - 1 VIEW  COMPARISON:  12/18/2013  FINDINGS: Cardiomegaly is noted. Central mild vascular congestion without  pulmonary edema. Small bilateral pleural effusion with hazy bilateral basilar atelectasis or infiltrate.  IMPRESSION: No pulmonary edema. Small bilateral pleural effusion with hazy bilateral basilar atelectasis or infiltrate.   Electronically Signed   By: Natasha Mead M.D.   On: 08/26/2014 18:28    Scheduled Meds: . brimonidine  1 drop Both Eyes Q12H   And  . timolol  1 drop Both Eyes Q12H  . dabigatran  150 mg Oral BID  . dorzolamide  1 drop Right Eye BID  . escitalopram  10 mg Oral QHS  . furosemide  60 mg Intravenous BID  . latanoprost  1 drop Both Eyes QHS  . losartan  50 mg Oral BID  . metoprolol succinate  25 mg Oral BID  . pantoprazole  40 mg Oral Q0600  . potassium chloride  40 mEq Oral BID  . pravastatin  20 mg Oral QHS  . sodium chloride  3 mL Intravenous Q12H   Continuous  Infusions:   Principal Problem:   Ventricular fibrillation Active Problems:   Hypokalemia   Acute systolic congestive heart failure   Congestive heart failure   Atrial fibrillation   Glaucoma   Hypertension   Acute exacerbation of CHF (congestive heart failure)   Hyperlipidemia   Acute encephalopathy   Cardiac arrest    Time spent: 40 minutes    Lateka Rady M.D. [facility] Pager [phone]. If 7PM-7AM, please contact night-coverage at [email or web address], password [facility] 08/28/2014, 11:16 AM  LOS: 3 days

## 2014-08-28 NOTE — Progress Notes (Signed)
CSW Proofreader(Clinical Social Worker) spoke with pt and confirmed that she lives alone and has support from niece and nephew. Pt made aware of SNF recommendation and she is refusing at this time. CSW did explain difference between SNF and home health. Pt understanding but stated she would still prefer to dc home with assist from family. RNCM aware. At this time, pt has no further hospital social work need. CSW signing off.  Nanako Stopher, LCSWA [phone]

## 2014-08-28 NOTE — Plan of Care (Signed)
Problem: Phase II Progression Outcomes Goal: Tolerating diet Outcome: Not Progressing Patient is only consuming bites of each meal. This is normal for patient according to family; pt could benefit from supplemental nutrition. Will continue to monitor.

## 2014-08-28 NOTE — Progress Notes (Signed)
Subjective: No CP or SOB  Objective: Vital signs in last 24 hours: Temp:  [97.1 F (36.2 C)-97.8 F (36.6 C)] 97.6 F (36.4 C) (04/11 0400) Pulse Rate:  [65-85] 65 (04/11 0400) Resp:  [15-32] 15 (04/11 0400) BP: (132-196)/(75-120) 132/75 mmHg (04/11 0400) SpO2:  [90 %-98 %] 95 % (04/11 0618) Weight:  [138 lb 11.2 oz (62.914 kg)] 138 lb 11.2 oz (62.914 kg) (04/11 0600) Last BM Date: 08/26/14 (per previous documentation, pt* unable to remember )  Intake/Output from previous day: 04/10 0701 - 04/11 0700 In: 360 [P.O.:360] Out: 1350 [Urine:1350] Intake/Output this shift:    Medications Current Facility-Administered Medications  Medication Dose Route Frequency Provider Last Rate Last Dose  . brimonidine (ALPHAGAN) 0.2 % ophthalmic solution 1 drop  1 drop Both Eyes Q12H Rodolph Bonganiel Thompson V, MD   1 drop at 08/28/14 0939   And  . timolol (TIMOPTIC) 0.5 % ophthalmic solution 1 drop  1 drop Both Eyes Q12H Rodolph Bonganiel Thompson V, MD   1 drop at 08/28/14 [phone]  . dabigatran (PRADAXA) capsule 150 mg  150 mg Oral BID Leatha Gildingostin M Gherghe, MD   150 mg at 08/28/14 0939  . dorzolamide (TRUSOPT) 2 % ophthalmic solution 1 drop  1 drop Right Eye BID Rodolph Bonganiel Thompson V, MD   1 drop at 08/28/14 [phone]  . escitalopram (LEXAPRO) tablet 10 mg  10 mg Oral QHS Rodolph Bonganiel Thompson V, MD   10 mg at 08/27/14 2128  . furosemide (LASIX) injection 60 mg  60 mg Intravenous BID Rodolph Bonganiel Thompson V, MD   60 mg at 08/28/14 0940  . hydrALAZINE (APRESOLINE) injection 5 mg  5 mg Intravenous Q6H PRN Rodolph Bonganiel Thompson V, MD   5 mg at 08/27/14 1220  . latanoprost (XALATAN) 0.005 % ophthalmic solution 1 drop  1 drop Both Eyes QHS Leatha Gildingostin M Gherghe, MD   1 drop at 08/27/14 2125  . losartan (COZAAR) tablet 50 mg  50 mg Oral BID Rodolph Bonganiel Thompson V, MD   50 mg at 08/28/14 [id number]  . metoprolol succinate (TOPROL-XL) 24 hr tablet 25 mg  25 mg Oral BID Laurey Moralealton S McLean, MD   25 mg at 08/28/14 0933  . ondansetron (ZOFRAN) injection 4 mg  4 mg  Intravenous Q6H PRN Jinger NeighborsMary A Lynch, NP   4 mg at 08/26/14 1605  . pantoprazole (PROTONIX) EC tablet 40 mg  40 mg Oral Q0600 Rodolph Bonganiel Thompson V, MD   40 mg at 08/28/14 0549  . potassium chloride SA (K-DUR,KLOR-CON) CR tablet 40 mEq  40 mEq Oral BID Laurey Moralealton S McLean, MD   40 mEq at 08/28/14 0933  . pravastatin (PRAVACHOL) tablet 20 mg  20 mg Oral QHS Leatha Gildingostin M Gherghe, MD   20 mg at 08/27/14 2128  . sodium chloride 0.9 % injection 3 mL  3 mL Intravenous Q12H Costin Otelia SergeantM Gherghe, MD   3 mL at 08/28/14 0934    PE: General appearance: alert, cooperative and no distress Neck: no JVD Lungs: Decreased BS in the left base.  Heart: irregularly irregular rhythm and 1/6 sys mm Abdomen: +BS soft nontender Extremities: No KLEE Pulses: 2+ and symmetric Skin: Warm and dry.  poor skin turgor.  Neurologic: Grossly normal,generalized weakness  Lab Results:   Recent Labs  08/26/14 0758 08/27/14 0648 08/28/14 0545  WBC 10.9* 14.6* 11.7*  HGB 11.9* 12.6 11.7*  HCT 37.4 39.4 36.3  PLT 206 153 148*   BMET  Recent Labs  08/26/14 1258 08/27/14 0648 08/28/14 0545  NA 139 142 142  K 3.9 4.2 4.1  CL 97 101 102  CO2 28 27 37*  GLUCOSE 170* 130* 93  BUN CREATININE 1.10 1.21* 1.16*  CALCIUM 8.8 8.9 8.6    Assessment/Plan  Principal Problem:   Ventricular fibrillation Active Problems:   Congestive heart failure   Atrial fibrillation   Glaucoma   Hypertension   Acute exacerbation of CHF (congestive heart failure)   Hyperlipidemia   Hypokalemia   Acute encephalopathy   Cardiac arrest   Acute systolic congestive heart failure  Afib: Chronic, continue pradaza and beta blocker.  Rate well controlled.  Brady at times.   Vfib:  Arrest see note by Dr Shirlee Latch no aggressive evaluation.  Would not consider further w/u unless EF severely depressed. In setting of severe hypokalemia(2.4) Will Need K suppl if d/c with diuretic.  Echo:  Ef 35-40% with diffuse hypokinesis.  LA, RA, RV: severe  dilation.  Big difference from prior echo:  EF 60-65% in 2014 with mild LVH. RV mild dilation and RA moderate dilation.  ? Tachycardia mediated.  Her nephew apparently is POA and does not want a heart cath.    Confusion: ? Anoxic post arrest CT negative for CVA  Acute on chronic sys HF BNP yesterday 3485.6.  IV lasix given yesterday with net fluids:  -1.0L  BNP today 1594. SCr stable.  She looks euvolemic.  Absent BS left base.  Repeat CXR.  Stop lasix for now.  Monitor I/O's.  Will need daily lasix at DC.      LOS: 3 days    HAGER, BRYAN PA-C 08/28/2014 10:45 AM  Findings as noted above.  The nephew again reiterated that the family does not want a cardiac catheterization.  She denies any increasing dyspnea.  She has some chest discomfort on taking a deep breath.  She remains extremely weak.  She appears to be euvolemic today.  Jugular venous pressure is not increased.  She does have decreased breath sounds at the bases however.  We will get a follow-up chest x-ray today. Agree with above assessment and plan.

## 2014-08-28 NOTE — Progress Notes (Signed)
OT Cancellation Note  Patient Details Name: Sydney Hensley MRN: [id number] DOB: 01/10/1928   Cancelled Treatment:    Reason Eval/Treat Not Completed: Patient at procedure or test/ unavailable  Earlie RavelingStraub, [place] L OTR/L [phone] 08/28/2014, 4:11 PM

## 2014-08-29 DIAGNOSIS — I509 Heart failure, unspecified: Secondary | ICD-10-CM

## 2014-08-29 DIAGNOSIS — I5021 Acute systolic (congestive) heart failure: Secondary | ICD-10-CM

## 2014-08-29 DIAGNOSIS — R0902 Hypoxemia: Secondary | ICD-10-CM

## 2014-08-29 DIAGNOSIS — J90 Pleural effusion, not elsewhere classified: Secondary | ICD-10-CM | POA: Clinically undetermined

## 2014-08-29 DIAGNOSIS — E440 Moderate protein-calorie malnutrition: Secondary | ICD-10-CM | POA: Insufficient documentation

## 2014-08-29 DIAGNOSIS — J810 Acute pulmonary edema: Secondary | ICD-10-CM

## 2014-08-29 LAB — URINE CULTURE: Colony Count: >=100000

## 2014-08-29 LAB — CBC
HCT: 38.7 % (ref 36.0–46.0)
Hemoglobin: 12 g/dL (ref 12.0–15.0)
MCH: 30.3 pg (ref 26.0–34.0)
MCHC: 31 g/dL (ref 30.0–36.0)
MCV: 97.7 fL (ref 78.0–100.0)
PLATELETS: 156 10*3/uL (ref 150–400)
RBC: 3.96 MIL/uL (ref 3.87–5.11)
RDW: 14.7 % (ref 11.5–15.5)
WBC: 9.3 10*3/uL (ref 4.0–10.5)

## 2014-08-29 LAB — BASIC METABOLIC PANEL
Anion gap: 8 (ref 5–15)
BUN: 15 mg/dL (ref 6–23)
CO2: 36 mmol/L — AB (ref 19–32)
Calcium: 8.8 mg/dL (ref 8.4–10.5)
Chloride: 94 mmol/L — ABNORMAL LOW (ref 96–112)
Creatinine, Ser: 1.04 mg/dL (ref 0.50–1.10)
GFR calc Af Amer: 56 mL/min — ABNORMAL LOW (ref >90)
GFR calc non Af Amer: 48 mL/min — ABNORMAL LOW (ref >90)
GLUCOSE: 94 mg/dL (ref 70–99)
POTASSIUM: 4.7 mmol/L (ref 3.5–5.1)
Sodium: 138 mmol/L (ref 135–145)

## 2014-08-29 MED ORDER — FUROSEMIDE 10 MG/ML IJ SOLN
40.0000 mg | Freq: Two times a day (BID) | INTRAMUSCULAR | Status: AC
  Administered 2014-08-29 (×2): 40 mg via INTRAVENOUS
  Filled 2014-08-29 (×2): qty 4

## 2014-08-29 MED ORDER — LORAZEPAM 2 MG/ML IJ SOLN: 0.2500 mg | Freq: Two times a day (BID) | INTRAMUSCULAR | Status: DC | PRN

## 2014-08-29 NOTE — Progress Notes (Signed)
Patient Name: Sydney Hensley Date of Encounter: 08/29/2014     Principal Problem:   Ventricular fibrillation Active Problems:   Congestive heart failure   Atrial fibrillation   Glaucoma   Hypertension   Acute exacerbation of CHF (congestive heart failure)   Hyperlipidemia   Hypokalemia   Acute encephalopathy   Cardiac arrest   Acute systolic congestive heart failure    SUBJECTIVE  Patient remains very lethargic.  She denies any chest pain or increased dyspnea.  She appears to sleep most of the time. Rhythm remains atrial fibrillation with controlled ventricular response. Poor intake of food.  Weight is down 3 pounds since yesterday  CURRENT MEDS . brimonidine  1 drop Both Eyes Q12H   And  . timolol  1 drop Both Eyes Q12H  . dabigatran  150 mg Oral BID  . dorzolamide  1 drop Right Eye BID  . escitalopram  10 mg Oral QHS  . latanoprost  1 drop Both Eyes QHS  . losartan  50 mg Oral BID  . metoprolol succinate  25 mg Oral BID  . pantoprazole  40 mg Oral Q0600  . potassium chloride  40 mEq Oral BID  . pravastatin  20 mg Oral QHS  . sodium chloride  3 mL Intravenous Q12H    OBJECTIVE  Filed Vitals:   08/28/14 2024 08/28/14 2205 08/28/14 2343 08/29/14 0546  BP: 179/93 153/78  147/73  Pulse: 75 79  68  Temp: 98.6 F (37 C)  98.6 F (37 C) 97.7 F (36.5 C)  TempSrc: Oral  Oral Oral  Resp: 18   18  Height:      Weight:    135 lb 8 oz (61.462 kg)  SpO2: 99%   98%    Intake/Output Summary (Last 24 hours) at 08/29/14 1025 Last data filed at 08/28/14 2335  Gross per 24 hour  Intake    800 ml  Output    225 ml  Net    575 ml   Filed Weights   08/27/14 0533 08/28/14 0600 08/29/14 0546  Weight: 139 lb 9.6 oz (63.322 kg) 138 lb 11.2 oz (62.914 kg) 135 lb 8 oz (61.462 kg)    PHYSICAL EXAM  General: Pleasant, NAD. Marland Kitchen. HEENT:  Normal  Neck: Supple without bruits or JVD. Lungs:  Resp regular and unlabored, CTA.  Decreased breath sounds at bases.  Poor  inspiratory effort. Heart: Irregularly irregular no s3, s4, or murmurs. Abdomen: Soft, non-tender, non-distended, BS + x 4.  Extremities: No clubbing, cyanosis or edema.  Accessory Clinical Findings  CBC  Recent Labs  08/28/14 0545 08/29/14 0550  WBC 11.7* 9.3  HGB 11.7* 12.0  HCT 36.3 38.7  MCV 97.8 97.7  PLT 148* 156   Basic Metabolic Panel  Recent Labs  08/27/14 0648 08/28/14 0545 08/29/14 0550  NA 142 142 138  K 4.2 4.1 4.7  CL 101 102 94*  CO2 27 37* 36*  GLUCOSE 130* 93 94  BUN 15 18 15   CREATININE 1.21* 1.16* 1.04  CALCIUM 8.9 8.6 8.8  MG 2.2  --   --    Liver Function Tests No results for input(s): AST, ALT, ALKPHOS, BILITOT, PROT, ALBUMIN in the last 72 hours. No results for input(s): LIPASE, AMYLASE in the last 72 hours. Cardiac Enzymes  Recent Labs  08/26/14 1258 08/26/14 2000  TROPONINI 0.06* 0.07*   BNP Invalid input(s): POCBNP D-Dimer No results for input(s): DDIMER in the last 72 hours. Hemoglobin A1C No  results for input(s): HGBA1C in the last 72 hours. Fasting Lipid Panel No results for input(s): CHOL, HDL, LDLCALC, TRIG, CHOLHDL, LDLDIRECT in the last 72 hours. Thyroid Function Tests No results for input(s): TSH, T4TOTAL, T3FREE, THYROIDAB in the last 72 hours.  Invalid input(s): FREET3  TELE  Atrial fibrillation with controlled ventricular response  ECG    Radiology/Studies  Dg Chest 2 View  08/28/2014   CLINICAL DATA:  Cardiac arrest today.  EXAM: CHEST  2 VIEW  COMPARISON:  08/26/2014  FINDINGS: There is a moderate left pleural effusion and probable small right effusion. Bilateral perihilar and lower lobe opacities are noted. This could reflect edema or atelectasis. Heart is borderline in size. No pneumothorax. No acute bony abnormality.  IMPRESSION: Moderate left and small right pleural effusions. Heart is borderline in size with bilateral perihilar and lower lobe atelectasis or edema. Airspace opacities slightly worsened  since prior study.   Electronically Signed   By: Charlett Nose M.D.   On: 08/28/2014 16:33   Ct Head Wo Contrast  08/26/2014   CLINICAL DATA:  Confusion  EXAM: CT HEAD WITHOUT CONTRAST  TECHNIQUE: Contiguous axial images were obtained from the base of the skull through the vertex without intravenous contrast.  COMPARISON:  03/08/2014 and 12/18/2013  FINDINGS: Study is limited by motion artifacts. There is mucosal thickening with partial opacification right maxillary sinus. No intracranial hemorrhage, mass effect or midline shift. Stable atrophy and extensive chronic white matter disease. There is old appearing infarct in right PCA territory see axial image 12. No definite acute cortical infarction.  IMPRESSION: Limited study by motion artifacts. Mucosal thickening with partial opacification right maxillary sinus. Stable atrophy and extensive chronic white matter disease. Old appearing infarct in right PCA territory. No definite acute cortical infarction.   Electronically Signed   By: Natasha Mead M.D.   On: 08/26/2014 16:35   Dg Chest Port 1 View  08/26/2014   CLINICAL DATA:  Confusion  EXAM: PORTABLE CHEST - 1 VIEW  COMPARISON:  12/18/2013  FINDINGS: Cardiomegaly is noted. Central mild vascular congestion without pulmonary edema. Small bilateral pleural effusion with hazy bilateral basilar atelectasis or infiltrate.  IMPRESSION: No pulmonary edema. Small bilateral pleural effusion with hazy bilateral basilar atelectasis or infiltrate.   Electronically Signed   By: Natasha Mead M.D.   On: 08/26/2014 18:28    ASSESSMENT AND PLAN  Principal Problem:  Ventricular fibrillation Active Problems:  Congestive heart failure  Atrial fibrillation  Glaucoma  Hypertension  Acute exacerbation of CHF (congestive heart failure)  Hyperlipidemia  Hypokalemia  Acute encephalopathy  Cardiac arrest  Acute systolic congestive heart failure  Afib: Chronic, continue pradaza and beta blocker. Rate well  controlled. Brady at times.   Vfib:  Arrest see note by Dr Shirlee Latch no aggressive evaluation.  Would not consider further w/u unless EF severely depressed. In setting of severe hypokalemia(2.4) Will Need K suppl if d/c with diuretic. Echo: Ef 35-40% with diffuse hypokinesis. LA, RA, RV: severe dilation. Big difference from prior echo: EF 60-65% in 2014 with mild LVH. RV mild dilation and RA moderate dilation. ? Tachycardia mediated. Her nephew apparently is POA and does not want a heart cath.   Confusion: ? Anoxic post arrest CT negative for CVA  Acute on chronic sys HF She is clinically euvolemic.  She does have bilateral pleural effusions.  She is currently off diuretics and her weight is falling.  Failure to thrive. Family does not want any aggressive therapy.  She is  DO NOT RESUSCITATE  Signed, Cassell Clement MD

## 2014-08-29 NOTE — Consult Note (Signed)
Name: Sydney Hensley MRN: [id number] DOB: 1927/09/26    ADMISSION DATE:  08/25/2014 CONSULTATION DATE:  08/29/14  REFERRING MD :  Dr. Janee Morn   CHIEF COMPLAINT:  Pleural Effusion   BRIEF PATIENT DESCRIPTION: 81 y/o F with PMH of dCHF, AF on Pradaxa,  admitted 4/8 from PCP office for hypokalemia.  She became unresponsive in ER and was found to be in VF requiring ACLS.  She had return of normal mentation after episode.  Seen by Cardiology.  PCCM consulted 4/12 for bilateral pleural effusions.   SIGNIFICANT EVENTS  4/8 v-fib cardiac arrest in ED >> ROSC, admitted 4/10 2D Echo indicates cor pulmonale, right heart failure  STUDIES:  4/9 CT head w/o contrast >> Chronic white matter disease, no acute infarction 4/10  ECHO >> EF 35-40%, septal flattening c/w cor pulmonale, diffuse hypokinesis, trivial pericardial effusion, RA/RV severely dilated 4/11 CXR >> small to mod L, small R pleural effusion   HISTORY OF PRESENT ILLNESS:  History obtained from patient and medical record.    81 y/o F, remote smoker (in 20's), with PMH of glaucoma, HTN, AF on pradaxa, HLD, dCHF (followed by Dr. Antoine Poche, EF 35-40%) and depression who presented to [facility] ED on 4/8 after lab work at [employer] office indicated she had a potassium of 2.2 and a hemoglobin of 11.  Patient also complained of weakness and fatigue as well as dark, tarry stools for several months (which was under work up by PCP).  While in the ED, the patient became unresponsive and was found to have a VF arrest.  Return of spontaneous circulation was achieved with brief CPR and defibrillation.  She was admitted per [facility].  Potassium has been replaced and has stabilized, hemoglobin has remained stable without intervention.  Initial BNP was 3485 and subsequent of 1594 on 4/11.  Cardiology evaluated the patient for decompensated CHF & VF arrest.  She was diuresed with lasix.   ECHO was assessed and revealed cor pulmonale and right heart failure with an EF of 35-40%.   Prior ECHO in 2014 was notable for PA peak of 55.  Chest xray on 4/9 indicated small bilateral pleural effusions.  Repeat CXR on 4/11 indicated moderate left pleural effusion and small right pleural effusion with worsening airspace opactities. Patient remains in atrial fibrillation, on oxygen, 2L [place].  PCCM called for evaluation of pleural effusion.   At baseline, she lives independently.  She has never been married.  Cardiology office notes reflect difficulties with depression after her sister passed away and self medicating with ETOH (currently denies).  She has multiple bruises and has had a few falls at home.  She does have a bruise on her left face / eye but does not remember how they got there. She worked for [employer].  Denies known exposures.    PAST MEDICAL HISTORY :   has a past medical history of Glaucoma; Hypertension; Dysrhythmia; Atrial fibrillation; CHF (congestive heart failure); Blind right eye; High cholesterol; Pneumonia ("several times"); Cellulitis (X 2); Stroke; Depression; Dementia; and Ventricular fibrillation (08/25/2014 in ER).  has past surgical history that includes Abdominal hysterectomy (1950's).    Prior to Admission medications   Medication Sig Start Date End Date Taking? Authorizing Provider  acetaminophen (TYLENOL) 500 MG tablet Take 1,000 mg by mouth at bedtime.   Yes Historical Provider, MD  brimonidine-timolol (COMBIGAN) 0.2-0.5 % ophthalmic solution Place 1 drop into both eyes every 12 (twelve) hours.   Yes Historical Provider, MD  dabigatran (  PRADAXA) 150 MG CAPS Take 1 capsule (150 mg total) by mouth 2 (two) times daily. 04/26/12  Yes Rosalio MacadamiaLori C Gerhardt, NP  dorzolamide (TRUSOPT) 2 % ophthalmic solution Place 1 drop into the right eye 2 (two) times daily.   Yes Historical Provider, MD  escitalopram (LEXAPRO) 20 MG tablet Take 10 mg by mouth at bedtime.   Yes Historical Provider, MD  furosemide (LASIX) 40 MG tablet Take 1.5 tablets (60 mg total) by  mouth daily. 04/26/13  Yes Vassie Lollarlos Madera, MD  latanoprost (XALATAN) 0.005 % ophthalmic solution Place 1 drop into both eyes at bedtime.  01/21/13  Yes Historical Provider, MD  losartan (COZAAR) 50 MG tablet Take 1 tablet (50 mg total) by mouth 2 (two) times daily. 03/27/14  Yes Rollene RotundaJames Hochrein, MD  metoprolol tartrate (LOPRESSOR) 25 MG tablet Take 1 tablet (25 mg total) by mouth 2 (two) times daily. Patient taking differently: Take 50 mg by mouth 2 (two) times daily.  01/03/14  Yes Rollene RotundaJames Hochrein, MD  NONFORMULARY OR COMPOUNDED ITEM Apply 1 application topically 4 (four) times daily as needed (for back itching and irritations).   Yes Historical Provider, MD  pravastatin (PRAVACHOL) 20 MG tablet Take 20 mg by mouth at bedtime.   Yes Historical Provider, MD   Allergies  Allergen Reactions  . Lisinopril Cough    FAMILY HISTORY:  family history includes CVA in her mother; Cancer in her father.   SOCIAL HISTORY:  reports that she has quit smoking. Her smoking use included Cigarettes. She has never used smokeless tobacco. She reports that she does not drink alcohol or use illicit drugs.  REVIEW OF SYSTEMS:   Constitutional: Negative for fever, chills, weight loss, malaise/fatigue and diaphoresis.  HENT: Negative for hearing loss, ear pain, nosebleeds, congestion, sore throat, neck pain, tinnitus and ear discharge.   Eyes: Negative for blurred vision, double vision, photophobia, pain, discharge and redness.  Respiratory: Negative for cough, hemoptysis, sputum production, wheezing and stridor.  Reports mild shortness of breath Cardiovascular: Negative for chest pain, palpitations, orthopnea, claudication, and PND. Reports intermittent LE swelling Gastrointestinal: Negative for heartburn, nausea, vomiting, abdominal pain, diarrhea, constipation, blood in stool and melena.  Genitourinary: Negative for dysuria, urgency, frequency, hematuria and flank pain.  Musculoskeletal: Negative for myalgias, back  pain, joint pain and falls.  Skin: Negative for itching and rash.  Neurological: Negative for dizziness, tingling, tremors, sensory change, speech change, focal weakness, seizures, loss of consciousness, weakness and headaches.  Endo/Heme/Allergies: Negative for environmental allergies and polydipsia. Does not bruise/bleed easily.  SUBJECTIVE: Pt denies current complaints.    VITAL SIGNS: Temp:  [97.7 F (36.5 C)-98.6 F (37 C)] 97.7 F (36.5 C) (04/12 0546) Pulse Rate:  [68-79] 68 (04/12 0546) Resp:  [18] 18 (04/12 0546) BP: (132-179)/(73-93) 147/73 mmHg (04/12 0546) SpO2:  [98 %-99 %] 98 % (04/12 0546) Weight:  [135 lb 8 oz (61.462 kg)] 135 lb 8 oz (61.462 kg) (04/12 0546)  PHYSICAL EXAMINATION: General:  Frail elderly female in NAD Neuro:  AAOx4, speech clear, MAE HEENT:  MM pink/moist, no JVD, L eye/face swelling  Cardiovascular:  s1s2 irr irr, no m/r/g Lungs:  resp's even/non-labored, clear on R, diminished on L Abdomen: NTND, bsx4 active  Musculoskeletal:  No acute deformities  Skin:  Warm/dry, thin, multiple scattered bruises on extremities   Recent Labs Lab 08/27/14 0648 08/28/14 0545 08/29/14 0550  NA 142 142 138  K 4.2 4.1 4.7  CL 101 102 94*  CO2 27 37* 36*  BUN  CREATININE 1.21* 1.16* 1.04  GLUCOSE 130* 93 94    Recent Labs Lab 08/27/14 0648 08/28/14 0545 08/29/14 0550  HGB 12.6 11.7* 12.0  HCT 39.4 36.3 38.7  WBC 14.6* 11.7* 9.3  PLT 153 148* 156   Dg Chest 2 View  08/28/2014   CLINICAL DATA:  Cardiac arrest today.  EXAM: CHEST  2 VIEW  COMPARISON:  08/26/2014  FINDINGS: There is a moderate left pleural effusion and probable small right effusion. Bilateral perihilar and lower lobe opacities are noted. This could reflect edema or atelectasis. Heart is borderline in size. No pneumothorax. No acute bony abnormality.  IMPRESSION: Moderate left and small right pleural effusions. Heart is borderline in size with bilateral perihilar and lower lobe  atelectasis or edema. Airspace opacities slightly worsened since prior study.   Electronically Signed   By: Charlett Nose M.D.   On: 08/28/2014 16:33    ASSESSMENT / PLAN:   Bilateral Pleural Effusions - L>R small to moderate on L, mild increase since admit.  Suspect in setting of dCHF, VF arrest.  Does carry hx of unexplained bruises and falls, could have component of hemothorax with pradaxa.    PAH - prior ECHO in 2014 with PA peak of 55, ? Undiagnosed hypoxemia / OSA  Acute on Chronic CHF - EF 35-40%, cor pulmonale  Hypoxemia - pt found to be hypoxic on RA upon provider entering the room (83% on RA).  O2 applied with increase to 90's  Plan: Trend BNP Lasix 40 mg BID x2 with KCL Follow up BMP in am  Repeat CXR after lasix to evaluate effusions.  They are small to moderate in size and she is asymptomatic.  Given size of effusions, patients failure to thrive and current wishes for GOC, hold thoracentesis for now.  Consider [place] evaluation of pleural space  Patient will need assessment of oxygen needs prior to discharge.      Flat Affect / Depression / Geriatric FTT  Plan: Per primary SVC Consider PSY consult for depression, hx of ETOH abuse with sisters death PT consult per primary when cleared for activity  Canary Brim, NP-C [facility] Pgr: [phone] or [phone]  I reviewed CXR myself, bilateral L>R atelectasis.  Some pulmonary edema.  Problem List: - Pleural effusion. - PAH. - CHF. - Pulmonary edema. - Hypoxemia. - A-fib.  Recommendations: - Will check with U/S today regarding doing a thora. - Continued on pradaxa so no thora today. - Active diureses, lasix ordered. - CXR in AM. - May need treatment for her PAH but will not start inpatient, recommend outpatient f/u. - Titrate O2 for sats. - Ambulate. - IS and flutter valve. - Will come back and chest U/S.  Patient seen and examined, agree with above note.  I dictated the care and orders  written for this patient under my direction.  Alyson Reedy, MD [phone]  08/29/2014, 10:28 AM

## 2014-08-29 NOTE — Plan of Care (Signed)
Problem: Phase III Progression Outcomes Goal: Mental status at or near baseline Outcome: Progressing Patient has become increasingly confused/agitated since yesterday. Patient has very poor safety awareness, is pulling at lines, aggressive verbally, removing monitoring equipment, is forgetful of limitations and location/situation. MD aware. No further orders at this time. Will continue to monitor.

## 2014-08-29 NOTE — Evaluation (Addendum)
Occupational Therapy Evaluation Patient Details Name: Sydney Hensley [place] MRN: [id number] DOB: 09/27/1927 Today's Date: 08/29/2014    History of Present Illness Patient is an 81 yo female admitted 08/25/14 with severe hypokalemia. In ED, patient had V-fib arrest, now with confusion.  PMH:  Afib, HTN, CHF, falls   Clinical Impression   Pt admitted with above. Pt independent with ADLs, PTA. Feel pt will benefit from acute OT to increase independence, strength, and activity tolerance prior to d/c. Recommending HHOT only if pt has 24/7 assist at home. If not, recommending SNF (no family present in session, but according to notes, family declining SNF).    Follow Up Recommendations  Home health OT;Supervision/Assistance - 24 hour    Equipment Recommendations  3 in 1 bedside comode    Recommendations for Other Services       Precautions / Restrictions Precautions Precautions: Fall Precaution Comments: Falls at home. Restrictions Weight Bearing Restrictions: No      Mobility Bed Mobility  General bed mobility comments: not assessed  Transfers Overall transfer level: Needs assistance Equipment used: Rolling walker (2 wheeled) Transfers: Sit to/from Stand Sit to Stand: Min assist;Min guard         General transfer comment: instructed pt to scoot to edge of chair    Balance Min assist for ambulation with RW.                     ADL Overall ADL's : Needs assistance/impaired                     Lower Body Dressing: Minimal assistance;Moderate assistance; Sit to/from stand   Toilet Transfer: Minimal assistance;Ambulation;RW (chair)           Functional mobility during ADLs: Minimal assistance;Rolling walker General ADL Comments: Pt able to don/doff socks.      Vision     Perception     Praxis      Pertinent Vitals/Pain Pain Assessment: Faces Faces Pain Scale: Hurts little more Pain Location: legs, left chest area Pain Descriptors / Indicators:  Sore Pain Intervention(s): Limited activity within patient's tolerance;Repositioned   *RR up to 41 in session. Cues for deep breathing technique and it trended down. Pt on 1L of O2 in session.     Hand Dominance     Extremity/Trunk Assessment Upper Extremity Assessment Upper Extremity Assessment: Generalized weakness   Lower Extremity Assessment Lower Extremity Assessment: Defer to PT evaluation       Communication Communication Communication: No difficulties   Cognition Arousal/Alertness: Awake/alert Behavior During Therapy: WFL for tasks assessed/performed Overall Cognitive Status: No family/caregiver present to determine baseline cognitive functioning Area of Impairment: Memory;Orientation;Safety/judgement Orientation Level: Disoriented to;Time   Memory: Decreased short-term memory   Safety/Judgement: Decreased awareness of safety       General Comments       Exercises       Shoulder Instructions      Home Living Family/patient expects to be discharged to:: Private residence Living Arrangements: Alone Available Help at Discharge: Family (nephew lives nearby and spends time daily with patient) Type of Home: House Home Access: Stairs to enter Secretary/administratorntrance Stairs-Number of Steps: 1 Entrance Stairs-Rails: None Home Layout: One level               Home Equipment: Environmental consultantWalker - 2 wheels;Cane - single point;Toilet riser          Prior Functioning/Environment Level of Independence: Independent  OT Diagnosis: Generalized weakness;Cognitive deficits;Acute pain   OT Problem List: Decreased cognition;Decreased knowledge of use of DME or AE;Decreased knowledge of precautions;Pain;Decreased activity tolerance;Impaired balance (sitting and/or standing);Decreased strength;Decreased safety awareness   OT Treatment/Interventions: Self-care/ADL training;DME and/or AE instruction;Therapeutic activities;Cognitive remediation/compensation;Patient/family  education;Balance training;Therapeutic exercise;Energy conservation    OT Goals(Current goals can be found in the care plan section) Acute Rehab OT Goals Patient Stated Goal: go home OT Goal Formulation: With patient Time For Goal Achievement: 09/05/14 Potential to Achieve Goals: Good ADL Goals Pt Will Perform Grooming: with min guard assist;standing (3 tasks) Pt Will Perform Lower Body Bathing: sit to/from stand;with min guard assist Pt Will Perform Lower Body Dressing: with min guard assist;sit to/from stand Pt Will Transfer to Toilet: with min guard assist;ambulating Pt Will Perform Toileting - Clothing Manipulation and hygiene: with min guard assist;sit to/from stand  OT Frequency: Min 2X/week   Barriers to D/C:            Co-evaluation              End of Session Equipment Utilized During Treatment: Gait belt;Rolling walker; Oxygen Nurse Communication: Mobility status;Other (comment) (RR; pt in chair with alarm set)  Activity Tolerance: Patient limited by fatigue Patient left: in chair;with call bell/phone within reach;with chair alarm set   Time: 1610-9604 OT Time Calculation (min): 11 min Charges:  OT General Charges $OT Visit: 1 Procedure OT Evaluation $Initial OT Evaluation Tier I: 1 Procedure G-CodesEarlie Raveling OTR/L [phone] 08/29/2014, 2:53 PM

## 2014-08-29 NOTE — Progress Notes (Signed)
INITIAL NUTRITION ASSESSMENT  DOCUMENTATION CODES Per approved criteria  -Non-severe (moderate) malnutrition in the context of chronic illness  Pt meets criteria for moderate MALNUTRITION in the context of chronic illness as evidenced by  PO intake of <75% of estimated energy requirements for >/=1 month and moderate muscle and fat wasting.   INTERVENTION: Encourage PO  Provide snacks BID  NUTRITION DIAGNOSIS: Malnutrition related to chronic illness as evidenced by PO intake of <75% of estimated energy requirements for >/=1 month and moderate muscle and fat wasting.   Goal: Pt to meet >/=90% of her estimated nutrition needs  Monitor:  PO, labs, weight trends, snack acceptance  Reason for Assessment: Low Braden Score  81 y.o. female  Admitting Dx: Ventricular fibrillation  ASSESSMENT: Pt is a 81 y.o. female has a past medical history significant for hypertension, hyperlipidemia, history of CHF with preserved systolic function, atrial fibrillation, presents to the emergency room by the direction of her PCP for low potassium. According to family, she has been progressively less ambulatory, has been eating less and less and has had several probable falls the patient does not remember.  Pt reports not having good appetite in a while (pt could not be more specific due to poor memory). Per RN, she hardly eats her meals while here, however, according to family she always ate little and snacks through the day on Saltine crackers and Coke. Pt states that she is fine, does not need that much since she has been sedentary and refuses any supplements. Loves bananas, will order mid morning and mid afternoon snacks to help increase PO.  Labs reviewed: Cr 1.2, Glu 130  Nutrition Focused Physical Exam:  Subcutaneous Fat:  Orbital Region: n/a Upper Arm Region: moderate depletion Thoracic and Lumbar Region: n/a  Muscle:  Temple Region: moderate depletion Clavicle Bone Region: moderate  depletion Clavicle and Acromion Bone Region: moderate depletion Scapular Bone Region: mild to moderate depletion Dorsal Hand: moderate depletion Patellar Region: mild depletion Anterior Thigh Region: mild to moderate depletion Posterior Calf Region: mild to moderate depletion  Edema: none noted   Height: Ht Readings from Last 1 Encounters:  08/25/14  (1.651 m)    Weight: Wt Readings from Last 1 Encounters:  08/29/14 135 lb 8 oz (61.462 kg)    Ideal Body Weight: 125 Lb  % Ideal Body Weight: 108%  Wt Readings from Last 10 Encounters:  08/29/14 135 lb 8 oz (61.462 kg)  01/03/14 135 lb (61.236 kg)  12/18/13 141 lb 6.4 oz (64.139 kg)  12/12/13 141 lb (63.957 kg)  09/16/13 140 lb (63.504 kg)  04/26/13 129 lb 10.1 oz (58.8 kg)  02/01/13 129 lb (58.514 kg)  07/30/12 122 lb 12.8 oz (55.702 kg)  05/31/12 120 lb (54.432 kg)  04/30/12 125 lb 1.9 oz (56.754 kg)    Usual Body Weight: 130 - 140 Lb (per pt)  % Usual Body Weight: 100%  BMI:  Body mass index is 22.55 kg/(m^2).  Estimated Nutritional Needs: Kcal: 1500 - 1700 kcal Protein: 70 - 85g Fluid: >1.5L  Skin: WDL  Diet Order: Diet Heart Room service appropriate?: Yes; Fluid consistency:: Thin  EDUCATION NEEDS: -No education needs identified at this time   Intake/Output Summary (Last 24 hours) at 08/29/14 1002 Last data filed at 08/28/14 2335  Gross per 24 hour  Intake    800 ml  Output    225 ml  Net    575 ml    Last BM: 4/12   Labs:  Recent Labs Lab 08/25/14 1410  08/26/14 0758  08/27/14 0648 08/28/14 0545 08/29/14 0550  NA 137  < > 139  < > 142 142 138  K 2.8*  < > 3.4*  < > 4.2 4.1 4.7  CL 90*  < > 96  < > 101 102 94*  CO2 39*  < > 38*  < > 27 37* 36*  BUN 10  < > 8  < > 15 18 15   CREATININE 1.11*  < > 1.14*  < > 1.21* 1.16* 1.04  CALCIUM 8.8  < > 8.6  < > 8.9 8.6 8.8  MG 1.7  --  2.0  --  2.2  --   --   GLUCOSE 129*  < > 151*  < > 130* 93 94  < > = values in this interval not  displayed.  CBG (last 3)  No results for input(s): GLUCAP in the last 72 hours.  Scheduled Meds: . brimonidine  1 drop Both Eyes Q12H   And  . timolol  1 drop Both Eyes Q12H  . dabigatran  150 mg Oral BID  . dorzolamide  1 drop Right Eye BID  . escitalopram  10 mg Oral QHS  . latanoprost  1 drop Both Eyes QHS  . losartan  50 mg Oral BID  . metoprolol succinate  25 mg Oral BID  . pantoprazole  40 mg Oral Q0600  . potassium chloride  40 mEq Oral BID  . pravastatin  20 mg Oral QHS  . sodium chloride  3 mL Intravenous Q12H    Continuous Infusions:   Past Medical History  Diagnosis Date  . Glaucoma   . Hypertension   . Dysrhythmia   . Atrial fibrillation   . CHF (congestive heart failure)     Diastolic dysfunction  . Blind right eye   . High cholesterol   . Pneumonia "several times"  . Cellulitis X 2    "legs"  . Stroke     "peripheral vision in left eye only ater her strokes; major stroke 12/18/2013; small strokes since then" (08/25/2014)  . Depression   . Dementia     "since stroke in 12/2013"  . Ventricular fibrillation 08/25/2014 in ER    Past Surgical History  Procedure Laterality Date  . Abdominal hysterectomy  1950's    Garlen Reinig A. [facility] Dietetic Intern Pager: [phone] 08/29/2014 10:18 AM

## 2014-08-29 NOTE — Progress Notes (Signed)
Physical Therapy Treatment Patient Details Name: Sydney Hensley MRN: [id number] DOB: 07/07/1927 Today's Date: 08/29/2014    History of Present Illness Patient is an 81 yo female admitted 08/25/14 with severe hypokalemia. In ED, patient had V-fib arrest, now with confusion.  PMH:  Afib, HTN, CHF, falls    PT Comments    Pt making slow progress. Pt/family declining SNF. Pt will need 24 hour assist at home. Pt currently unable to perform mobility without assistance.  Follow Up Recommendations  Home health PT;Supervision/Assistance - 24 hour (pt/family declining SNF)     Equipment Recommendations  Other (comment) (received rollator)    Recommendations for Other Services       Precautions / Restrictions Precautions Precautions: Fall Precaution Comments: Falls at home.    Mobility  Bed Mobility Overal bed mobility: Needs Assistance Bed Mobility: Supine to Sit     Supine to sit: Mod assist     General bed mobility comments: Assist to bring trunk up and hips to EOB.  Transfers Overall transfer level: Needs assistance Equipment used: 4-wheeled walker Transfers: Sit to/from Stand Sit to Stand: +2 safety/equipment;Mod assist         General transfer comment: Verbal cues for hand placement and assist to bring hips up.  Ambulation/Gait Ambulation/Gait assistance: Min assist;+2 physical assistance Ambulation Distance (Feet): 20 Feet Assistive device: 4-wheeled walker Gait Pattern/deviations: Step-through pattern;Decreased step length - right;Decreased step length - left;Trunk flexed Gait velocity: decr Gait velocity interpretation: Below normal speed for age/gender General Gait Details: Verbal cues to stand more erect and assist for balance. Pt fatigues very quickly.   Stairs            Wheelchair Mobility    Modified Rankin (Stroke Patients Only)       Balance Overall balance assessment: Needs assistance Sitting-balance support: No upper extremity  supported;Feet supported Sitting balance-Leahy Scale: Fair     Standing balance support: Bilateral upper extremity supported Standing balance-Leahy Scale: Poor Standing balance comment: support of walker and min A                    Cognition Arousal/Alertness: Awake/alert Behavior During Therapy: WFL for tasks assessed/performed Overall Cognitive Status: Impaired/Different from baseline   Orientation Level: Disoriented to;Situation;Time   Memory: Decreased short-term memory   Safety/Judgement: Decreased awareness of safety   Problem Solving: Requires verbal cues      Exercises      General Comments        Pertinent Vitals/Pain Pain Assessment: Faces Faces Pain Scale: Hurts little more Pain Location: back Pain Descriptors / Indicators: Sore Pain Intervention(s): Limited activity within patient's tolerance;Repositioned    Home Living                      Prior Function            PT Goals (current goals can now be found in the care plan section) Progress towards PT goals: Progressing toward goals    Frequency  Min 3X/week    PT Plan Discharge plan needs to be updated    Co-evaluation             End of Session Equipment Utilized During Treatment: Gait belt;Oxygen Activity Tolerance: Patient limited by fatigue;No increased pain Patient left: in chair;with call bell/phone within reach;with chair alarm set     Time: [id number] PT Time Calculation (min) (ACUTE ONLY): 24 min  Charges:  $Gait Training: 23-37 mins  G Codes:      Shatyra Becka 08/29/2014, 1:04 PM  [employer] PT [phone]

## 2014-08-29 NOTE — Progress Notes (Signed)
[facility] PROGRESS NOTE  Sydney Hensley:[id number] DOB: February 21, 1928 DOA: 08/25/2014 PCP: Ezequiel Kayser, MD  Assessment/Plan: #1 ventricular fibrillation cardiac arrest Secondary to electrolyte abnormalities of hypokalemia. Status post defibrillation and CPR. Patient subsequently went into bradycardia with PEA and given atropine. Cardiac enzymes minimally elevated however seem to a plateaued. Patient's beta blocker was held, however has been resumed per cardiology. 2-D echo with EF of 35-40% with diffuse hypokinesis and severely dilated left atrium, right ventricle, right atrium. Patient's nephew who is POA does not want a cardiac catheterization. Family does not want aggressive therapy. Cardiology following.  #2 hypokalemia Likely secondary to diuretics. Magnesium level at 2. Repleted.  #3 acute encephalopathy Patient with improvement with her confusion. Likely secondary to post arrest. CT head was negative for any acute abnormalities. Urinalysis negative. Chest x-ray negative for any infiltrate. Patient may also have a component of sundowning. Follow.  #4 pleural effusions Noted on chest x-ray. Patient noted to be hypoxic on room air with sats dropping into the 70s. Chest x-ray with moderate sized left pleural effusion. Will have critical care medicine assess for possible therapeutic and diagnostic thoracentesis. Follow.  #5 chronic atrial fibrillation Continue Beta blocker for rate control. Pradaxa for anticoagulation.  #6 acute on chronic diastolic heart failure Cardiac enzymes minimally elevated but have plateaued. 2-D echo with EF = 35-40% with diffuse hypokinesis. Severely dilated LA, RV, RA. 2-D echo was significant change from prior echo. Severe RHF. I/O = 185 Wt 61.46 kg from 62.914kg. BNP trending down. IV Lasix had been discontinued by cardiology. Cardiology following.   #7  Dark stools Patient's hemoglobin has pretty much been stable with no significant drop. FOBT  pending. H/H stable. Continue PPI. Follow H&H.  #8 hypertension Continue cozaar and beta blocker. Follow.  #9 prophylaxis PPI for GI prophylaxis. Pradaxa for anticoagulation.  Code Status: DO NOT RESUSCITATE Family Communication: Updated nephew and patient at bedside. Disposition Plan: Home when medically stable, with home health.   Consultants:  Cardiology: Dr. Shirlee Latch 08/26/2014 PCCM pending  Procedures:  CT head 08/26/2014  2-D echo 08/27/2014 \  Chest x-ray 08/28/2014  Antibiotics:  None  HPI/Subjective: Patient alert and oriented to self place and time. Patient denies any SOB. No CP. Patient noted to desat into the 70s on room air per nursing.  Objective: Filed Vitals:   08/29/14 0546  BP: 147/73  Pulse: 68  Temp: 97.7 F (36.5 C)  Resp: 18    Intake/Output Summary (Last 24 hours) at 08/29/14 1843 Last data filed at 08/29/14 1403  Gross per 24 hour  Intake    440 ml  Output    225 ml  Net    215 ml   Filed Weights   08/27/14 0533 08/28/14 0600 08/29/14 0546  Weight: 63.322 kg (139 lb 9.6 oz) 62.914 kg (138 lb 11.2 oz) 61.462 kg (135 lb 8 oz)    Exam:   General:  Alert and following commands.  Cardiovascular: Regular rate and rhythm no murmurs rubs or gallops  Respiratory: Decreased breath sounds in the left base.  Abdomen: Soft, nontender, nondistended, positive bowel sounds.  Musculoskeletal: No clubbing cyanosis or edema.   Data Reviewed: Basic Metabolic Panel:  Recent Labs Lab 08/25/14 1410  08/26/14 0758 08/26/14 1258 08/27/14 0648 08/28/14 0545 08/29/14 0550  NA 137  < > 139 139 142 142 138  K 2.8*  < > 3.4* 3.9 4.2 4.1 4.7  CL 90*  < > 96 97 101 102 94*  CO2  39*  < > 38* 28 27 37* 36*  GLUCOSE 129*  < > 151* 170* 130* 93 94  BUN 10  < > 8 10 15 18 15   CREATININE 1.11*  < > 1.14* 1.10 1.21* 1.16* 1.04  CALCIUM 8.8  < > 8.6 8.8 8.9 8.6 8.8  MG 1.7  --  2.0  --  2.2  --   --   < > = values in this interval not  displayed. Liver Function Tests:  Recent Labs Lab 08/25/14 1410  AST 24  ALT 13  ALKPHOS 41  BILITOT 1.2  PROT 5.6*  ALBUMIN 2.9*   No results for input(s): LIPASE, AMYLASE in the last 168 hours. No results for input(s): AMMONIA in the last 168 hours. CBC:  Recent Labs Lab 08/25/14 1412 08/25/14 1513 08/26/14 0758 08/27/14 0648 08/28/14 0545 08/29/14 0550  WBC 7.8  --  10.9* 14.6* 11.7* 9.3  HGB 11.9* 13.9 11.9* 12.6 11.7* 12.0  HCT 37.0 41.0 37.4 39.4 36.3 38.7  MCV 93.9  --  94.4 96.8 97.8 97.7  PLT 191  --  206 153 148* 156   Cardiac Enzymes:  Recent Labs Lab 08/26/14 0758 08/26/14 1258 08/26/14 2000  TROPONINI 0.06* 0.06* 0.07*   BNP (last 3 results)  Recent Labs  08/27/14 0648 08/28/14 0545  BNP 3485.6* 1594.9*    ProBNP (last 3 results) No results for input(s): PROBNP in the last 8760 hours.  CBG: No results for input(s): GLUCAP in the last 168 hours.  Recent Results (from the past 240 hour(s))  MRSA PCR Screening     Status: None   Collection Time: 08/25/14  5:04 PM  Result Value Ref Range Status   MRSA by PCR NEGATIVE NEGATIVE Final    Comment:        The GeneXpert MRSA Assay (FDA approved for NASAL specimens only), is one component of a comprehensive MRSA colonization surveillance program. It is not intended to diagnose MRSA infection nor to guide or monitor treatment for MRSA infections.   Culture, Urine     Status: None   Collection Time: 08/27/14  7:30 PM  Result Value Ref Range Status   Specimen Description URINE, RANDOM  Final   Special Requests NONE  Final   Colony Count   Final    >=100,000 COLONIES/ML Performed at [facility]    Culture   Final    Multiple bacterial morphotypes present, none predominant. Suggest appropriate recollection if clinically indicated. Performed at [facility]    Report Status 08/29/2014 FINAL  Final     Studies: Dg Chest 2 View  08/28/2014   CLINICAL DATA:  Cardiac  arrest today.  EXAM: CHEST  2 VIEW  COMPARISON:  08/26/2014  FINDINGS: There is a moderate left pleural effusion and probable small right effusion. Bilateral perihilar and lower lobe opacities are noted. This could reflect edema or atelectasis. Heart is borderline in size. No pneumothorax. No acute bony abnormality.  IMPRESSION: Moderate left and small right pleural effusions. Heart is borderline in size with bilateral perihilar and lower lobe atelectasis or edema. Airspace opacities slightly worsened since prior study.   Electronically Signed   By: Charlett NoseKevin  Dover M.D.   On: 08/28/2014 16:33    Scheduled Meds: . brimonidine  1 drop Both Eyes Q12H   And  . timolol  1 drop Both Eyes Q12H  . dabigatran  150 mg Oral BID  . dorzolamide  1 drop Right Eye BID  . escitalopram  10 mg Oral QHS  . furosemide  40 mg Intravenous Q12H  . latanoprost  1 drop Both Eyes QHS  . losartan  50 mg Oral BID  . metoprolol succinate  25 mg Oral BID  . pantoprazole  40 mg Oral Q0600  . potassium chloride  40 mEq Oral BID  . pravastatin  20 mg Oral QHS  . sodium chloride  3 mL Intravenous Q12H   Continuous Infusions:   Principal Problem:   Ventricular fibrillation Active Problems:   Hypokalemia   Acute systolic congestive heart failure   Congestive heart failure   Atrial fibrillation   Glaucoma   Hypertension   Acute exacerbation of CHF (congestive heart failure)   Hyperlipidemia   Acute encephalopathy   Cardiac arrest   Malnutrition of moderate degree   Pleural effusion    Time spent: 40 minutes    THOMPSON,DANIEL M.D. [facility] Pager [phone]. If 7PM-7AM, please contact night-coverage at [email or web address], password [facility] 08/29/2014, 6:43 PM  LOS: 4 days

## 2014-08-30 DIAGNOSIS — J810 Acute pulmonary edema: Secondary | ICD-10-CM | POA: Insufficient documentation

## 2014-08-30 LAB — BASIC METABOLIC PANEL
ANION GAP: 7 (ref 5–15)
BUN: 17 mg/dL (ref 6–23)
CHLORIDE: 95 mmol/L — AB (ref 96–112)
CO2: 35 mmol/L — AB (ref 19–32)
Calcium: 9.2 mg/dL (ref 8.4–10.5)
Creatinine, Ser: 1.17 mg/dL — ABNORMAL HIGH (ref 0.50–1.10)
GFR calc non Af Amer: 42 mL/min — ABNORMAL LOW (ref >90)
GFR, EST AFRICAN AMERICAN: 48 mL/min — AB (ref >90)
Glucose, Bld: 112 mg/dL — ABNORMAL HIGH (ref 70–99)
POTASSIUM: 4.9 mmol/L (ref 3.5–5.1)
SODIUM: 137 mmol/L (ref 135–145)

## 2014-08-30 LAB — BRAIN NATRIURETIC PEPTIDE: B Natriuretic Peptide: 622.4 pg/mL — ABNORMAL HIGH (ref 0.0–100.0)

## 2014-08-30 LAB — CBC
HEMATOCRIT: 40.9 % (ref 36.0–46.0)
HEMOGLOBIN: 12.9 g/dL (ref 12.0–15.0)
MCH: 30.4 pg (ref 26.0–34.0)
MCHC: 31.5 g/dL (ref 30.0–36.0)
MCV: 96.5 fL (ref 78.0–100.0)
Platelets: 176 10*3/uL (ref 150–400)
RBC: 4.24 MIL/uL (ref 3.87–5.11)
RDW: 14.9 % (ref 11.5–15.5)
WBC: 10.1 10*3/uL (ref 4.0–10.5)

## 2014-08-30 MED ORDER — FUROSEMIDE 20 MG PO TABS
20.0000 mg | ORAL_TABLET | Freq: Every day | ORAL | Status: DC
  Administered 2014-08-30: 20 mg via ORAL
  Filled 2014-08-30: qty 1

## 2014-08-30 MED ORDER — CAMPHOR-MENTHOL 0.5-0.5 % EX LOTN
TOPICAL_LOTION | CUTANEOUS | Status: DC | PRN
  Administered 2014-08-30: 22:00:00 via TOPICAL
  Filled 2014-08-30: qty 222

## 2014-08-30 NOTE — Progress Notes (Addendum)
Subjective: No pain or SOB.  Laying flat.   Objective: Vital signs in last 24 hours: Temp:  [97.4 F (36.3 C)-97.8 F (36.6 C)] 97.8 F (36.6 C) (04/13 0433) Pulse Rate:  [59-71] 71 (04/13 0433) Resp:  [18] 18 (04/13 0840) BP: (83-141)/(63-98) 127/98 mmHg (04/13 0840) SpO2:  [94 %-100 %] 100 % (04/13 0433) Weight:  [135 lb 4.8 oz (61.372 kg)] 135 lb 4.8 oz (61.372 kg) (04/13 0433) Last BM Date: 08/29/14  Intake/Output from previous day: 04/12 0701 - 04/13 0700 In: 560 [P.O.:560] Out: 975 [Urine:975] Intake/Output this shift:    Medications Current Facility-Administered Medications  Medication Dose Route Frequency Provider Last Rate Last Dose  . brimonidine (ALPHAGAN) 0.2 % ophthalmic solution 1 drop  1 drop Both Eyes Q12H Ramiro Harvestaniel Thompson V, MD   1 drop at 08/29/14 2103   And  . timolol (TIMOPTIC) 0.5 % ophthalmic solution 1 drop  1 drop Both Eyes Q12H Rodolph Bonganiel Thompson V, MD   1 drop at 08/29/14 2115  . dabigatran (PRADAXA) capsule 150 mg  150 mg Oral BID Leatha Gildingostin M Gherghe, MD   150 mg at 08/29/14 2101  . dorzolamide (TRUSOPT) 2 % ophthalmic solution 1 drop  1 drop Right Eye BID Rodolph Bonganiel Thompson V, MD   1 drop at 08/29/14 2058  . escitalopram (LEXAPRO) tablet 10 mg  10 mg Oral QHS Rodolph Bonganiel Thompson V, MD   10 mg at 08/29/14 2101  . hydrALAZINE (APRESOLINE) injection 5 mg  5 mg Intravenous Q6H PRN Rodolph Bonganiel Thompson V, MD   5 mg at 08/27/14 1220  . latanoprost (XALATAN) 0.005 % ophthalmic solution 1 drop  1 drop Both Eyes QHS Leatha Gildingostin M Gherghe, MD   1 drop at 08/29/14 2109  . LORazepam (ATIVAN) injection 0.25 mg  0.25 mg Intravenous BID PRN Rodolph Bonganiel Thompson V, MD      . losartan (COZAAR) tablet 50 mg  50 mg Oral BID Rodolph Bonganiel Thompson V, MD   50 mg at 08/29/14 2101  . metoprolol succinate (TOPROL-XL) 24 hr tablet 25 mg  25 mg Oral BID Laurey Moralealton S McLean, MD   25 mg at 08/30/14 0843  . ondansetron (ZOFRAN) injection 4 mg  4 mg Intravenous Q6H PRN Jinger NeighborsMary A Lynch, NP   4 mg at 08/26/14 1605  .  pantoprazole (PROTONIX) EC tablet 40 mg  40 mg Oral Q0600 Rodolph Bonganiel Thompson V, MD   40 mg at 08/30/14 0610  . potassium chloride SA (K-DUR,KLOR-CON) CR tablet 40 mEq  40 mEq Oral BID Laurey Moralealton S McLean, MD   40 mEq at 08/30/14 0843  . pravastatin (PRAVACHOL) tablet 20 mg  20 mg Oral QHS Leatha Gildingostin M Gherghe, MD   20 mg at 08/29/14 2101  . sodium chloride 0.9 % injection 3 mL  3 mL Intravenous Q12H Leatha Gildingostin M Gherghe, MD   3 mL at 08/29/14 2101    PE: General appearance: alert, cooperative, no distress and Laying flat in the bed.  Normal respirations.  Lungs: clear to auscultation bilaterally Heart: irregularly irregular rhythm Abdomen: +BS, nontender Extremities: No LEE Pulses: 2+ and symmetric Skin: Warm and dry Neurologic: Grossly normal  Lab Results:   Recent Labs  08/28/14 0545 08/29/14 0550 08/30/14 0520  WBC 11.7* 9.3 10.1  HGB 11.7* 12.0 12.9  HCT 36.3 38.7 40.9  PLT 148* 156 176   BMET  Recent Labs  08/28/14 0545 08/29/14 0550 08/30/14 0520  NA 142 138 137  K 4.1 4.7 4.9  CL 102 94* 95*  CO2 37* 36* 35*  GLUCOSE 93 94 112*  BUN CREATININE 1.16* 1.04 1.17*  CALCIUM 8.6 8.8 9.2    Assessment/Plan   Principal Problem:   Ventricular fibrillation Active Problems:   Congestive heart failure   Atrial fibrillation   Glaucoma   Hypertension   Acute exacerbation of CHF (congestive heart failure)   Hyperlipidemia   Hypokalemia   Acute encephalopathy   Cardiac arrest   Acute systolic congestive heart failure   Malnutrition of moderate degree   Pleural effusion   Acute systolic heart failure  Afib: Chronic, continue pradaza and beta blocker. Rate well controlled. Brady at times.   Vfib:  Arrest see note by Dr Shirlee Latch no aggressive evaluation.  Would not consider further w/u unless EF severely depressed. In setting of severe hypokalemia(2.4) Will Need K suppl if d/c with diuretic. Echo: Ef 35-40% with diffuse hypokinesis. LA, RA, RV: severe  dilation. Big difference from prior echo: EF 60-65% in 2014 with mild LVH. RV mild dilation and RA moderate dilation. ? Tachycardia mediated. Her nephew apparently is POA and does not want a heart cath.   Confusion: ? Anoxic post arrest CT negative for CVA  Acute on chronic sys HF She is clinically euvolemic. She does have bilateral pleural effusions. She is currently off diuretics.  Her weight has been falling but stable today.  BP stable this morning..  Failure to thrive. Family does not want any aggressive therapy. She is DO NOT RESUSCITATE    LOS: 5 days    HAGER, BRYAN PA-C 08/30/2014 9:56 AM  The patient is more alert.  She is cooperative.  She is not dyspneic lying flat.  Rhythm is atrial fibrillation with controlled ventricular response.  She is on metoprolol and pradaxa. Her nurse today tells me that now the nephew is willing to consider short-term nursing home placement. Agree with assessment and plan as noted above.  We will sign off now.  Please call for questions

## 2014-08-30 NOTE — Progress Notes (Signed)
Name: Sydney Hensley MRN: [id number] DOB: 1927/06/06    ADMISSION DATE:  08/25/2014 CONSULTATION DATE:  08/29/14  REFERRING MD :  Dr. Janee Morn   CHIEF COMPLAINT:  Pleural Effusion   BRIEF PATIENT DESCRIPTION: 81 y/o F with PMH of dCHF, AF on Pradaxa,  admitted 4/8 from PCP office for hypokalemia.  She became unresponsive in ER and was found to be in VF requiring ACLS.  She had return of normal mentation after episode.  Seen by Cardiology.  PCCM consulted 4/12 for bilateral pleural effusions.   SIGNIFICANT EVENTS  4/8 v-fib cardiac arrest in ED >> ROSC, admitted 4/10 2D Echo indicates cor pulmonale, right heart failure  STUDIES:  4/9 CT head w/o contrast >> Chronic white matter disease, no acute infarction 4/10  ECHO >> EF 35-40%, septal flattening c/w cor pulmonale, diffuse hypokinesis, trivial pericardial effusion, RA/RV severely dilated 4/11 CXR >> small to mod L, small R pleural effusion   SUBJECTIVE:  RN reports improved mentation this am.  No acute events.    VITAL SIGNS: Temp:  [97.4 F (36.3 C)-97.8 F (36.6 C)] 97.8 F (36.6 C) (04/13 0433) Pulse Rate:  [59-71] 71 (04/13 0433) Resp:  [18] 18 (04/13 0840) BP: (83-141)/(63-98) 127/98 mmHg (04/13 0840) SpO2:  [94 %-100 %] 100 % (04/13 0433) Weight:  [135 lb 4.8 oz (61.372 kg)] 135 lb 4.8 oz (61.372 kg) (04/13 0433)  PHYSICAL EXAMINATION: General:  Frail elderly female in NAD Neuro:  Awake, alert, speech clear, MAE HEENT:  MM pink/moist, no JVD, L eye/face swelling  Cardiovascular:  s1s2 irr irr, no m/r/g Lungs:  resp's even/non-labored, clear on R, diminished on L Abdomen: NTND, bsx4 active  Musculoskeletal:  No acute deformities  Skin:  Warm/dry, thin, multiple scattered bruises on extremities   Recent Labs Lab 08/28/14 0545 08/29/14 0550 08/30/14 0520  NA 142 138 137  K 4.1 4.7 4.9  CL 102 94* 95*  CO2 37* 36* 35*  BUN CREATININE 1.16* 1.04 1.17*  GLUCOSE 93 94 112*    Recent Labs Lab  08/28/14 0545 08/29/14 0550 08/30/14 0520  HGB 11.7* 12.0 12.9  HCT 36.3 38.7 40.9  WBC 11.7* 9.3 10.1  PLT 148* 156 176   Dg Chest 2 View  08/28/2014   CLINICAL DATA:  Cardiac arrest today.  EXAM: CHEST  2 VIEW  COMPARISON:  08/26/2014  FINDINGS: There is a moderate left pleural effusion and probable small right effusion. Bilateral perihilar and lower lobe opacities are noted. This could reflect edema or atelectasis. Heart is borderline in size. No pneumothorax. No acute bony abnormality.  IMPRESSION: Moderate left and small right pleural effusions. Heart is borderline in size with bilateral perihilar and lower lobe atelectasis or edema. Airspace opacities slightly worsened since prior study.   Electronically Signed   By: Charlett Nose M.D.   On: 08/28/2014 16:33    ASSESSMENT / PLAN:   Bilateral Pleural Effusions - L>R small to moderate on L, mild increase since admit.  Suspect in setting of dCHF, VF arrest.  Does carry hx of unexplained bruises and falls, could have component of hemothorax with pradaxa.    PAH - prior ECHO in 2014 with PA peak of 55, ? Undiagnosed hypoxemia / OSA  Acute on Chronic CHF - EF 35-40%, cor pulmonale  Hypoxemia - pt found to be hypoxic on RA upon provider entering the room (83% on RA).  O2 applied with increase to 90's  Plan: Trend BNP (improved) Intermittent  CXR to monitor effusions  Effusions are small to moderate in size and she is asymptomatic.  Given size of effusions, patients failure to thrive and current wishes for GOC, hold thoracentesis for now.  US assessment does not support thoracentesis.  Patient will need assessment of oxygen needs prior to discharge.   IS   Flat Affect / Depression / Geriatric FTT  Plan: Per primary SVC Consider PSY consult for depression, hx of ETOH abuse with sisters death PT consult per primary when cleared for activity  PCCM will sign off, please call back if new needs arise.    Canary BrimBrandi Ollis, NP-C [facility] Pgr: [phone] or [phone]  Checked chest U/S, there is some fluid but not enough for a thoracentesis.  Improving rales noted.  Bilateral pleural effusion L>R due to CHF and VF arrest.  Patient on pradaxa, was held, restart pradaxa, no thora. PAH: PAP of 55 likely secondary to CHF, repeat echo. Hypoxemia: Due to CHF and PAH, supplemental O2, will need to arrange for home O2, titrate O2 down for sats. CHF: BNP improved, continue current treatment. Pulmonary edema: Lasix as ordered, CXR ordered.  Patient seen and examined, agree with above note.  I dictated the care and orders written for this patient under my direction.  Alyson ReedyWesam G Tuwanna Krausz, MD [phone]

## 2014-08-30 NOTE — Progress Notes (Addendum)
[facility] with [facility] the need for SNF for short term rehab.  Patient's nephew was contacted and stated that he did not want patient to go to a facility.  CSW discussed with patient and she also declined.  CSW spoke again with patient and discussed benefits of short term SNF and she has now agreed to this. CSW contacted nephew again and related that patient is now in agreement.  Thus, he will agree to SNF- stating "I was refusing because she would not agree and I didn't want to 'give up' on her."  Nephew agreed to sign admit papers at the facility- will be available tomorrow afternoon.  Active bed search will be initiated this afternoon for SNF bed.  Nephew is requesting either [facility].  Fl2 will be placed on chart for MD's signature. RNCM was notified originally that patient refused and has not been notified of change in d/c plan. Please disregard her note as of 5 pm.  Thanks!   Lorri Frederickonna T. Jaci LazierCrowder, KentuckyLCSW [phone]

## 2014-08-30 NOTE — Progress Notes (Signed)
[facility] PROGRESS NOTE  Sydney Hensley OZH:[id number] DOB: 06/23/1927 DOA: 08/25/2014 PCP: Ezequiel KayserPERINI,MARK A, MD Interim summary: 81 y.o. Female admitted for hypokalemia, s/p Vfib cardiac arrest.  Assessment/Plan: #1 ventricular fibrillation cardiac arrest Secondary to electrolyte abnormalities of hypokalemia. Status post defibrillation and CPR. Patient subsequently went into bradycardia with PEA and given atropine. Cardiac enzymes minimally elevated however seem to a plateaued. Patient's beta blocker was held, however has been resumed per cardiology. 2-D echo with EF of 35-40% with diffuse hypokinesis and severely dilated left atrium, right ventricle, right atrium. Patient's nephew who is POA does not want a cardiac catheterization. Family does not want aggressive therapy.   #2 hypokalemia Likely secondary to diuretics. Magnesium level at 2. Repleted.  #3 acute encephalopathy Patient with improvement with her confusion. Likely secondary to post arrest. CT head was negative for any acute abnormalities. Urinalysis negative. Chest x-ray negative for any infiltrate.  Her encephalopathy has resolved.   #4 pleural effusions Noted on chest x-ray. Patient noted to be hypoxic on room air with sats dropping into the 70s. Chest x-ray with moderate sized left pleural effusion.as per PCCM fluid not enough for thoracocentesis. Signed off. She required IV lasix yesterday.     #5 chronic atrial fibrillation Continue Beta blocker for rate control. Pradaxa for anticoagulation.  #6 acute on chronic diastolic heart failure Cardiac enzymes minimally elevated but have plateaued. 2-D echo with EF = 35-40% with diffuse hypokinesis. Severely dilated LA, RV, RA. 2-D echo was significant change from prior echo. Severe RHF. I/O = 185 Wt 61.46 kg from 62.914kg. BNP trending down. Required 2 doses of lasix yesterday. Start her on low dose of lasix 20 mg daily.   #7  Dark stools Patient's hemoglobin has pretty much  been stable with no significant drop. FOBT pending. H/H stable. Continue PPI. Follow H&H.  #8 hypertension Continue cozaar and beta blocker. Follow.  #9 prophylaxis PPI for GI prophylaxis. Pradaxa for anticoagulation.  Code Status: DO NOT RESUSCITATE Family Communication:called and spoke to her sister.  Disposition Plan: Home when medically stable, with home health.   Consultants:  Cardiology: Dr. Shirlee LatchMcLean 08/26/2014 PCCM pending  Procedures:  CT head 08/26/2014  2-D echo 08/27/2014 \  Chest x-ray 08/28/2014  Antibiotics:  None  HPI/Subjective: Patient alert and oriented to self place and time.  No chest pain or sob.   Objective: Filed Vitals:   08/30/14 1105  BP: 156/84  Pulse:   Temp:   Resp: 15    Intake/Output Summary (Last 24 hours) at 08/30/14 1613 Last data filed at 08/30/14 0640  Gross per 24 hour  Intake    320 ml  Output    975 ml  Net   -655 ml   Filed Weights   08/28/14 0600 08/29/14 0546 08/30/14 0433  Weight: 62.914 kg (138 lb 11.2 oz) 61.462 kg (135 lb 8 oz) 61.372 kg (135 lb 4.8 oz)    Exam:   General:  Alert and following commands.  Cardiovascular: Regular rate and rhythm no murmurs rubs or gallops  Respiratory: Decreased breath sounds in the left base.  Abdomen: Soft, nontender, nondistended, positive bowel sounds.  Musculoskeletal: No clubbing cyanosis or edema.   Data Reviewed: Basic Metabolic Panel:  Recent Labs Lab 08/25/14 1410  08/26/14 0758 08/26/14 1258 08/27/14 0648 08/28/14 0545 08/29/14 0550 08/30/14 0520  NA 137  < > 139 139 142 142 138 137  K 2.8*  < > 3.4* 3.9 4.2 4.1 4.7 4.9  CL 90*  < >  96 97 101 102 94* 95*  CO2 39*  < > 38* 28 27 37* 36* 35*  GLUCOSE 129*  < > 151* 170* 130* 93 94 112*  BUN 10  < > CREATININE 1.11*  < > 1.14* 1.10 1.21* 1.16* 1.04 1.17*  CALCIUM 8.8  < > 8.6 8.8 8.9 8.6 8.8 9.2  MG 1.7  --  2.0  --  2.2  --   --   --   < > = values in this interval not  displayed. Liver Function Tests:  Recent Labs Lab 08/25/14 1410  AST 24  ALT 13  ALKPHOS 41  BILITOT 1.2  PROT 5.6*  ALBUMIN 2.9*   No results for input(s): LIPASE, AMYLASE in the last 168 hours. No results for input(s): AMMONIA in the last 168 hours. CBC:  Recent Labs Lab 08/26/14 0758 08/27/14 0648 08/28/14 0545 08/29/14 0550 08/30/14 0520  WBC 10.9* 14.6* 11.7* 9.3 10.1  HGB 11.9* 12.6 11.7* 12.0 12.9  HCT 37.4 39.4 36.3 38.7 40.9  MCV 94.4 96.8 97.8 97.7 96.5  PLT 206 153 148* 156 176   Cardiac Enzymes:  Recent Labs Lab 08/26/14 0758 08/26/14 1258 08/26/14 2000  TROPONINI 0.06* 0.06* 0.07*   BNP (last 3 results)  Recent Labs  08/27/14 0648 08/28/14 0545 08/30/14 0505  BNP 3485.6* 1594.9* 622.4*    ProBNP (last 3 results) No results for input(s): PROBNP in the last 8760 hours.  CBG: No results for input(s): GLUCAP in the last 168 hours.  Recent Results (from the past 240 hour(s))  MRSA PCR Screening     Status: None   Collection Time: 08/25/14  5:04 PM  Result Value Ref Range Status   MRSA by PCR NEGATIVE NEGATIVE Final    Comment:        The GeneXpert MRSA Assay (FDA approved for NASAL specimens only), is one component of a comprehensive MRSA colonization surveillance program. It is not intended to diagnose MRSA infection nor to guide or monitor treatment for MRSA infections.   Culture, Urine     Status: None   Collection Time: 08/27/14  7:30 PM  Result Value Ref Range Status   Specimen Description URINE, RANDOM  Final   Special Requests NONE  Final   Colony Count   Final    >=100,000 COLONIES/ML Performed at [facility]    Culture   Final    Multiple bacterial morphotypes present, none predominant. Suggest appropriate recollection if clinically indicated. Performed at [facility]    Report Status 08/29/2014 FINAL  Final     Studies: Dg Chest 2 View  08/28/2014   CLINICAL DATA:  Cardiac arrest today.  EXAM:  CHEST  2 VIEW  COMPARISON:  08/26/2014  FINDINGS: There is a moderate left pleural effusion and probable small right effusion. Bilateral perihilar and lower lobe opacities are noted. This could reflect edema or atelectasis. Heart is borderline in size. No pneumothorax. No acute bony abnormality.  IMPRESSION: Moderate left and small right pleural effusions. Heart is borderline in size with bilateral perihilar and lower lobe atelectasis or edema. Airspace opacities slightly worsened since prior study.   Electronically Signed   By: Charlett Nose M.D.   On: 08/28/2014 16:33    Scheduled Meds: . brimonidine  1 drop Both Eyes Q12H   And  . timolol  1 drop Both Eyes Q12H  . dabigatran  150 mg Oral BID  . dorzolamide  1 drop  Right Eye BID  . escitalopram  10 mg Oral QHS  . latanoprost  1 drop Both Eyes QHS  . losartan  50 mg Oral BID  . metoprolol succinate  25 mg Oral BID  . pantoprazole  40 mg Oral Q0600  . potassium chloride  40 mEq Oral BID  . pravastatin  20 mg Oral QHS  . sodium chloride  3 mL Intravenous Q12H   Continuous Infusions:   Principal Problem:   Ventricular fibrillation Active Problems:   Congestive heart failure   Atrial fibrillation   Glaucoma   Hypertension   Acute exacerbation of CHF (congestive heart failure)   Hyperlipidemia   Hypokalemia   Acute encephalopathy   Cardiac arrest   Acute systolic congestive heart failure   Malnutrition of moderate degree   Pleural effusion   Acute systolic heart failure   Acute pulmonary edema    Time spent: 25 minutes    Shylynn Bruning M.D. [facility] Pager [phone]  If 7PM-7AM, please contact night-coverage at [email or web address], password [facility] 08/30/2014, 4:13 PM  LOS: 5 days

## 2014-08-30 NOTE — Clinical Social Work Placement (Addendum)
    Clinical Social Work Department CLINICAL SOCIAL WORK PLACEMENT NOTE 08/30/2014  Patient:  Sydney Hensley,Sydney Hensley  Account Number:  [id number] Admit date:  08/25/2014  Clinical Social Worker:  DONNA CROWDER, LCSW  Date/time:  08/30/2014 05:00 PM  Clinical Social Work is seeking post-discharge placement for this patient at the following level of care:   SKILLED NURSING   (*CSW will update this form in Epic as items are completed)   08/30/2014  Patient/family provided with [facility] Department of Clinical Social Work's list of facilities offering this level of care within the geographic area requested by the patient (or if unable, by the patient's family).  08/30/2014  Patient/family informed of their freedom to choose among providers that offer the needed level of care, that participate in Medicare, Medicaid or managed care program needed by the patient, have an available bed and are willing to accept the patient.  08/30/2014  Patient/family informed of [facility]' ownership interest in [facility], as well as of the fact that they are under no obligation to receive care at this facility.  PASARR submitted to EDS on 08/30/2014 PASARR number received on 08/30/2014  FL2 transmitted to all facilities in geographic area requested by pt/family on  08/30/2014 FL2 transmitted to all facilities within larger geographic area on   Patient informed that his/her managed care company has contracts with or will negotiate with  certain facilities, including the following:   NA     Patient/family informed of bed offers received:  08/31/2014 [facility] Physician recommends and patient chooses bed at    Patient [facility] on  08/31/2014 Patient to be transferred to facility by Ambulance Patient and family notified of transfer on 08/31/2014 Name of family member notified:  Darnelle Catalanouglas Maness  (nephew)  The following physician request were entered in Epic: Physician Request  Please prepare priority discharge summary and prescriptions.  Please sign FL2.    Additional Comments:

## 2014-08-30 NOTE — Progress Notes (Signed)
Pt c/o itching at back unrelieved by scratching/reposition/lotion. No rash seen. MD on call paged for possible anti-itch cream. Will continue to monitor closely.

## 2014-08-30 NOTE — Progress Notes (Addendum)
Another CSW order placed to place patient in a rehab facility.  Patient has adamantly refused SNF and will return home with support from niece and nephew.  Please see prior CSW notes.  PT is recommending SNF but patient refuses.  CSW will sign off again but will be available if needed.  Thanks! Lorri Frederickonna T. Jaci LazierCrowder, [place] [phone]

## 2014-08-31 LAB — POTASSIUM: POTASSIUM: 4.7 mmol/L (ref 3.5–5.1)

## 2014-08-31 LAB — BASIC METABOLIC PANEL
ANION GAP: 9 (ref 5–15)
BUN: 12 mg/dL (ref 6–23)
CHLORIDE: 98 mmol/L (ref 96–112)
CO2: 29 mmol/L (ref 19–32)
CREATININE: 0.93 mg/dL (ref 0.50–1.10)
Calcium: 9.1 mg/dL (ref 8.4–10.5)
GFR calc Af Amer: 64 mL/min — ABNORMAL LOW (ref >90)
GFR calc non Af Amer: 55 mL/min — ABNORMAL LOW (ref >90)
GLUCOSE: 107 mg/dL — AB (ref 70–99)
Potassium: 5.2 mmol/L — ABNORMAL HIGH (ref 3.5–5.1)
Sodium: 136 mmol/L (ref 135–145)

## 2014-08-31 LAB — CBC
HCT: 39.6 % (ref 36.0–46.0)
HEMOGLOBIN: 12.3 g/dL (ref 12.0–15.0)
MCH: 30 pg (ref 26.0–34.0)
MCHC: 31.1 g/dL (ref 30.0–36.0)
MCV: 96.6 fL (ref 78.0–100.0)
Platelets: 187 10*3/uL (ref 150–400)
RBC: 4.1 MIL/uL (ref 3.87–5.11)
RDW: 14.9 % (ref 11.5–15.5)
WBC: 10.2 10*3/uL (ref 4.0–10.5)

## 2014-08-31 MED ORDER — FUROSEMIDE 40 MG PO TABS
40.0000 mg | ORAL_TABLET | Freq: Once | ORAL | Status: AC
  Administered 2014-08-31: 40 mg via ORAL
  Filled 2014-08-31: qty 1

## 2014-08-31 MED ORDER — METOPROLOL SUCCINATE ER 25 MG PO TB24: 25.0000 mg | ORAL_TABLET | Freq: Two times a day (BID) | ORAL | Status: AC

## 2014-08-31 MED ORDER — PANTOPRAZOLE SODIUM 40 MG PO TBEC: 40.0000 mg | DELAYED_RELEASE_TABLET | Freq: Every day | ORAL | Status: AC

## 2014-08-31 MED ORDER — SODIUM POLYSTYRENE SULFONATE 15 GM/60ML PO SUSP
30.0000 g | Freq: Once | ORAL | Status: AC
  Administered 2014-08-31: 30 g via ORAL
  Filled 2014-08-31: qty 120

## 2014-08-31 MED ORDER — FUROSEMIDE 40 MG PO TABS: 40.0000 mg | ORAL_TABLET | Freq: Every day | ORAL | Status: DC

## 2014-08-31 MED ORDER — ACETAMINOPHEN 325 MG PO TABS
650.0000 mg | ORAL_TABLET | ORAL | Status: DC | PRN
  Administered 2014-08-31: 650 mg via ORAL
  Filled 2014-08-31: qty 2

## 2014-08-31 MED ORDER — FUROSEMIDE 40 MG PO TABS: 40.0000 mg | ORAL_TABLET | Freq: Every day | ORAL | Status: AC

## 2014-08-31 NOTE — Progress Notes (Signed)
CSW Proofreader(Clinical Social Worker) spoke with pt and pt nephew and provided bed offers. They would like to accept [facility]. CSW notified facility who confirmed they can accept pt today. MD aware of bed availability today and confirmed dc plan.   Ethelda Deangelo, LCSWA [phone]

## 2014-08-31 NOTE — Discharge Summary (Signed)
Physician Discharge Summary  [place] Sydney Hensley UJW:[id number] DOB: 01/04/1928 DOA: 08/25/2014  PCP: Ezequiel KayserPERINI,MARK A, MD  Admit date: 08/25/2014 Discharge date: 08/31/2014  Time spent: 30 minutes  Recommendations for Outpatient Follow-up:  1. Please check bmp in tomorrow, if potassium is normal , please start the potassium supplements 2. Follow up with PCP in one week.   Discharge Diagnoses:  Principal Problem:   Ventricular fibrillation Active Problems:   Congestive heart failure   Atrial fibrillation   Glaucoma   Hypertension   Acute exacerbation of CHF (congestive heart failure)   Hyperlipidemia   Hypokalemia   Acute encephalopathy   Cardiac arrest   Acute systolic congestive heart failure   Malnutrition of moderate degree   Pleural effusion   Acute systolic heart failure   Acute pulmonary edema   Discharge Condition: improved.   Diet recommendation: low sodium diet  Filed Weights   08/29/14 0546 08/30/14 0433 08/31/14 0639  Weight: 61.462 kg (135 lb 8 oz) 61.372 kg (135 lb 4.8 oz) 58.877 kg (129 lb 12.8 oz)    History of present illness:  81 y.o. Female admitted for hypokalemia, s/p Vfib cardiac arrest.   Hospital Course:  #1 ventricular fibrillation cardiac arrest Secondary to electrolyte abnormalities of hypokalemia. Status post defibrillation and CPR. Patient subsequently went into bradycardia with PEA and given atropine. Cardiac enzymes minimally elevated however seem to a plateaued. Patient's beta blocker was held, however has been resumed per cardiology. 2-D echo with EF of 35-40% with diffuse hypokinesis and severely dilated left atrium, right ventricle, right atrium. Patient's nephew who is POA does not want a cardiac catheterization. Family does not want aggressive therapy.   #2 hypokalemia Likely secondary to diuretics. Magnesium level at 2. Repleted.  #3 acute encephalopathy Patient with improvement with her confusion. Likely secondary to post arrest. CT head  was negative for any acute abnormalities. Urinalysis negative. Chest x-ray negative for any infiltrate. Her encephalopathy has resolved.   #4 pleural effusions Noted on chest x-ray. Patient noted to be hypoxic on room air with sats dropping into the 70s., currently her oxygen sats have improved Chest x-ray with moderate sized left pleural effusion.as per PCCM fluid not enough for thoracocentesis. Signed off.  She was given lasix and her oxygen sats have improved.   #5 chronic atrial fibrillation Continue Beta blocker for rate control. Pradaxa for anticoagulation.  #6 acute on chronic diastolic heart failure Cardiac enzymes minimally elevated but have plateaued. 2-D echo with EF = 35-40% with diffuse hypokinesis. Severely dilated LA, RV, RA. 2-D echo was significant change from prior echo. Severe RHF. I/O = 185 Wt 61.46 kg from 62.914kg. BNP trending down. Required 2 doses of lasix yesterday. Started her on low dose of lasix 20 mg daily and increased to 40 mg daily. .   #7 Dark stools Patient's hemoglobin has pretty much been stable with no significant drop. H/H stable.   #8 hypertension Continue cozaar and beta blocker. Follow.  Procedures:  Echocardiogram.   Consultations:  Cardiology.   Discharge Exam: Filed Vitals:   08/31/14 0850  BP: 103/64  Pulse:   Temp:   Resp:     General: alert afebrile comfortable Cardiovascular: s1s2 Respiratory: clear  Discharge Instructions   Discharge Instructions    (HEART FAILURE PATIENTS) Call MD:  Anytime you have any of the following symptoms: 1) 3 pound weight gain in 24 hours or 5 pounds in 1 week 2) shortness of breath, with or without a dry hacking cough 3) swelling  in the hands, feet or stomach 4) if you have to sleep on extra pillows at night in order to breathe.    Complete by:  As directed      Diet - low sodium heart healthy    Complete by:  As directed      Discharge instructions    Complete by:  As directed   Follow up  with PCP AND cardiology as recommended.          Current Discharge Medication List    START taking these medications   Details  metoprolol succinate (TOPROL-XL) 25 MG 24 hr tablet Take 1 tablet (25 mg total) by mouth 2 (two) times daily.    pantoprazole (PROTONIX) 40 MG tablet Take 1 tablet (40 mg total) by mouth daily at 6 (six) AM.      CONTINUE these medications which have CHANGED   Details  furosemide (LASIX) 40 MG tablet Take 1 tablet (40 mg total) by mouth daily. Qty: 45 tablet, Refills: 1      CONTINUE these medications which have NOT CHANGED   Details  acetaminophen (TYLENOL) 500 MG tablet Take 1,000 mg by mouth at bedtime.    brimonidine-timolol (COMBIGAN) 0.2-0.5 % ophthalmic solution Place 1 drop into both eyes every 12 (twelve) hours.    dabigatran (PRADAXA) 150 MG CAPS Take 1 capsule (150 mg total) by mouth 2 (two) times daily. Qty: 60 capsule    dorzolamide (TRUSOPT) 2 % ophthalmic solution Place 1 drop into the right eye 2 (two) times daily.    escitalopram (LEXAPRO) 20 MG tablet Take 10 mg by mouth at bedtime.    latanoprost (XALATAN) 0.005 % ophthalmic solution Place 1 drop into both eyes at bedtime.     losartan (COZAAR) 50 MG tablet Take 1 tablet (50 mg total) by mouth 2 (two) times daily. Qty: 180 tablet, Refills: 3    NONFORMULARY OR COMPOUNDED ITEM Apply 1 application topically 4 (four) times daily as needed (for back itching and irritations).    pravastatin (PRAVACHOL) 20 MG tablet Take 20 mg by mouth at bedtime.      STOP taking these medications     metoprolol tartrate (LOPRESSOR) 25 MG tablet        Allergies  Allergen Reactions  . Lisinopril Cough   Follow-up Information    Follow up with [facility].   Why:  Registered Nrse, Physical/Occupational Therapy, Aide and Child psychotherapist.    Contact information:   [address] [place] [address] [phone]       Follow up with Rodrigo Ran A, MD. Schedule an  appointment as soon as possible for a visit in 1 week.   Specialty:  Internal Medicine   Contact information:   [address] [place] [address] [phone]        The results of significant diagnostics from this hospitalization (including imaging, microbiology, ancillary and laboratory) are listed below for reference.    Significant Diagnostic Studies: Dg Chest 2 View  08/28/2014   CLINICAL DATA:  Cardiac arrest today.  EXAM: CHEST  2 VIEW  COMPARISON:  08/26/2014  FINDINGS: There is a moderate left pleural effusion and probable small right effusion. Bilateral perihilar and lower lobe opacities are noted. This could reflect edema or atelectasis. Heart is borderline in size. No pneumothorax. No acute bony abnormality.  IMPRESSION: Moderate left and small right pleural effusions. Heart is borderline in size with bilateral perihilar and lower lobe atelectasis or edema. Airspace opacities slightly worsened  since prior study.   Electronically Signed   By: Charlett Nose M.D.   On: 08/28/2014 16:33   Ct Head Wo Contrast  08/26/2014   CLINICAL DATA:  Confusion  EXAM: CT HEAD WITHOUT CONTRAST  TECHNIQUE: Contiguous axial images were obtained from the base of the skull through the vertex without intravenous contrast.  COMPARISON:  03/08/2014 and 12/18/2013  FINDINGS: Study is limited by motion artifacts. There is mucosal thickening with partial opacification right maxillary sinus. No intracranial hemorrhage, mass effect or midline shift. Stable atrophy and extensive chronic white matter disease. There is old appearing infarct in right PCA territory see axial image 12. No definite acute cortical infarction.  IMPRESSION: Limited study by motion artifacts. Mucosal thickening with partial opacification right maxillary sinus. Stable atrophy and extensive chronic white matter disease. Old appearing infarct in right PCA territory. No definite acute cortical infarction.   Electronically Signed   By: Natasha Mead  M.D.   On: 08/26/2014 16:35   Dg Chest Port 1 View  08/26/2014   CLINICAL DATA:  Confusion  EXAM: PORTABLE CHEST - 1 VIEW  COMPARISON:  12/18/2013  FINDINGS: Cardiomegaly is noted. Central mild vascular congestion without pulmonary edema. Small bilateral pleural effusion with hazy bilateral basilar atelectasis or infiltrate.  IMPRESSION: No pulmonary edema. Small bilateral pleural effusion with hazy bilateral basilar atelectasis or infiltrate.   Electronically Signed   By: Natasha Mead M.D.   On: 08/26/2014 18:28    Microbiology: Recent Results (from the past 240 hour(s))  MRSA PCR Screening     Status: None   Collection Time: 08/25/14  5:04 PM  Result Value Ref Range Status   MRSA by PCR NEGATIVE NEGATIVE Final    Comment:        The GeneXpert MRSA Assay (FDA approved for NASAL specimens only), is one component of a comprehensive MRSA colonization surveillance program. It is not intended to diagnose MRSA infection nor to guide or monitor treatment for MRSA infections.   Culture, Urine     Status: None   Collection Time: 08/27/14  7:30 PM  Result Value Ref Range Status   Specimen Description URINE, RANDOM  Final   Special Requests NONE  Final   Colony Count   Final    >=100,000 COLONIES/ML Performed at [facility]    Culture   Final    Multiple bacterial morphotypes present, none predominant. Suggest appropriate recollection if clinically indicated. Performed at [facility]    Report Status 08/29/2014 FINAL  Final     Labs: Basic Metabolic Panel:  Recent Labs Lab 08/25/14 1410  08/26/14 0758  08/27/14 1610 08/28/14 0545 08/29/14 0550 08/30/14 0520 08/31/14 0610  NA 137  < > 139  < > 142 142 138 137 136  K 2.8*  < > 3.4*  < > 4.2 4.1 4.7 4.9 5.2*  CL 90*  < > 96  < > 101 102 94* 95* 98  CO2 39*  < > 38*  < > 27 37* 36* 35* 29  GLUCOSE 129*  < > 151*  < > 130* 93 94 112* 107*  BUN 10  < > 8  < > 15 18 15 17 12   CREATININE 1.11*  < > 1.14*  < >  1.21* 1.16* 1.04 1.17* 0.93  CALCIUM 8.8  < > 8.6  < > 8.9 8.6 8.8 9.2 9.1  MG 1.7  --  2.0  --  2.2  --   --   --   --   < > =  values in this interval not displayed. Liver Function Tests:  Recent Labs Lab 08/25/14 1410  AST 24  ALT 13  ALKPHOS 41  BILITOT 1.2  PROT 5.6*  ALBUMIN 2.9*   No results for input(s): LIPASE, AMYLASE in the last 168 hours. No results for input(s): AMMONIA in the last 168 hours. CBC:  Recent Labs Lab 08/27/14 0648 08/28/14 0545 08/29/14 0550 08/30/14 0520 08/31/14 0610  WBC 14.6* 11.7* 9.3 10.1 10.2  HGB 12.6 11.7* 12.0 12.9 12.3  HCT 39.4 36.3 38.7 40.9 39.6  MCV 96.8 97.8 97.7 96.5 96.6  PLT 153 148* 156 176 187   Cardiac Enzymes:  Recent Labs Lab 08/26/14 0758 08/26/14 1258 08/26/14 2000  TROPONINI 0.06* 0.06* 0.07*   BNP: BNP (last 3 results)  Recent Labs  08/27/14 0648 08/28/14 0545 08/30/14 0505  BNP 3485.6* 1594.9* 622.4*    ProBNP (last 3 results) No results for input(s): PROBNP in the last 8760 hours.  CBG: No results for input(s): GLUCAP in the last 168 hours.     SignedKathlen Mody  [facility] 08/31/2014, 11:30 AM

## 2014-08-31 NOTE — Progress Notes (Signed)
Physical Therapy Treatment Patient Details Name: Sydney Hensley MRN: [id number] DOB: 11/20/1927 Today's Date: 08/31/2014    History of Present Illness Patient is an 81 yo female admitted 08/25/14 with severe hypokalemia. In ED, patient had V-fib arrest, now with confusion.  PMH:  Afib, HTN, CHF, falls    PT Comments    Pt making steady progress.  Follow Up Recommendations  SNF (pt now agreeable to SNF)     Equipment Recommendations  Other (comment) (received rollator)    Recommendations for Other Services       Precautions / Restrictions Precautions Precautions: Fall    Mobility  Bed Mobility                  Transfers Overall transfer level: Needs assistance Equipment used: Rolling walker (2 wheeled) Transfers: Sit to/from Stand Sit to Stand: Min assist         General transfer comment: Verbal cues for hand placement and assist to bring hips up.  Ambulation/Gait Ambulation/Gait assistance: Min assist;+2 safety/equipment Ambulation Distance (Feet): 75 Feet Assistive device: 4-wheeled walker Gait Pattern/deviations: Step-through pattern;Decreased step length - right;Decreased step length - left;Trunk flexed Gait velocity: decr Gait velocity interpretation: Below normal speed for age/gender General Gait Details: Verbal cues to stand more erect and assist for balance.    Stairs            Wheelchair Mobility    Modified Rankin (Stroke Patients Only)       Balance   Sitting-balance support: No upper extremity supported;Feet supported Sitting balance-Leahy Scale: Fair     Standing balance support: Bilateral upper extremity supported Standing balance-Leahy Scale: Poor Standing balance comment: support of walker and min A                    Cognition Arousal/Alertness: Awake/alert Behavior During Therapy: WFL for tasks assessed/performed Overall Cognitive Status: No family/caregiver present to determine baseline cognitive  functioning       Memory: Decreased short-term memory   Safety/Judgement: Decreased awareness of safety   Problem Solving: Requires verbal cues      Exercises      General Comments        Pertinent Vitals/Pain Pain Assessment: No/denies pain    Home Living                      Prior Function            PT Goals (current goals can now be found in the care plan section) Progress towards PT goals: Progressing toward goals    Frequency  Min 3X/week    PT Plan Discharge plan needs to be updated    Co-evaluation             End of Session Equipment Utilized During Treatment: Gait belt;Oxygen Activity Tolerance: Patient tolerated treatment well Patient left: in chair;with call bell/phone within reach;with chair alarm set     Time: 1109-1120 PT Time Calculation (min) (ACUTE ONLY): 11 min  Charges:  $Gait Training: 8-22 mins                    G Codes:      Kandie Keiper 08/31/2014, 1:30 PM  [employer] CorporationCary Loman Logan PT [phone]

## 2014-08-31 NOTE — Progress Notes (Signed)
Called report to C. Logan BoresEvans, LPN receiving nurse at [facility]. Awaiting PTAR to transfer patient out.

## 2014-08-31 NOTE — Progress Notes (Signed)
Spoke with Dr. Blake DivineAkula, patient can be discharged to SNF prior to K+ level being drawn with orders to repeat BMET at skilled facility in the am.  PTAR called for transport.  Will continue to monitor.  Colman Caterarpley, Hoy Fallert Danielle

## 2014-09-05 ENCOUNTER — Non-Acute Institutional Stay (SKILLED_NURSING_FACILITY): Payer: Medicare Other | Admitting: Internal Medicine

## 2014-09-05 ENCOUNTER — Encounter: Payer: Self-pay | Admitting: Internal Medicine

## 2014-09-05 DIAGNOSIS — J90 Pleural effusion, not elsewhere classified: Secondary | ICD-10-CM

## 2014-09-05 DIAGNOSIS — F0150 Vascular dementia without behavioral disturbance: Secondary | ICD-10-CM

## 2014-09-05 DIAGNOSIS — E440 Moderate protein-calorie malnutrition: Secondary | ICD-10-CM | POA: Diagnosis not present

## 2014-09-05 DIAGNOSIS — I469 Cardiac arrest, cause unspecified: Secondary | ICD-10-CM | POA: Diagnosis not present

## 2014-09-05 DIAGNOSIS — F329 Major depressive disorder, single episode, unspecified: Secondary | ICD-10-CM | POA: Diagnosis not present

## 2014-09-05 DIAGNOSIS — I5021 Acute systolic (congestive) heart failure: Secondary | ICD-10-CM | POA: Diagnosis not present

## 2014-09-05 DIAGNOSIS — I4901 Ventricular fibrillation: Secondary | ICD-10-CM | POA: Diagnosis not present

## 2014-09-05 DIAGNOSIS — F32A Depression, unspecified: Secondary | ICD-10-CM

## 2014-09-05 DIAGNOSIS — I10 Essential (primary) hypertension: Secondary | ICD-10-CM

## 2014-09-05 DIAGNOSIS — I480 Paroxysmal atrial fibrillation: Secondary | ICD-10-CM

## 2014-09-05 NOTE — Progress Notes (Signed)
Patient ID: Sydney Hensley, female   DOB: 01/21/1928, 81 y.o.   MRN: [id number]    09/05/14  Location:  [facility]    Place of Service: SNF [phone])   Extended Emergency Contact Information Primary Emergency Contact: Maness,Douglas Address: [address]          [place] [address] [place] Home Phone: [phone] Mobile Phone: [phone] Relation: Nephew Secondary Emergency Contact: [place] Mobile Phone: [phone] Relation: Niece  Advanced Directive information  FULL CODE  Chief Complaint  Patient presents with  . Discharge Note    HPI:  80 yo female seen today for d/c from SNF after short rehab following hospital stay for Vfib and cardiac arrest s/p CPR, acute systolic HF with hx CHF, PAF, hypokalemia, acute encephalopathy, moderate malnutrition, pleural effusion and acute pulmonary edema. hospital records reviewed. Weight at d/c 129. 2D echo revealed EF 35-40% with diffuse hypokinesis and dilated left atrium. POA indicated no aggressive tx for pt. She was d/c'd on pradaxa.  Today, pt has no c/o SOB or CP. Her nephew is present. She does have an unsteady gait and will need rollator and 3-in-1 commode. She will d/c'd home with [facility] RN, PT and OT. She tolerated SNF PT/ST. D/c weight is 126 lbs. She is a poor historian due to dementia. Hx obtained from chart. No nursing issues. She is eating and sleeping well.  She takes lasix for HF and also losartan, toprol XL which helps BP control. HR controlled with dabigatran etexilate. 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 Glaucoma   . Hypertension   . Dysrhythmia   . Atrial fibrillation   . CHF (congestive heart failure)     Diastolic dysfunction  . Blind right eye   . High cholesterol   . Pneumonia "several times"  . Cellulitis X 2   "legs"  . Stroke     "peripheral vision in left eye only ater her strokes; major stroke 12/18/2013; small strokes since then" (08/25/2014)  . Depression   . Dementia     "since stroke in 12/2013"  . Ventricular fibrillation 08/25/2014 in ER    Past Surgical History  Procedure Laterality Date  . Abdominal hysterectomy  1950's    Patient Care Team: Rodrigo Ran, MD as PCP - General (Internal Medicine)  History   Social History  . Marital Status: Single    Spouse Name: N/A  . Number of Children: N/A  . Years of Education: N/A   Occupational History  . Not on file.   Social History Main Topics  . Smoking status: Former Smoker    Types: Cigarettes  . Smokeless tobacco: Never Used     Comment: "smoked some in her 20's"  . Alcohol Use: No  . Drug Use: No  . Sexual Activity: No   Other Topics Concern  . Not on file   Social History Narrative     reports that she has quit smoking. Her smoking use included Cigarettes. She has never used smokeless tobacco. She reports that she does not drink alcohol or use illicit drugs.  Family History  Problem Relation Age of Onset  . Cancer Father   . CVA Mother    Family Status  Relation Status Death Age  . Father Deceased   . Mother Deceased  Immunization History  Administered Date(s) Administered  . Influenza Split 03/24/2012  . Pneumococcal Polysaccharide-23 03/24/2012    Allergies  Allergen Reactions  . Lisinopril Cough    Medications: Patient's Medications  New Prescriptions   No medications on file  Previous Medications   ACETAMINOPHEN (TYLENOL) 500 MG TABLET    Take 1,000 mg by mouth at bedtime.   BRIMONIDINE-TIMOLOL (COMBIGAN) 0.2-0.5 % OPHTHALMIC SOLUTION    Place 1 drop into both eyes every 12 (twelve) hours.   DABIGATRAN (PRADAXA) 150 MG CAPS    Take 1 capsule (150 mg total) by mouth 2 (two) times daily.   DORZOLAMIDE (TRUSOPT) 2 % OPHTHALMIC SOLUTION    Place 1 drop into the right eye 2 (two) times daily.    ESCITALOPRAM (LEXAPRO) 20 MG TABLET    Take 10 mg by mouth at bedtime.   FUROSEMIDE (LASIX) 40 MG TABLET    Take 1 tablet (40 mg total) by mouth daily.   LATANOPROST (XALATAN) 0.005 % OPHTHALMIC SOLUTION    Place 1 drop into both eyes at bedtime.    LOSARTAN (COZAAR) 50 MG TABLET    Take 1 tablet (50 mg total) by mouth 2 (two) times daily.   METOPROLOL SUCCINATE (TOPROL-XL) 25 MG 24 HR TABLET    Take 1 tablet (25 mg total) by mouth 2 (two) times daily.   NONFORMULARY OR COMPOUNDED ITEM    Apply 1 application topically 4 (four) times daily as needed (for back itching and irritations).   PANTOPRAZOLE (PROTONIX) 40 MG TABLET    Take 1 tablet (40 mg total) by mouth daily at 6 (six) AM.   PRAVASTATIN (PRAVACHOL) 20 MG TABLET    Take 20 mg by mouth at bedtime.  Modified Medications   No medications on file  Discontinued Medications   No medications on file    Review of Systems  Unable to perform ROS: Other   encephalopathy and dementia   Filed Vitals:   09/05/14 1621  BP: 104/69  Pulse: 80  Temp: 97.4 F (36.3 C)  Weight: 126 lb (57.153 kg)  SpO2: 94%   Body mass index is 20.97 kg/(m^2).  Physical Exam  Constitutional: She appears well-developed.  Frail appearing in NAD  HENT:  Mouth/Throat: Oropharynx is clear and moist. No oropharyngeal exudate.  Eyes: Pupils are equal, round, and reactive to light. No scleral icterus.  OD blindness  Neck: Neck supple. No tracheal deviation present. No thyromegaly present.  Cardiovascular: Normal rate and intact distal pulses.  An irregular rhythm present. Exam reveals no gallop and no friction rub.   Murmur heard.  Systolic murmur is present with a grade of 1/6  No LE edema b/l. no calf TTP. No carotid bruit b/l  Pulmonary/Chest: Effort normal and breath sounds normal. No stridor. No respiratory distress. She has no wheezes. She has no rales.  Abdominal: Soft. Bowel sounds are normal. She exhibits no distension and no mass. There is no  tenderness. There is no rebound and no guarding.  Musculoskeletal: She exhibits edema and tenderness.  Gait unsteady  Lymphadenopathy:    She has no cervical adenopathy.  Neurological: She is alert.  Skin: Skin is warm and dry. Bruising noted. No rash noted.  Psychiatric: She has a normal mood and affect. Her behavior is normal.     Labs reviewed: Admission on 08/25/2014, Discharged on 08/31/2014  No results displayed because visit has over 200 results.     CBC Latest Ref Rng 08/31/2014 08/30/2014 08/29/2014  WBC 4.0 -  10.5 K/uL 10.2 10.1 9.3  Hemoglobin 12.0 - 15.0 g/dL 16.1 09.6 04.5  Hematocrit 36.0 - 46.0 % 39.6 40.9 38.7  Platelets 150 - 400 K/uL 187 176 156    CMP Latest Ref Rng 08/31/2014 08/31/2014 08/30/2014  Glucose 70 - 99 mg/dL - 409(W) 119(J)  BUN 6 - 23 mg/dL - 12 17  Creatinine 4.78 - 1.10 mg/dL - 2.95 6.21(H)  Sodium 135 - 145 mmol/L - 136 137  Potassium 3.5 - 5.1 mmol/L 4.7 5.2(H) 4.9  Chloride 96 - 112 mmol/L - 98 95(L)  CO2 19 - 32 mmol/L - 29 35(H)  Calcium 8.4 - 10.5 mg/dL - 9.1 9.2  Total Protein 6.0 - 8.3 g/dL - - -  Total Bilirubin 0.3 - 1.2 mg/dL - - -  Alkaline Phos 39 - 117 U/L - - -  AST 0 - 37 U/L - - -  ALT 0 - 35 U/L - - -     Dg Chest 2 View  08/28/2014   CLINICAL DATA:  Cardiac arrest today.  EXAM: CHEST  2 VIEW  COMPARISON:  08/26/2014  FINDINGS: There is a moderate left pleural effusion and probable small right effusion. Bilateral perihilar and lower lobe opacities are noted. This could reflect edema or atelectasis. Heart is borderline in size. No pneumothorax. No acute bony abnormality.  IMPRESSION: Moderate left and small right pleural effusions. Heart is borderline in size with bilateral perihilar and lower lobe atelectasis or edema. Airspace opacities slightly worsened since prior study.   Electronically Signed   By: Charlett Nose M.D.   On: 08/28/2014 16:33   Ct Head Wo Contrast  08/26/2014   CLINICAL DATA:  Confusion  EXAM: CT HEAD WITHOUT  CONTRAST  TECHNIQUE: Contiguous axial images were obtained from the base of the skull through the vertex without intravenous contrast.  COMPARISON:  03/08/2014 and 12/18/2013  FINDINGS: Study is limited by motion artifacts. There is mucosal thickening with partial opacification right maxillary sinus. No intracranial hemorrhage, mass effect or midline shift. Stable atrophy and extensive chronic white matter disease. There is old appearing infarct in right PCA territory see axial image 12. No definite acute cortical infarction.  IMPRESSION: Limited study by motion artifacts. Mucosal thickening with partial opacification right maxillary sinus. Stable atrophy and extensive chronic white matter disease. Old appearing infarct in right PCA territory. No definite acute cortical infarction.   Electronically Signed   By: Natasha Mead M.D.   On: 08/26/2014 16:35   Dg Chest Port 1 View  08/26/2014   CLINICAL DATA:  Confusion  EXAM: PORTABLE CHEST - 1 VIEW  COMPARISON:  12/18/2013  FINDINGS: Cardiomegaly is noted. Central mild vascular congestion without pulmonary edema. Small bilateral pleural effusion with hazy bilateral basilar atelectasis or infiltrate.  IMPRESSION: No pulmonary edema. Small bilateral pleural effusion with hazy bilateral basilar atelectasis or infiltrate.   Electronically Signed   By: Natasha Mead M.D.   On: 08/26/2014 18:28     Assessment/Plan   ICD-9-CM ICD-10-CM   1. Ventricular fibrillation s/p CPR 427.41 I49.01   2. Cardiac arrest s/p CPR 427.5 I46.9   3. Acute systolic heart failure - resolved 428.21 I50.21   4. Paroxysmal atrial fibrillation - rate controlled; on pradaxa 427.31 I48.0   5. Essential hypertension - controlled 401.9 I10   6. Malnutrition of moderate degree 263.0 E44.0   7. Pleural effusion with recent acute pulmonary edema 511.9 J90   8.      Dementia - most likely vascular due  to hx stroke; back to baseline 9.      Depression - stable  --check BMP today  --Patient is  being discharged with home health services: RN (med mx), PT (strength/endurance) and OT (ADL training)   --Patient is being discharged with the following durable medical equipment:  Rollator, 3-in-1 bedside commode  --Patient has been advised to f/u with their PCP in 1-2 weeks to bring them up to date on their rehab stay.  They were provided with a 30 day supply of scripts for prescription medications and refills must be obtained from their PCP.  TIME SPENT (MINUTES): 45  Amorina Doerr S. Ancil Linsey  [facility] [address] [place], [place] [address] [phone] Cell (Monday-Friday 8 AM - 5 PM) [phone] After 5 PM and follow prompts

## 2014-10-19 DIAGNOSIS — F0150 Vascular dementia without behavioral disturbance: Secondary | ICD-10-CM | POA: Insufficient documentation

## 2015-02-02 ENCOUNTER — Other Ambulatory Visit: Payer: Self-pay | Admitting: Internal Medicine

## 2015-02-02 ENCOUNTER — Ambulatory Visit (HOSPITAL_COMMUNITY)
Admission: RE | Admit: 2015-02-02 | Discharge: 2015-02-02 | Disposition: A | Discharge status: Home / Self Care | Payer: Medicare Other | Source: Ambulatory Visit | Attending: Vascular Surgery | Admitting: Vascular Surgery

## 2015-02-02 DIAGNOSIS — I10 Essential (primary) hypertension: Secondary | ICD-10-CM | POA: Diagnosis not present

## 2015-02-02 DIAGNOSIS — R600 Localized edema: Secondary | ICD-10-CM

## 2015-02-23 ENCOUNTER — Ambulatory Visit (INDEPENDENT_AMBULATORY_CARE_PROVIDER_SITE_OTHER): Payer: Medicare Other | Admitting: Cardiology

## 2015-02-23 VITALS — BP 122/64 | HR 46 | Ht 64.0 in | Wt 144.9 lb

## 2015-02-23 DIAGNOSIS — I480 Paroxysmal atrial fibrillation: Secondary | ICD-10-CM | POA: Diagnosis not present

## 2015-02-23 DIAGNOSIS — E785 Hyperlipidemia, unspecified: Secondary | ICD-10-CM | POA: Diagnosis not present

## 2015-02-23 DIAGNOSIS — I10 Essential (primary) hypertension: Secondary | ICD-10-CM | POA: Diagnosis not present

## 2015-02-23 NOTE — Patient Instructions (Signed)
NO CHANGE MEDICATIONS   Your physician recommends that you schedule a follow-up appointment in 6 MONTHS WITH DR [facility].

## 2015-02-23 NOTE — Progress Notes (Signed)
HPI The patient presents for evaluation of atrial fibrillation and diastolic heart failure.   In the spring of this year she had a V. Fib arrest probably related to hypokalemia. I reviewed these records today. Her ejection fraction is modestly reduced 35-40%. Remarkably she recovered from this and is back home. She actually despite some progressive memory deficits lives by herself although her family checks on her multiple times through the day. She gets around slowly and has chronic dyspnea with exertion. She denies any PND or orthopnea. He's not had any palpitations, presyncope or syncope. There has been some mild lower extremity swelling. She's not had any chest pain.  Allergies  Allergen Reactions  . Lisinopril Cough    Current Outpatient Prescriptions  Medication Sig Dispense Refill  . acetaminophen (TYLENOL) 500 MG tablet Take 1,000 mg by mouth at bedtime.    Marland Kitchen aspirin 81 MG chewable tablet Chew 81 mg by mouth daily.    . brimonidine-timolol (COMBIGAN) 0.2-0.5 % ophthalmic solution Place 1 drop into both eyes every 12 (twelve) hours.    . dorzolamide (TRUSOPT) 2 % ophthalmic solution Place 1 drop into the right eye 2 (two) times daily.    Marland Kitchen escitalopram (LEXAPRO) 20 MG tablet Take 10 mg by mouth at bedtime.    . furosemide (LASIX) 40 MG tablet Take 1 tablet (40 mg total) by mouth daily. 45 tablet 1  . latanoprost (XALATAN) 0.005 % ophthalmic solution Place 1 drop into both eyes at bedtime.     Marland Kitchen losartan (COZAAR) 50 MG tablet Take 1 tablet (50 mg total) by mouth 2 (two) times daily. 180 tablet 3  . metoprolol succinate (TOPROL-XL) 25 MG 24 hr tablet Take 1 tablet (25 mg total) by mouth 2 (two) times daily.    . NONFORMULARY OR COMPOUNDED ITEM Apply 1 application topically 4 (four) times daily as needed (for back itching and irritations).    . pantoprazole (PROTONIX) 40 MG tablet Take 1 tablet (40 mg total) by mouth daily at 6 (six) AM.    . potassium chloride SA (K-DUR,KLOR-CON) 20 MEQ  tablet Take 1 tablet by mouth daily.    . pravastatin (PRAVACHOL) 20 MG tablet Take 20 mg by mouth at bedtime.     No current facility-administered medications for this visit.    Past Medical History  Diagnosis Date  . Glaucoma   . Hypertension   . Dysrhythmia   . Atrial fibrillation (HCC)   . CHF (congestive heart failure) (HCC)     Diastolic dysfunction  . Blind right eye   . High cholesterol   . Pneumonia "several times"  . Cellulitis X 2    "legs"  . Stroke [facility]     "peripheral vision in left eye only ater her strokes; major stroke 12/18/2013; small strokes since then" (08/25/2014)  . Depression   . Dementia     "since stroke in 12/2013"  . Ventricular fibrillation (HCC) 08/25/2014 in ER    Past Surgical History  Procedure Laterality Date  . Abdominal hysterectomy  1950's    ROS:  As stated in the history of present illness and negative for all other systems.  PHYSICAL EXAM BP 122/64 mmHg  Pulse 46  Ht  (1.626 m)  Wt 144 lb 14.4 oz (65.726 kg)  BMI 24.86 kg/m2 GENERAL:  Frail appearing, no distress HEENT:  Pupils equal round and reactive, fundi not visualized, oral mucosa unremarkable NECK: No JVD at 90 degrees, waveform within normal limits, carotid upstroke brisk and  symmetric, no bruits, no thyromegaly LUNGS:  Clear to auscultation bilaterally BACK:  No CVA tenderness CHEST:  Unremarkable HEART:  PMI not displaced or sustained,S1 and S2 within normal limits, no S3, no clicks, no rubs, no murmurs, irregular ABD:  Flat, positive bowel sounds normal in frequency in pitch, no bruits, no rebound, no guarding. EXT:  2 plus pulses upper, no cyanosis no clubbing, mild edema,  Dependent rubor  EKG:  Atrial fibrillation, rate 46, left axis deviation, left anterior fascicular block, interventricular conduction delay, lateral T-wave inversions unchanged from previous. 02/23/2015  ASSESSMENT AND PLAN  Atrial fibrillation - She has a slow rate seems to tolerate this.  She's no longer on anticoagulation because she had guaiac positive stools and anemia. She and her family understand the risk of stroke but it does seem that the risk of bleeding on anticoagulation at present outweighs the benefit.  VFib arrest - The patient's family and healthcare power of attorney did not want consideration of an ICD which is appropriate. She's had no further symptomatic dysrhythmias. No change in therapy is indicated.  Congestive heart failure - She will remain on the meds as listed.  She seems to be euvolemic.  HTN - The blood pressure is at target. No change in medications is indicated. We will continue with therapeutic lifestyle changes (TLC).

## 2015-02-25 ENCOUNTER — Encounter: Payer: Self-pay | Admitting: Cardiology

## 2015-03-13 ENCOUNTER — Encounter: Payer: Self-pay | Admitting: Cardiology

## 2015-03-17 ENCOUNTER — Emergency Department (HOSPITAL_COMMUNITY): Payer: Medicare Other

## 2015-03-17 ENCOUNTER — Inpatient Hospital Stay (HOSPITAL_COMMUNITY)
Admission: EM | Admit: 2015-03-17 | Discharge: 2015-03-20 | DRG: 871 | Disposition: E | Payer: Medicare Other | Attending: Internal Medicine | Admitting: Internal Medicine

## 2015-03-17 DIAGNOSIS — R001 Bradycardia, unspecified: Secondary | ICD-10-CM | POA: Diagnosis present

## 2015-03-17 DIAGNOSIS — Z79899 Other long term (current) drug therapy: Secondary | ICD-10-CM

## 2015-03-17 DIAGNOSIS — H409 Unspecified glaucoma: Secondary | ICD-10-CM | POA: Diagnosis present

## 2015-03-17 DIAGNOSIS — G9340 Encephalopathy, unspecified: Secondary | ICD-10-CM

## 2015-03-17 DIAGNOSIS — I110 Hypertensive heart disease with heart failure: Secondary | ICD-10-CM | POA: Diagnosis present

## 2015-03-17 DIAGNOSIS — A419 Sepsis, unspecified organism: Principal | ICD-10-CM | POA: Diagnosis present

## 2015-03-17 DIAGNOSIS — Z888 Allergy status to other drugs, medicaments and biological substances status: Secondary | ICD-10-CM

## 2015-03-17 DIAGNOSIS — Z8673 Personal history of transient ischemic attack (TIA), and cerebral infarction without residual deficits: Secondary | ICD-10-CM

## 2015-03-17 DIAGNOSIS — E872 Acidosis, unspecified: Secondary | ICD-10-CM | POA: Diagnosis present

## 2015-03-17 DIAGNOSIS — R531 Weakness: Secondary | ICD-10-CM

## 2015-03-17 DIAGNOSIS — I10 Essential (primary) hypertension: Secondary | ICD-10-CM | POA: Diagnosis present

## 2015-03-17 DIAGNOSIS — R29810 Facial weakness: Secondary | ICD-10-CM | POA: Diagnosis present

## 2015-03-17 DIAGNOSIS — E7800 Pure hypercholesterolemia, unspecified: Secondary | ICD-10-CM | POA: Diagnosis present

## 2015-03-17 DIAGNOSIS — R0781 Pleurodynia: Secondary | ICD-10-CM | POA: Diagnosis not present

## 2015-03-17 DIAGNOSIS — J189 Pneumonia, unspecified organism: Secondary | ICD-10-CM | POA: Diagnosis present

## 2015-03-17 DIAGNOSIS — I5043 Acute on chronic combined systolic (congestive) and diastolic (congestive) heart failure: Secondary | ICD-10-CM | POA: Diagnosis present

## 2015-03-17 DIAGNOSIS — Z823 Family history of stroke: Secondary | ICD-10-CM

## 2015-03-17 DIAGNOSIS — Z66 Do not resuscitate: Secondary | ICD-10-CM | POA: Diagnosis present

## 2015-03-17 DIAGNOSIS — I4891 Unspecified atrial fibrillation: Secondary | ICD-10-CM | POA: Diagnosis present

## 2015-03-17 DIAGNOSIS — H5441 Blindness, right eye, normal vision left eye: Secondary | ICD-10-CM | POA: Diagnosis present

## 2015-03-17 DIAGNOSIS — R748 Abnormal levels of other serum enzymes: Secondary | ICD-10-CM | POA: Diagnosis present

## 2015-03-17 DIAGNOSIS — F0150 Vascular dementia without behavioral disturbance: Secondary | ICD-10-CM | POA: Diagnosis present

## 2015-03-17 DIAGNOSIS — R079 Chest pain, unspecified: Secondary | ICD-10-CM

## 2015-03-17 DIAGNOSIS — T368X5A Adverse effect of other systemic antibiotics, initial encounter: Secondary | ICD-10-CM | POA: Diagnosis not present

## 2015-03-17 DIAGNOSIS — Z7982 Long term (current) use of aspirin: Secondary | ICD-10-CM

## 2015-03-17 DIAGNOSIS — I5022 Chronic systolic (congestive) heart failure: Secondary | ICD-10-CM | POA: Diagnosis present

## 2015-03-17 DIAGNOSIS — I5033 Acute on chronic diastolic (congestive) heart failure: Secondary | ICD-10-CM

## 2015-03-17 DIAGNOSIS — L271 Localized skin eruption due to drugs and medicaments taken internally: Secondary | ICD-10-CM | POA: Diagnosis not present

## 2015-03-17 DIAGNOSIS — E785 Hyperlipidemia, unspecified: Secondary | ICD-10-CM | POA: Diagnosis present

## 2015-03-17 DIAGNOSIS — Z87891 Personal history of nicotine dependence: Secondary | ICD-10-CM

## 2015-03-17 DIAGNOSIS — R7989 Other specified abnormal findings of blood chemistry: Secondary | ICD-10-CM

## 2015-03-17 DIAGNOSIS — R600 Localized edema: Secondary | ICD-10-CM | POA: Diagnosis present

## 2015-03-17 DIAGNOSIS — Z881 Allergy status to other antibiotic agents status: Secondary | ICD-10-CM

## 2015-03-17 DIAGNOSIS — R778 Other specified abnormalities of plasma proteins: Secondary | ICD-10-CM | POA: Diagnosis present

## 2015-03-17 LAB — COMPREHENSIVE METABOLIC PANEL
ALT: 19 U/L (ref 14–54)
AST: 31 U/L (ref 15–41)
Albumin: 2.9 g/dL — ABNORMAL LOW (ref 3.5–5.0)
Alkaline Phosphatase: 80 U/L (ref 38–126)
Anion gap: 13 (ref 5–15)
BUN: 15 mg/dL (ref 6–20)
CALCIUM: 9 mg/dL (ref 8.9–10.3)
CHLORIDE: 101 mmol/L (ref 101–111)
CO2: 22 mmol/L (ref 22–32)
CREATININE: 0.96 mg/dL (ref 0.44–1.00)
GFR, EST NON AFRICAN AMERICAN: 52 mL/min — AB (ref >60)
Glucose, Bld: 221 mg/dL — ABNORMAL HIGH (ref 65–99)
Potassium: 4.2 mmol/L (ref 3.5–5.1)
Sodium: 136 mmol/L (ref 135–145)
Total Bilirubin: 0.9 mg/dL (ref 0.3–1.2)
Total Protein: 6.4 g/dL — ABNORMAL LOW (ref 6.5–8.1)

## 2015-03-17 LAB — CBC WITH DIFFERENTIAL/PLATELET
Basophils Absolute: 0.1 10*3/uL (ref 0.0–0.1)
Basophils Relative: 1 %
Eosinophils Absolute: 0.1 10*3/uL (ref 0.0–0.7)
Eosinophils Relative: 1 %
HEMATOCRIT: 37.8 % (ref 36.0–46.0)
HEMOGLOBIN: 11.4 g/dL — AB (ref 12.0–15.0)
Lymphocytes Relative: 8 %
Lymphs Abs: 1.3 10*3/uL (ref 0.7–4.0)
MCH: 28 pg (ref 26.0–34.0)
MCHC: 30.2 g/dL (ref 30.0–36.0)
MCV: 92.9 fL (ref 78.0–100.0)
MONO ABS: 2.1 10*3/uL — AB (ref 0.1–1.0)
Monocytes Relative: 12 %
NEUTROS ABS: 13.9 10*3/uL — AB (ref 1.7–7.7)
Neutrophils Relative %: 78 %
Platelets: 239 10*3/uL (ref 150–400)
RBC: 4.07 MIL/uL (ref 3.87–5.11)
RDW: 16 % — ABNORMAL HIGH (ref 11.5–15.5)
WBC: 17.5 10*3/uL — ABNORMAL HIGH (ref 4.0–10.5)

## 2015-03-17 LAB — I-STAT CG4 LACTIC ACID, ED: Lactic Acid, Venous: 4.08 mmol/L (ref 0.5–2.0)

## 2015-03-17 LAB — TROPONIN I: Troponin I: 0.22 ng/mL — ABNORMAL HIGH (ref <0.031)

## 2015-03-17 LAB — LIPASE, BLOOD: LIPASE: 18 U/L (ref 11–51)

## 2015-03-17 MED ORDER — SODIUM CHLORIDE 0.9 % IV BOLUS (SEPSIS)
500.0000 mL | Freq: Once | INTRAVENOUS | Status: AC
  Administered 2015-03-17: 500 mL via INTRAVENOUS

## 2015-03-17 MED ORDER — PIPERACILLIN-TAZOBACTAM 3.375 G IVPB 30 MIN
3.3750 g | Freq: Once | INTRAVENOUS | Status: AC
  Administered 2015-03-18: 3.375 g via INTRAVENOUS
  Filled 2015-03-17: qty 50

## 2015-03-17 MED ORDER — ONDANSETRON HCL 4 MG/2ML IJ SOLN
4.0000 mg | Freq: Once | INTRAMUSCULAR | Status: AC
Start: 2015-03-17 — End: 2015-03-17
  Administered 2015-03-17: 4 mg via INTRAVENOUS

## 2015-03-17 MED ORDER — FUROSEMIDE 10 MG/ML IJ SOLN
60.0000 mg | Freq: Once | INTRAMUSCULAR | Status: AC
  Administered 2015-03-18: 60 mg via INTRAVENOUS
  Filled 2015-03-17: qty 6

## 2015-03-17 MED ORDER — SODIUM CHLORIDE 0.9 % IV SOLN: 100.0000 mL/h | INTRAVENOUS | Status: DC

## 2015-03-17 MED ORDER — VANCOMYCIN HCL IN DEXTROSE 1-5 GM/200ML-% IV SOLN
1000.0000 mg | Freq: Once | INTRAVENOUS | Status: AC
  Administered 2015-03-18: 1000 mg via INTRAVENOUS
  Filled 2015-03-17: qty 200

## 2015-03-17 MED ORDER — SODIUM CHLORIDE 0.9 % IV BOLUS (SEPSIS): 1000.0000 mL | INTRAVENOUS | Status: DC

## 2015-03-17 MED ORDER — ONDANSETRON HCL 4 MG/2ML IJ SOLN
INTRAMUSCULAR | Status: AC
  Filled 2015-03-17: qty 2

## 2015-03-17 NOTE — ED Notes (Signed)
[facility] contacted to page Code Sepsis 

## 2015-03-17 NOTE — Progress Notes (Signed)
ANTIBIOTIC CONSULT NOTE - INITIAL  Pharmacy Consult for vancomycin, zosyn Indication: rule out sepsis  Allergies  Allergen Reactions  . Lisinopril Cough    Patient Measurements:   Adjusted Body Weight:   Vital Signs: Temp: 98 F (36.7 C) (10/29 2207) Temp Source: Oral (10/29 2207) BP: 139/83 mmHg (10/29 2245) Pulse Rate: 41 (10/29 2245) Intake/Output from previous day:   Intake/Output from this shift:    Labs:  Recent Labs  April 15, 2015 2241  WBC 17.5*  HGB 11.4*  PLT 239  CREATININE 0.96   CrCl cannot be calculated (Unknown ideal weight.). No results for input(s): VANCOTROUGH, VANCOPEAK, VANCORANDOM, GENTTROUGH, GENTPEAK, GENTRANDOM, TOBRATROUGH, TOBRAPEAK, TOBRARND, AMIKACINPEAK, AMIKACINTROU, AMIKACIN in the last 72 hours.   Microbiology: No results found for this or any previous visit (from the past 720 hour(s)).  Medical History: Past Medical History  Diagnosis Date  . Glaucoma   . Hypertension   . Dysrhythmia   . Atrial fibrillation (HCC)   . CHF (congestive heart failure) (HCC)     Diastolic dysfunction  . Blind right eye   . High cholesterol   . Pneumonia "several times"  . Cellulitis X 2    "legs"  . Stroke [facility](HCC)     "peripheral vision in left eye only ater her strokes; major stroke 12/18/2013; small strokes since then" (08/25/2014)  . Depression   . Dementia     "since stroke in 12/2013"  . Ventricular fibrillation (HCC) 08/25/2014 in ER    Medications:   (Not in a hospital admission)   Assessment: Vanc/Zosyn for sepsis, SCr 0.9, LA 4, WBC 17.5 eCrCl 40-45 ml/min  Rec'd first doses in ED  Goal of Therapy:  Vancomycin trough level 15-20 mcg/ml  Plan:  -Zosyn 3.375 g IV q8h -Vancomycin 1 g IV x1 then 500/12h -Monitor renal fx, cultures, duration of therapy    Agapito GamesAlison Airen Stiehl, PharmD, BCPS Clinical Pharmacist Pager: [phone] Oct 03, 2014 11:56 PM

## 2015-03-17 NOTE — ED Provider Notes (Signed)
CSN: [id number]     Arrival date & time 13-Jan-2015  2200 History   First MD Initiated Contact with Patient 13-Jan-2015 2225     Chief Complaint  Patient presents with  . Shortness of Breath  . Chest Pain  . Facial Droop     (Consider location/radiation/quality/duration/timing/severity/associated sxs/prior Treatment) HPI   81 year old female with history of atrial fibrillation currently not on any anticoagulant due to having prior GI bleed, hypertension, prior stroke, dementia, presented to the ED for evaluation of generalized weakness with chest pain and shortness of breath. History obtained through family member who is at bedside. Per daughter, patient was doing fine this morning mentating at her baseline. This afternoon she was complaining of feeling very weak, having neck pain, having chest pain in breathing discomfort, and requested to come to the hospital. This is unusual for her because she does not normally request to come to the hospital. History is limited as patient appears altered. Patient continues to complain of neck pain.  She is DNR/DNI.      Past Medical History  Diagnosis Date  . Glaucoma   . Hypertension   . Dysrhythmia   . Atrial fibrillation (HCC)   . CHF (congestive heart failure) (HCC)     Diastolic dysfunction  . Blind right eye   . High cholesterol   . Pneumonia "several times"  . Cellulitis X 2    "legs"  . Stroke [facility](HCC)     "peripheral vision in left eye only ater her strokes; major stroke 12/18/2013; small strokes since then" (08/25/2014)  . Depression   . Dementia     "since stroke in 12/2013"  . Ventricular fibrillation (HCC) 08/25/2014 in ER   Past Surgical History  Procedure Laterality Date  . Abdominal hysterectomy  1950's   Family History  Problem Relation Age of Onset  . Cancer Father   . CVA Mother    Social History  Substance Use Topics  . Smoking status: Former Smoker    Types: Cigarettes  . Smokeless tobacco: Never Used     Comment:  "smoked some in her 20's"  . Alcohol Use: No   OB History    No data available     Review of Systems  Unable to perform ROS: Mental status change      Allergies  Lisinopril  Home Medications   Prior to Admission medications   Medication Sig Start Date End Date Taking? Authorizing Provider  aspirin EC 81 MG tablet Take 81 mg by mouth daily.   Yes Historical Provider, MD  bisacodyl (DULCOLAX) 5 MG EC tablet Take 5 mg by mouth daily as needed for moderate constipation.   Yes Historical Provider, MD  brimonidine-timolol (COMBIGAN) 0.2-0.5 % ophthalmic solution Place 1 drop into both eyes every 12 (twelve) hours.   Yes Historical Provider, MD  dabigatran (PRADAXA) 150 MG CAPS capsule Take 150 mg by mouth 2 (two) times daily.   Yes Historical Provider, MD  docusate sodium (COLACE) 100 MG capsule Take 100 mg by mouth daily as needed for mild constipation.   Yes Historical Provider, MD  escitalopram (LEXAPRO) 20 MG tablet Take 10 mg by mouth at bedtime.   Yes Historical Provider, MD  furosemide (LASIX) 40 MG tablet Take 1 tablet (40 mg total) by mouth daily. Patient taking differently: Take 20 mg by mouth daily.  08/31/14  Yes Kathlen ModyVijaya Akula, MD  latanoprost (XALATAN) 0.005 % ophthalmic solution Place 1 drop into both eyes at bedtime.  01/21/13  Yes  Historical Provider, MD  losartan (COZAAR) 50 MG tablet Take 1 tablet (50 mg total) by mouth 2 (two) times daily. Patient taking differently: Take 25 mg by mouth daily.  03/27/14  Yes Rollene Rotunda, MD  metoprolol succinate (TOPROL-XL) 25 MG 24 hr tablet Take 1 tablet (25 mg total) by mouth 2 (two) times daily. 08/31/14  Yes Kathlen Mody, MD  pantoprazole (PROTONIX) 40 MG tablet Take 1 tablet (40 mg total) by mouth daily at 6 (six) AM. 08/31/14  Yes Kathlen Mody, MD  potassium chloride SA (K-DUR,KLOR-CON) 20 MEQ tablet Take 20 mEq by mouth daily.  02/23/15  Yes Historical Provider, MD  pravastatin (PRAVACHOL) 20 MG tablet Take 20 mg by mouth at bedtime.    Yes Historical Provider, MD  dorzolamide-timolol (COSOPT) 22.3-6.8 MG/ML ophthalmic solution Place 1 drop into both eyes 2 (two) times daily.    Historical Provider, MD   BP 139/83 mmHg  Pulse 41  Temp(Src) 98 F (36.7 C) (Oral)  Resp 12  SpO2 91% Physical Exam  Constitutional: She appears well-developed and well-nourished. No distress.  Ill-appearing Caucasian elderly female, pale  HENT:  Head: Atraumatic.  Mouth is dry.  Eyes: Conjunctivae and EOM are normal. Pupils are equal, round, and reactive to light.  Neck: Neck supple.  Diffuse tenderness along posterior neck without nuchal rigidity or overlying skin changes.  Cardiovascular: Normal rate and regular rhythm.   Pulmonary/Chest: Effort normal and breath sounds normal.  Poor respiratory effort without frank wheezes rales or rhonchi  Abdominal: Soft. There is no tenderness.  Musculoskeletal: She exhibits edema ( 2+ pitting edema2 bilateral lower extremities with intact distal pulses.).  Neurological: She is alert.  Patient is alert to self and situation but unaware of date and time, year or current president.  Neurologic exam:  Speech garble, pupils equal round reactive to light, extraocular movements intact  Normal peripheral visual fields Mild left facial droop Follows commands, moves all extremities x4, poor effort with decreased but equal strength to bilateral upper and lower extremities at all major muscle groups including grip Sensation normal to light touch  Poor coordination to finger to nose. No pronator drift Gait not tested   Skin: Skin is warm. No rash noted.  Psychiatric: She has a normal mood and affect.  Nursing note and vitals reviewed.   ED Course  Procedures (including critical care time)  Patient with hx of afib not on anticoagulant except baby ASA here with weakness, mild left-sided facial droop, last known normal earlier this morning. She denies chest pain shortness of breath. As well as neck  pain. She is not a candidate for tPA. Patient is warm to the touch, she has an elevated lactic acid of 4.08 and elevated white count of 17.5. She meets sepsis criteria of unknown source at this time. Broad-spectrum antibiotic initiated. Blood culture urine culture sent. Her head CT scan shows no acute intracranial pathology. I have consult with on-call neurologist Dr. Cyril Mourning who is aware patient agrees that she is not a tPA candidate. Patient will be admitted for further management. Care discussed with Dr. Jeraldine Loots.  12:11 AM Elevated troponin of 0.22. EKG without acute ischemic changes. Still having active chest pain. Aspirin given, along with morphine.  DOes report chest pain when asked.  I appreciate consultation from [facility], Dr. Allena Katz who will admit pt to step down.    12:19 AM Rectal temp is 103.1. Tylenol suppository given. Patient is currently on broad-spectrum antibiotic. Patient will be monitored closely.  CRITICAL  CARE Performed by: Fayrene Helper Total critical care time: 45 minutes Critical care time was exclusive of separately billable procedures and treating other patients. Critical care was necessary to treat or prevent imminent or life-threatening deterioration. Critical care was time spent personally by me on the following activities: development of treatment plan with patient and/or surrogate as well as nursing, discussions with consultants, evaluation of patient's response to treatment, examination of patient, obtaining history from patient or surrogate, ordering and performing treatments and interventions, ordering and review of laboratory studies, ordering and review of radiographic studies, pulse oximetry and re-evaluation of patient's condition.   Labs Review Labs Reviewed  COMPREHENSIVE METABOLIC PANEL - Abnormal; Notable for the following:    Glucose, Bld 221 (*)    Total Protein 6.4 (*)    Albumin 2.9 (*)    GFR calc non Af Amer 52 (*)    All other components  within normal limits  TROPONIN I - Abnormal; Notable for the following:    Troponin I 0.22 (*)    All other components within normal limits  CBC WITH DIFFERENTIAL/PLATELET - Abnormal; Notable for the following:    WBC 17.5 (*)    Hemoglobin 11.4 (*)    RDW 16.0 (*)    Neutro Abs 13.9 (*)    Monocytes Absolute 2.1 (*)    All other components within normal limits  I-STAT CG4 LACTIC ACID, ED - Abnormal; Notable for the following:    Lactic Acid, Venous 4.08 (*)    All other components within normal limits  CULTURE, BLOOD (ROUTINE X 2)  CULTURE, BLOOD (ROUTINE X 2)  URINE CULTURE  LIPASE, BLOOD  URINALYSIS, ROUTINE W REFLEX MICROSCOPIC (NOT AT [facility])  BRAIN NATRIURETIC PEPTIDE    Imaging Review Ct Head Wo Contrast  04/02/15  CLINICAL DATA:  Acute onset of altered mental status. Confusion. Initial encounter. EXAM: CT HEAD WITHOUT CONTRAST TECHNIQUE: Contiguous axial images were obtained from the base of the skull through the vertex without intravenous contrast. COMPARISON:  CT of the head performed 08/26/2014 FINDINGS: There is no evidence of acute infarction, mass lesion, or intra- or extra-axial hemorrhage on CT. Prominence of the ventricles and sulci reflects mild to moderate cortical volume loss. Mild cerebellar atrophy is noted. Chronic infarct is noted at the right occipital lobe, with associated encephalomalacia. Diffuse periventricular and subcortical white matter change likely reflects small vessel ischemic microangiopathy. Chronic ischemic change is noted at the basal ganglia bilaterally. The brainstem and fourth ventricle are within normal limits. No mass effect or midline shift is seen. There is no evidence of fracture; visualized osseous structures are unremarkable in appearance. The orbits are within normal limits. There is partial opacification of the right maxillary sinus with inspissated mucus. The remaining paranasal sinuses and mastoid air cells are well-aerated. No  significant soft tissue abnormalities are seen. IMPRESSION: 1. No acute intracranial pathology seen on CT. 2. Mild to moderate cortical volume loss and diffuse small vessel ischemic microangiopathy. 3. Chronic infarct at the right occipital lobe, with associated encephalomalacia. Chronic ischemic change at the basal ganglia bilaterally. 4. Partial opacification of the right maxillary sinus with inspissated mucus. Electronically Signed   By: Roanna Raider M.D.   On: 04/02/15 22:35   I have personally reviewed and evaluated these images and lab results as part of my medical decision-making.   EKG Interpretation   Date/Time:  Saturday 2015/04/02 22:05:19 EDT Ventricular Rate:  81 PR Interval:    QRS Duration: 130 QT Interval:  399  QTC Calculation: 463 R Axis:   -92 Text Interpretation:  Atrial fibrillation Nonspecific IVCD with LAD  Inferior infarct, old Probable anterolateral infarct, recent Atrial  fibrillation Non-specific intra-ventricular conduction delay Artifact  Abnormal ekg Confirmed by Gerhard Munch  MD [phone]) on 03/13/2015  10:09:11 PM      MDM   Final diagnoses:  Sepsis, due to unspecified organism [facility])  Elevated troponin  Pleuritic chest pain  Acute on chronic diastolic congestive heart failure (HCC)  Generalized weakness    BP 113/83 mmHg  Pulse 97  Temp(Src) 103.3 F (39.6 C) (Rectal)  Resp 35  SpO2 99%     Fayrene Helper, PA-C 2015-04-05 0019  Gerhard Munch, MD 03/19/15 [phone]

## 2015-03-18 ENCOUNTER — Inpatient Hospital Stay (HOSPITAL_COMMUNITY): Payer: Medicare Other

## 2015-03-18 DIAGNOSIS — F0150 Vascular dementia without behavioral disturbance: Secondary | ICD-10-CM

## 2015-03-18 DIAGNOSIS — Z888 Allergy status to other drugs, medicaments and biological substances status: Secondary | ICD-10-CM | POA: Diagnosis not present

## 2015-03-18 DIAGNOSIS — J189 Pneumonia, unspecified organism: Secondary | ICD-10-CM | POA: Diagnosis present

## 2015-03-18 DIAGNOSIS — R778 Other specified abnormalities of plasma proteins: Secondary | ICD-10-CM | POA: Diagnosis present

## 2015-03-18 DIAGNOSIS — R001 Bradycardia, unspecified: Secondary | ICD-10-CM | POA: Diagnosis present

## 2015-03-18 DIAGNOSIS — Z881 Allergy status to other antibiotic agents status: Secondary | ICD-10-CM | POA: Diagnosis not present

## 2015-03-18 DIAGNOSIS — E872 Acidosis, unspecified: Secondary | ICD-10-CM | POA: Diagnosis present

## 2015-03-18 DIAGNOSIS — Z823 Family history of stroke: Secondary | ICD-10-CM | POA: Diagnosis not present

## 2015-03-18 DIAGNOSIS — A419 Sepsis, unspecified organism: Principal | ICD-10-CM | POA: Diagnosis present

## 2015-03-18 DIAGNOSIS — Z87891 Personal history of nicotine dependence: Secondary | ICD-10-CM | POA: Diagnosis not present

## 2015-03-18 DIAGNOSIS — I5043 Acute on chronic combined systolic (congestive) and diastolic (congestive) heart failure: Secondary | ICD-10-CM | POA: Diagnosis present

## 2015-03-18 DIAGNOSIS — R7989 Other specified abnormal findings of blood chemistry: Secondary | ICD-10-CM

## 2015-03-18 DIAGNOSIS — H409 Unspecified glaucoma: Secondary | ICD-10-CM | POA: Diagnosis present

## 2015-03-18 DIAGNOSIS — R0781 Pleurodynia: Secondary | ICD-10-CM | POA: Diagnosis present

## 2015-03-18 DIAGNOSIS — R410 Disorientation, unspecified: Secondary | ICD-10-CM | POA: Diagnosis not present

## 2015-03-18 DIAGNOSIS — Z79899 Other long term (current) drug therapy: Secondary | ICD-10-CM | POA: Diagnosis not present

## 2015-03-18 DIAGNOSIS — R748 Abnormal levels of other serum enzymes: Secondary | ICD-10-CM | POA: Diagnosis present

## 2015-03-18 DIAGNOSIS — I480 Paroxysmal atrial fibrillation: Secondary | ICD-10-CM

## 2015-03-18 DIAGNOSIS — E785 Hyperlipidemia, unspecified: Secondary | ICD-10-CM

## 2015-03-18 DIAGNOSIS — G9340 Encephalopathy, unspecified: Secondary | ICD-10-CM | POA: Diagnosis present

## 2015-03-18 DIAGNOSIS — E7800 Pure hypercholesterolemia, unspecified: Secondary | ICD-10-CM | POA: Diagnosis present

## 2015-03-18 DIAGNOSIS — R29810 Facial weakness: Secondary | ICD-10-CM | POA: Diagnosis present

## 2015-03-18 DIAGNOSIS — I4891 Unspecified atrial fibrillation: Secondary | ICD-10-CM | POA: Diagnosis present

## 2015-03-18 DIAGNOSIS — I5022 Chronic systolic (congestive) heart failure: Secondary | ICD-10-CM | POA: Diagnosis not present

## 2015-03-18 DIAGNOSIS — L271 Localized skin eruption due to drugs and medicaments taken internally: Secondary | ICD-10-CM | POA: Diagnosis not present

## 2015-03-18 DIAGNOSIS — I10 Essential (primary) hypertension: Secondary | ICD-10-CM

## 2015-03-18 DIAGNOSIS — Z66 Do not resuscitate: Secondary | ICD-10-CM | POA: Diagnosis present

## 2015-03-18 DIAGNOSIS — H5441 Blindness, right eye, normal vision left eye: Secondary | ICD-10-CM | POA: Diagnosis present

## 2015-03-18 DIAGNOSIS — I110 Hypertensive heart disease with heart failure: Secondary | ICD-10-CM | POA: Diagnosis present

## 2015-03-18 DIAGNOSIS — Z7982 Long term (current) use of aspirin: Secondary | ICD-10-CM | POA: Diagnosis not present

## 2015-03-18 DIAGNOSIS — Z8673 Personal history of transient ischemic attack (TIA), and cerebral infarction without residual deficits: Secondary | ICD-10-CM | POA: Diagnosis not present

## 2015-03-18 DIAGNOSIS — T368X5A Adverse effect of other systemic antibiotics, initial encounter: Secondary | ICD-10-CM | POA: Diagnosis not present

## 2015-03-18 DIAGNOSIS — R600 Localized edema: Secondary | ICD-10-CM

## 2015-03-18 LAB — URINALYSIS, ROUTINE W REFLEX MICROSCOPIC
Glucose, UA: NEGATIVE mg/dL
HGB URINE DIPSTICK: NEGATIVE
Ketones, ur: NEGATIVE mg/dL
LEUKOCYTES UA: NEGATIVE
Nitrite: NEGATIVE
Protein, ur: NEGATIVE mg/dL
Specific Gravity, Urine: 1.021 (ref 1.005–1.030)
UROBILINOGEN UA: 1 mg/dL (ref 0.0–1.0)
pH: 5 (ref 5.0–8.0)

## 2015-03-18 LAB — BRAIN NATRIURETIC PEPTIDE: B Natriuretic Peptide: 1511.2 pg/mL — ABNORMAL HIGH (ref 0.0–100.0)

## 2015-03-18 LAB — I-STAT ARTERIAL BLOOD GAS, ED
ACID-BASE DEFICIT: 10 mmol/L — AB (ref 0.0–2.0)
BICARBONATE: 13.5 meq/L — AB (ref 20.0–24.0)
O2 Saturation: 95 %
PH ART: 7.328 — AB (ref 7.350–7.450)
PO2 ART: 90 mmHg (ref 80.0–100.0)
TCO2: 14 mmol/L (ref 0–100)
pCO2 arterial: 26.3 mmHg — ABNORMAL LOW (ref 35.0–45.0)

## 2015-03-18 MED ORDER — BRIMONIDINE TARTRATE-TIMOLOL 0.2-0.5 % OP SOLN: 1.0000 [drp] | Freq: Two times a day (BID) | OPHTHALMIC | Status: DC

## 2015-03-18 MED ORDER — BRIMONIDINE TARTRATE 0.2 % OP SOLN
1.0000 [drp] | Freq: Two times a day (BID) | OPHTHALMIC | Status: DC
  Filled 2015-03-18: qty 5

## 2015-03-18 MED ORDER — DORZOLAMIDE HCL-TIMOLOL MAL 2-0.5 % OP SOLN
1.0000 [drp] | Freq: Two times a day (BID) | OPHTHALMIC | Status: DC
  Filled 2015-03-18: qty 10

## 2015-03-18 MED ORDER — ACETAMINOPHEN 325 MG PO TABS: 650.0000 mg | ORAL_TABLET | ORAL | Status: DC | PRN

## 2015-03-18 MED ORDER — ASPIRIN 325 MG PO TABS: 325.0000 mg | ORAL_TABLET | Freq: Every day | ORAL | Status: DC

## 2015-03-18 MED ORDER — LATANOPROST 0.005 % OP SOLN
1.0000 [drp] | Freq: Every day | OPHTHALMIC | Status: DC
  Filled 2015-03-18: qty 2.5

## 2015-03-18 MED ORDER — PANTOPRAZOLE SODIUM 40 MG PO TBEC: 40.0000 mg | DELAYED_RELEASE_TABLET | Freq: Every day | ORAL | Status: DC

## 2015-03-18 MED ORDER — PIPERACILLIN-TAZOBACTAM 3.375 G IVPB
3.3750 g | Freq: Three times a day (TID) | INTRAVENOUS | Status: DC
  Filled 2015-03-18 (×2): qty 50

## 2015-03-18 MED ORDER — ACETAMINOPHEN 650 MG RE SUPP
650.0000 mg | Freq: Once | RECTAL | Status: AC
  Administered 2015-03-18: 650 mg via RECTAL
  Filled 2015-03-18: qty 1

## 2015-03-18 MED ORDER — DIPHENHYDRAMINE HCL 50 MG/ML IJ SOLN
25.0000 mg | Freq: Once | INTRAMUSCULAR | Status: AC
  Administered 2015-03-18: 25 mg via INTRAVENOUS
  Filled 2015-03-18: qty 1

## 2015-03-18 MED ORDER — ASPIRIN 300 MG RE SUPP
300.0000 mg | Freq: Every day | RECTAL | Status: DC
  Administered 2015-03-18: 300 mg via RECTAL
  Filled 2015-03-18 (×2): qty 1

## 2015-03-18 MED ORDER — ENOXAPARIN SODIUM 40 MG/0.4ML ~~LOC~~ SOLN
40.0000 mg | SUBCUTANEOUS | Status: DC
Start: 2015-03-18 — End: 2015-03-18

## 2015-03-18 MED ORDER — PRAVASTATIN SODIUM 20 MG PO TABS
20.0000 mg | ORAL_TABLET | Freq: Every day | ORAL | Status: DC
  Filled 2015-03-18: qty 1

## 2015-03-18 MED ORDER — ASPIRIN EC 81 MG PO TBEC: 81.0000 mg | DELAYED_RELEASE_TABLET | Freq: Every day | ORAL | Status: DC

## 2015-03-18 MED ORDER — ACETAMINOPHEN 650 MG RE SUPP: 650.0000 mg | RECTAL | Status: DC | PRN

## 2015-03-18 MED ORDER — ASPIRIN 81 MG PO CHEW: 324.0000 mg | CHEWABLE_TABLET | Freq: Once | ORAL | Status: DC

## 2015-03-18 MED ORDER — FAMOTIDINE IN NACL 20-0.9 MG/50ML-% IV SOLN
20.0000 mg | Freq: Two times a day (BID) | INTRAVENOUS | Status: DC
  Administered 2015-03-18: 20 mg via INTRAVENOUS
  Filled 2015-03-18: qty 50

## 2015-03-18 MED ORDER — ESCITALOPRAM OXALATE 10 MG PO TABS: 10.0000 mg | ORAL_TABLET | Freq: Every day | ORAL | Status: DC

## 2015-03-18 MED ORDER — VANCOMYCIN HCL 500 MG IV SOLR
500.0000 mg | Freq: Two times a day (BID) | INTRAVENOUS | Status: DC
  Filled 2015-03-18: qty 500

## 2015-03-18 MED ORDER — TIMOLOL MALEATE 0.5 % OP SOLN
1.0000 [drp] | Freq: Two times a day (BID) | OPHTHALMIC | Status: DC
  Filled 2015-03-18: qty 5

## 2015-03-18 MED ORDER — KETOROLAC TROMETHAMINE 15 MG/ML IJ SOLN
15.0000 mg | Freq: Once | INTRAMUSCULAR | Status: AC
  Administered 2015-03-18: 15 mg via INTRAVENOUS
  Filled 2015-03-18: qty 1

## 2015-03-18 MED ORDER — STROKE: EARLY STAGES OF RECOVERY BOOK
Freq: Once | Status: DC
  Filled 2015-03-18: qty 1

## 2015-03-18 MED ORDER — MORPHINE SULFATE (PF) 4 MG/ML IV SOLN
4.0000 mg | Freq: Once | INTRAVENOUS | Status: DC
  Filled 2015-03-18: qty 1

## 2015-03-19 LAB — URINE CULTURE

## 2015-03-20 NOTE — ED Notes (Signed)
Patel, MD at bedside.  

## 2015-03-20 NOTE — Progress Notes (Signed)
Family is at the bedside along with the doctor.

## 2015-03-20 NOTE — ED Notes (Signed)
Pt is dry-heaving. Verbal order from Jeraldine LootsLockwood, MD to give pt 4mg  Zofran.

## 2015-03-20 NOTE — ED Notes (Signed)
This RN transported pt back to the ED from MRI. Pt unable to have MRI, pt is unable to tolerate lying down. Allena KatzPatel, MD notified. He states to hold off on the MRI until morning.

## 2015-03-20 NOTE — ED Notes (Signed)
This RN transporting pt to 2H. Jasmine DecemberSharon, RN notified of new medications given, and that the pt is on the way upstairs.

## 2015-03-20 NOTE — Discharge Summary (Signed)
[facility] Death summary   Patient: Sydney Hensley   BJY:[id number]  DOB: 09/19/1927  PCP: Ezequiel KayserPERINI,MARK A, MD Date of admission: 04/09/2015  Date of death: 03/09/2015   Discharge Diagnoses:  Principal Problem:   Sepsis (HCC) Active Problems:   Atrial fibrillation (HCC)   Hypertension   CAP (community acquired pneumonia)   Hyperlipidemia   Dementia, vascular   Chronic systolic CHF (congestive heart failure) (HCC)   Pedal edema   Lactic acidosis   Elevated troponin  Hospital Course:  Patient presented with progressive shortness of breath, inability to take deep breath and confusion. She was also found to be having left-sided facial droop. patient was treated as sepsis in the ER, she was given one dose of IV Lasix after that with a concern for acute on chronic CHF. Neurology evaluated the patient and felt that the patient was out of TPA window, CT scan of the head was unremarkable, MRI was ordered. At the time of my evaluation the patient agitated and in moderate distress.  Patient was admitted to the hospital with a presumed diagnosis of sepsis most like secondary to community-acquired pneumonia and was treated with vancomycin and Zosyn per pharmacy, as well as for ongoing workup for stroke and acute on chronic CHF. Patient did develop mild erythema with vancomycin, and received Benadryl and Pepcid, infusion rate was slowed. Patient was then taken to MRI, but since the patient could not tolerate laying flat MRI was retimed in the morning. Patient was taken on the floor at which time her blood pressure was 94/69. As per the RN taking care of the patient, After arrival to the floor the patient was cleaned with wipes and on her first vital she was found to be hypotensive with systolic blood pressure in 70s. It was 63/41 and the patient started having bradycardia and then asystole. I was informed and the patient was evaluated bedside.  the patient was pronounced dead at 04:03  03/12/2015. Family was  informed and at bedside given the information about the hospital course.  Consultations:  Neurology   The results of significant diagnostics from this hospitalization (including imaging, microbiology, ancillary and laboratory) are listed below for reference.    Significant Diagnostic Studies: Ct Head Wo Contrast  04/09/2015  CLINICAL DATA:  Acute onset of altered mental status. Confusion. Initial encounter. EXAM: CT HEAD WITHOUT CONTRAST TECHNIQUE: Contiguous axial images were obtained from the base of the skull through the vertex without intravenous contrast. COMPARISON:  CT of the head performed 08/26/2014 FINDINGS: There is no evidence of acute infarction, mass lesion, or intra- or extra-axial hemorrhage on CT. Prominence of the ventricles and sulci reflects mild to moderate cortical volume loss. Mild cerebellar atrophy is noted. Chronic infarct is noted at the right occipital lobe, with associated encephalomalacia. Diffuse periventricular and subcortical white matter change likely reflects small vessel ischemic microangiopathy. Chronic ischemic change is noted at the basal ganglia bilaterally. The brainstem and fourth ventricle are within normal limits. No mass effect or midline shift is seen. There is no evidence of fracture; visualized osseous structures are unremarkable in appearance. The orbits are within normal limits. There is partial opacification of the right maxillary sinus with inspissated mucus. The remaining paranasal sinuses and mastoid air cells are well-aerated. No significant soft tissue abnormalities are seen. IMPRESSION: 1. No acute intracranial pathology seen on CT. 2. Mild to moderate cortical volume loss and diffuse small vessel ischemic microangiopathy. 3. Chronic infarct at the right occipital lobe, with associated encephalomalacia. Chronic  ischemic change at the basal ganglia bilaterally. 4. Partial opacification of the right maxillary sinus with  inspissated mucus. Electronically Signed   By: Roanna Raider M.D.   On: 03/06/2015 22:35   Dg Chest Port 1 View  03/12/2015  CLINICAL DATA:  Chest pain and shortness of breath EXAM: PORTABLE CHEST 1 VIEW COMPARISON:  Chest x-ray dated 08/28/2014. FINDINGS: Moderate cardiomegaly appears grossly stable. There is central pulmonary vascular congestion and bilateral interstitial edema, with probable bibasilar alveolar pulmonary edema, consistent with congestive heart failure. Suspect additional atelectasis at the left lung base. Also suspect small bilateral pleural effusions. IMPRESSION: Cardiomegaly with central pulmonary vascular congestion and perihilar edema suggesting congestive heart failure/ volume overload. More confluent opacities at each lung base are probably due to a combination of confluent alveolar pulmonary edema, atelectasis and small pleural effusions. Electronically Signed   By: Bary Richard M.D.   On: 03/15/2015 23:24    Microbiology: No results found for this or any previous visit (from the past 240 hour(s)).   Labs: Basic Metabolic Panel:  Recent Labs Lab 03/11/2015 2241  NA 136  K 4.2  CL 101  CO2 22  GLUCOSE 221*  BUN 15  CREATININE 0.96  CALCIUM 9.0   Liver Function Tests:  Recent Labs Lab 02/27/2015 2241  AST 31  ALT 19  ALKPHOS 80  BILITOT 0.9  PROT 6.4*  ALBUMIN 2.9*    Recent Labs Lab 02/26/2015 2241  LIPASE 18   No results for input(s): AMMONIA in the last 168 hours. CBC:  Recent Labs Lab 03/07/2015 2241  WBC 17.5*  NEUTROABS 13.9*  HGB 11.4*  HCT 37.8  MCV 92.9  PLT 239   Cardiac Enzymes:  Recent Labs Lab 03/09/2015 2241  TROPONINI 0.22*   BNP: BNP (last 3 results)  Recent Labs  08/28/14 0545 08/30/14 0505 03/28/2015 0020  BNP 1594.9* 622.4* 1511.2*    Signed:  Lynden Oxford  [facility] 28-Mar-2015, 4:52 AM

## 2015-03-20 NOTE — Consult Note (Signed)
Referring Physician: ED    Chief Complaint: generalized weakness and left face droop  HPI:                                                                                                                                         Sydney Hensley is an 81 y.o. female with a past medical history significant for HTN, HLD, chronic congestive heart failure, atrial fibrillation off anticoagulants due to GI bleeding on Pradaxa, left PCA territory infarct, right eye blindness, and dementia, brought in for evaluation of the above state symptoms. Family is at the bedside and tells me that at baseline she is able to walk and interact with family members, but this morning she was not herself, confused, was weak all over, complaining of chest pain and neck pain, with laborious breathing and they decided to bring her to the ED.  At some point the ED staff noted droopiness of the left and neurology was asked to see the patient. Of note, family reports no noticing any facial asymmetry. No recent fever, chills. Denies HA, vertigo, double vision, or having unilateral weakness. CT brain was personally reviewed and showed no acute abnormality. She is DNR/DNI.   Date last known well: 02/27/2015 Time last known well: 10 am  tPA Given: no, late presentation   Past Medical History  Diagnosis Date  . Glaucoma   . Hypertension   . Dysrhythmia   . Atrial fibrillation ([place])   . CHF (congestive heart failure) (HCC)     Diastolic dysfunction  . Blind right eye   . High cholesterol   . Pneumonia "several times"  . Cellulitis X 2    "legs"  . Stroke [facility])     "peripheral vision in left eye only ater her strokes; major stroke 12/18/2013; small strokes since then" (08/25/2014)  . Depression   . Dementia     "since stroke in 12/2013"  . Ventricular fibrillation ([place]) 08/25/2014 in ER    Past Surgical History  Procedure Laterality Date  . Abdominal hysterectomy  1950's    Family History  Problem Relation Age of Onset   . Cancer Father   . CVA Mother    Social History:  reports that she has quit smoking. Her smoking use included Cigarettes. She has never used smokeless tobacco. She reports that she does not drink alcohol or use illicit drugs. Family history: no epilepsy, brain tumor, or brain aneurysm Allergies:  Allergies  Allergen Reactions  . Lisinopril Cough    Medications:  I have reviewed the patient's current medications.  ROS: unable to talk due to mental status.                                                                                                                                      History obtained from chart review and family  Physical exam:  Constitutional: well developed, pleasant female in no apparent distress. Blood pressure 121/78, pulse 96, temperature 103.3 F (39.6 C), temperature source Rectal, resp. rate 37, SpO2 99 %. Eyes: no jaundice or exophthalmos.  Head: normocephalic. Neck: supple, no bruits, no JVD. Cardiac: no murmurs. Lungs: clear. Abdomen: soft, no tender, no mass. Extremities: no edema, clubbing, or cyanosis.  Skin: no rash Neurologic Examination:                                                                                                      General: NAD Mental Status: Alert and awake, oriented x 4. Speech fluent without evidence of aphasia.  Able to follow 3 step commands without difficulty. Cranial Nerves: II: Discs flat bilaterally; Can not see out of the right eye, pupils equal, round, reactive to light and accommodation III,IV, VI: ptosis not present, extra-ocular motions intact bilaterally V,VII: smile symmetric, facial light touch sensation normal bilaterally VIII: hearing normal bilaterally IX,X: uvula rises symmetrically XI: bilateral shoulder shrug  XII: midline tongue extension without atrophy or  fasciculations Motor: Moves all extremities symmetrically Tone and bulk:normal tone throughout; no atrophy noted Sensory: withdraws to painful stimuli Deep Tendon Reflexes:  1 all over Plantars: Right: downgoing   Left: downgoing Cerebellar: normal finger-to-nose, unable to test heel-to-shin test Gait:  Unable top test due to multiple leads, mental status    Results for orders placed or performed during the hospital encounter of 02/17/2015 (from the past 48 hour(s))  Comprehensive metabolic panel     Status: Abnormal   Collection Time: 02/27/2015 10:41 PM  Result Value Ref Range   Sodium 136 135 - 145 mmol/L   Potassium 4.2 3.5 - 5.1 mmol/L   Chloride 101 101 - 111 mmol/L   CO2 22 22 - 32 mmol/L   Glucose, Bld 221 (H) 65 - 99 mg/dL   BUN 15 6 - 20 mg/dL   Creatinine, Ser 0.96 0.44 - 1.00 mg/dL   Calcium 9.0 8.9 - 10.3 mg/dL   Total Protein 6.4 (L) 6.5 - 8.1 g/dL   Albumin 2.9 (L) 3.5 - 5.0 g/dL   AST 31 15 - 41 U/L  ALT 19 14 - 54 U/L   Alkaline Phosphatase 80 38 - 126 U/L   Total Bilirubin 0.9 0.3 - 1.2 mg/dL   GFR calc non Af Amer 52 (L) >60 mL/min   GFR calc Af Amer >60 >60 mL/min    Comment: (NOTE) The eGFR has been calculated using the CKD EPI equation. This calculation has not been validated in all clinical situations. eGFR's persistently <60 mL/min signify possible Chronic Kidney Disease.    Anion gap 13 5 - 15  Lipase, blood     Status: None   Collection Time: 03/11/2015 10:41 PM  Result Value Ref Range   Lipase 18 11 - 51 U/L    Comment: Please note change in reference range.  Troponin I     Status: Abnormal   Collection Time: 02/20/2015 10:41 PM  Result Value Ref Range   Troponin I 0.22 (H) <0.031 ng/mL    Comment:        PERSISTENTLY INCREASED TROPONIN VALUES IN THE RANGE OF 0.04-0.49 ng/mL CAN BE SEEN IN:       -UNSTABLE ANGINA       -CONGESTIVE HEART FAILURE       -MYOCARDITIS       -CHEST TRAUMA       -ARRYHTHMIAS       -LATE PRESENTING MYOCARDIAL  INFARCTION       -COPD   CLINICAL FOLLOW-UP RECOMMENDED.   CBC with Differential     Status: Abnormal   Collection Time: 03/09/2015 10:41 PM  Result Value Ref Range   WBC 17.5 (H) 4.0 - 10.5 K/uL   RBC 4.07 3.87 - 5.11 MIL/uL   Hemoglobin 11.4 (L) 12.0 - 15.0 g/dL   HCT 37.8 36.0 - 46.0 %   MCV 92.9 78.0 - 100.0 fL   MCH 28.0 26.0 - 34.0 pg   MCHC 30.2 30.0 - 36.0 g/dL   RDW 16.0 (H) 11.5 - 15.5 %   Platelets 239 150 - 400 K/uL   Neutrophils Relative % 78 %   Neutro Abs 13.9 (H) 1.7 - 7.7 K/uL   Lymphocytes Relative 8 %   Lymphs Abs 1.3 0.7 - 4.0 K/uL   Monocytes Relative 12 %   Monocytes Absolute 2.1 (H) 0.1 - 1.0 K/uL   Eosinophils Relative 1 %   Eosinophils Absolute 0.1 0.0 - 0.7 K/uL   Basophils Relative 1 %   Basophils Absolute 0.1 0.0 - 0.1 K/uL  I-Stat CG4 Lactic Acid, ED     Status: Abnormal   Collection Time: 03/06/2015 10:58 PM  Result Value Ref Range   Lactic Acid, Venous 4.08 (HH) 0.5 - 2.0 mmol/L   Comment NOTIFIED PHYSICIAN    Ct Head Wo Contrast  03/15/2015  CLINICAL DATA:  Acute onset of altered mental status. Confusion. Initial encounter. EXAM: CT HEAD WITHOUT CONTRAST TECHNIQUE: Contiguous axial images were obtained from the base of the skull through the vertex without intravenous contrast. COMPARISON:  CT of the head performed 08/26/2014 FINDINGS: There is no evidence of acute infarction, mass lesion, or intra- or extra-axial hemorrhage on CT. Prominence of the ventricles and sulci reflects mild to moderate cortical volume loss. Mild cerebellar atrophy is noted. Chronic infarct is noted at the right occipital lobe, with associated encephalomalacia. Diffuse periventricular and subcortical white matter change likely reflects small vessel ischemic microangiopathy. Chronic ischemic change is noted at the basal ganglia bilaterally. The brainstem and fourth ventricle are within normal limits. No mass effect or midline shift is seen. There is  no evidence of fracture;  visualized osseous structures are unremarkable in appearance. The orbits are within normal limits. There is partial opacification of the right maxillary sinus with inspissated mucus. The remaining paranasal sinuses and mastoid air cells are well-aerated. No significant soft tissue abnormalities are seen. IMPRESSION: 1. No acute intracranial pathology seen on CT. 2. Mild to moderate cortical volume loss and diffuse small vessel ischemic microangiopathy. 3. Chronic infarct at the right occipital lobe, with associated encephalomalacia. Chronic ischemic change at the basal ganglia bilaterally. 4. Partial opacification of the right maxillary sinus with inspissated mucus. Electronically Signed   By: Garald Balding M.D.   On: 02/27/2015 22:35   Dg Chest Port 1 View  03/16/2015  CLINICAL DATA:  Chest pain and shortness of breath EXAM: PORTABLE CHEST 1 VIEW COMPARISON:  Chest x-ray dated 08/28/2014. FINDINGS: Moderate cardiomegaly appears grossly stable. There is central pulmonary vascular congestion and bilateral interstitial edema, with probable bibasilar alveolar pulmonary edema, consistent with congestive heart failure. Suspect additional atelectasis at the left lung base. Also suspect small bilateral pleural effusions. IMPRESSION: Cardiomegaly with central pulmonary vascular congestion and perihilar edema suggesting congestive heart failure/ volume overload. More confluent opacities at each lung base are probably due to a combination of confluent alveolar pulmonary edema, atelectasis and small pleural effusions. Electronically Signed   By: Franki Cabot M.D.   On: 02/19/2015 23:24    Assessment: 81 y.o. female with vague symptoms of generalized weakness, chest and neck pain, some confusion, noted to have left face droop on initial exam in the ED which is not obvious on my assessment at this moment. CT brain is unremarkable for acute abnormality. Patient certainly has multiple risk factors for stroke and can not  ruled out stroke. Recommend MRI brain and further stroke work up if MRI +. Will follow up.  Stroke Risk Factors - age, HTN, HLD, chronic congestive heart failure, atrial fibrillation, prior stroke  Dorian Pod, MD [facility] [phone]  Mar 29, 2015, 12:54 AM

## 2015-03-20 NOTE — H&P (Signed)
[facility] History and Physical  Patient: Sydney Hensley  MRN: [id number]  DOB: 02/21/1928  DOS: the patient was seen and examined on 03-24-15 PCP: Ezequiel Kayser, MD  Referring physician: Dr. Jeraldine Loots Chief Complaint: Shortness of breath and confusion with facial droop  HPI: Sydney Hensley is a 81 y.o. female with Past medical history of atrial fibrillation not on any anticoagulation due to GI bleed, chronic anemia, chronic diastolic dysfunction, prior CVA, dementia, hypertension, dyslipidemia. The patient presented with complaints of confusion with slurred speech. The family was at the bedside who was providing most of the history. At her baseline the patient is able to walwith support. Patient has been having some cough and generalized weakness over last few days. In the afternoon the patient started complaining of significant weakness also having complaints of pain in the chest as well as neck. Next and she also started having complaints of difficulty breathing. There was not fall trauma or injury reported. There was no choking episode reported. Patient did not have any diarrhea or constipation there was no nausea or vomiting. There is no increased urinary frequency. Plan the patient was being evaluated in the ED he also found that she did have some droopiness of the left face and therefore neurology was consulted. At the time of my evaluation patient's primary complaint was she was not able to take a deep breath, she was also feeling warm. She denied any complaints of pain anywhere.   The patient is coming from SNF.  At her baseline ambulates with support. And is dependent for most of her ADL;does not manages her medication on her own.  Review of Systems: as mentioned in the history of present illness.  A comprehensive review of the other systems is negative.  Past Medical History  Diagnosis Date  . Glaucoma   . Hypertension   . Dysrhythmia   . Atrial fibrillation  (HCC)   . CHF (congestive heart failure) (HCC)     Diastolic dysfunction  . Blind right eye   . High cholesterol   . Pneumonia "several times"  . Cellulitis X 2    "legs"  . Stroke [facility])     "peripheral vision in left eye only ater her strokes; major stroke 12/18/2013; small strokes since then" (08/25/2014)  . Depression   . Dementia     "since stroke in 12/2013"  . Ventricular fibrillation (HCC) 08/25/2014 in ER   Past Surgical History  Procedure Laterality Date  . Abdominal hysterectomy  1950's   Social History:  reports that she has quit smoking. Her smoking use included Cigarettes. She has never used smokeless tobacco. She reports that she does not drink alcohol or use illicit drugs.  Allergies  Allergen Reactions  . Lisinopril Cough    Family History  Problem Relation Age of Onset  . Cancer Father   . CVA Mother     Prior to Admission medications   Medication Sig Start Date End Date Taking? Authorizing Provider  aspirin EC 81 MG tablet Take 81 mg by mouth daily.   Yes Historical Provider, MD  bisacodyl (DULCOLAX) 5 MG EC tablet Take 5 mg by mouth daily as needed for moderate constipation.   Yes Historical Provider, MD  brimonidine-timolol (COMBIGAN) 0.2-0.5 % ophthalmic solution Place 1 drop into both eyes every 12 (twelve) hours.   Yes Historical Provider, MD  docusate sodium (COLACE) 100 MG capsule Take 100 mg by mouth daily as needed for mild constipation.   Yes Historical Provider, MD  dorzolamide-timolol (COSOPT) 22.3-6.8 MG/ML ophthalmic solution Place 1 drop into both eyes 2 (two) times daily.   Yes Historical Provider, MD  escitalopram (LEXAPRO) 20 MG tablet Take 10 mg by mouth at bedtime.   Yes Historical Provider, MD  furosemide (LASIX) 40 MG tablet Take 1 tablet (40 mg total) by mouth daily. Patient taking differently: Take 20 mg by mouth daily.  08/31/14  Yes Kathlen Mody, MD  latanoprost (XALATAN) 0.005 % ophthalmic solution Place 1 drop into both eyes at bedtime.   01/21/13  Yes Historical Provider, MD  losartan (COZAAR) 50 MG tablet Take 1 tablet (50 mg total) by mouth 2 (two) times daily. Patient taking differently: Take 25 mg by mouth daily.  03/27/14  Yes Rollene Rotunda, MD  metoprolol succinate (TOPROL-XL) 25 MG 24 hr tablet Take 1 tablet (25 mg total) by mouth 2 (two) times daily. 08/31/14  Yes Kathlen Mody, MD  pantoprazole (PROTONIX) 40 MG tablet Take 1 tablet (40 mg total) by mouth daily at 6 (six) AM. 08/31/14  Yes Kathlen Mody, MD  potassium chloride SA (K-DUR,KLOR-CON) 20 MEQ tablet Take 20 mEq by mouth daily.  02/23/15  Yes Historical Provider, MD  pravastatin (PRAVACHOL) 20 MG tablet Take 20 mg by mouth at bedtime.   Yes Historical Provider, MD    Physical Exam: Filed Vitals:   04/08/15 0317 04/08/2015 0330 2015/04/08 0351 2015/04/08 0352  BP: 63/41  Pulse:  85    Temp:      TempSrc:      Resp: 50 49    SpO2:  95%      General: Alert, Awake and Oriented to Place and Person. Appear in moderate distress Eyes: PERRL, redness underneath both eyelids. ENT: Oral Mucosa shows results of food debries on the soft palate, dry. Neck: Difficult to assess  JVD Cardiovascular: S1 and S2 Present, no Murmur, Peripheral Pulses Present Respiratory: Bilateral Air entry equal and Decreased,  Faint basal Crackles, no wheezes Abdomen: Bowel Sound present, Soft and no tenderness Skin: no Rash Extremities: Bilateral Pedal edema, no calf tenderness Neurologic: Mental status AAOx3, speech Dysarthria, attention normal,  Cranial Nerves PERRL, EOM normal and present,  left facial droop Motor strength bilateral equal strength 4/5,  Sensation present to light touch,  babinski equivocal,  Cerebellar test normal finger nose finger.  Labs on Admission:  CBC:  Recent Labs Lab 03/16/2015 2241  WBC 17.5*  NEUTROABS 13.9*  HGB 11.4*  HCT 37.8  MCV 92.9  PLT 239    CMP     Component Value Date/Time   NA 136 02/18/2015 2241   K 4.2 02/20/2015  2241   CL 101 03/13/2015 2241   CO2 22 02/21/2015 2241   GLUCOSE 221* 02/26/2015 2241   BUN 15 03/19/2015 2241   CREATININE 0.96 03/15/2015 2241   CREATININE 1.14* 12/12/2013 1032   CALCIUM 9.0 02/27/2015 2241   PROT 6.4* 03/09/2015 2241   ALBUMIN 2.9* 03/09/2015 2241   AST 31 02/22/2015 2241   ALT 19 02/25/2015 2241   ALKPHOS 80 03/16/2015 2241   BILITOT 0.9 02/21/2015 2241   GFRNONAA 52* 02/21/2015 2241   GFRAA >60 02/28/2015 2241     Recent Labs Lab 03/11/2015 2241  TROPONINI 0.22*   BNP (last 3 results)  Recent Labs  08/28/14 0545 08/30/14 0505 04-08-2015 0020  BNP 1594.9* 622.4* 1511.2*    ProBNP (last 3 results) No results for input(s): PROBNP in the last 8760 hours.   Radiological Exams on Admission: Ct Head  Wo Contrast  03/10/2015  CLINICAL DATA:  Acute onset of altered mental status. Confusion. Initial encounter. EXAM: CT HEAD WITHOUT CONTRAST TECHNIQUE: Contiguous axial images were obtained from the base of the skull through the vertex without intravenous contrast. COMPARISON:  CT of the head performed 08/26/2014 FINDINGS: There is no evidence of acute infarction, mass lesion, or intra- or extra-axial hemorrhage on CT. Prominence of the ventricles and sulci reflects mild to moderate cortical volume loss. Mild cerebellar atrophy is noted. Chronic infarct is noted at the right occipital lobe, with associated encephalomalacia. Diffuse periventricular and subcortical white matter change likely reflects small vessel ischemic microangiopathy. Chronic ischemic change is noted at the basal ganglia bilaterally. The brainstem and fourth ventricle are within normal limits. No mass effect or midline shift is seen. There is no evidence of fracture; visualized osseous structures are unremarkable in appearance. The orbits are within normal limits. There is partial opacification of the right maxillary sinus with inspissated mucus. The remaining paranasal sinuses and mastoid air cells  are well-aerated. No significant soft tissue abnormalities are seen. IMPRESSION: 1. No acute intracranial pathology seen on CT. 2. Mild to moderate cortical volume loss and diffuse small vessel ischemic microangiopathy. 3. Chronic infarct at the right occipital lobe, with associated encephalomalacia. Chronic ischemic change at the basal ganglia bilaterally. 4. Partial opacification of the right maxillary sinus with inspissated mucus. Electronically Signed   By: Roanna Raider M.D.   On: 03/12/2015 22:35   Dg Chest Port 1 View  03/06/2015  CLINICAL DATA:  Chest pain and shortness of breath EXAM: PORTABLE CHEST 1 VIEW COMPARISON:  Chest x-ray dated 08/28/2014. FINDINGS: Moderate cardiomegaly appears grossly stable. There is central pulmonary vascular congestion and bilateral interstitial edema, with probable bibasilar alveolar pulmonary edema, consistent with congestive heart failure. Suspect additional atelectasis at the left lung base. Also suspect small bilateral pleural effusions. IMPRESSION: Cardiomegaly with central pulmonary vascular congestion and perihilar edema suggesting congestive heart failure/ volume overload. More confluent opacities at each lung base are probably due to a combination of confluent alveolar pulmonary edema, atelectasis and small pleural effusions. Electronically Signed   By: Bary Richard M.D.   On: 02/22/2015 23:24   EKG: Independently reviewed. atrial fibrillation, rate controlled.  Assessment/Plan 1. Sepsis [facility]) The patient is presenting with complaint of shortness of breath as well as weakness. Next time she is also found to be having left-sided facial droop. CT scan of the head is unremarkable. Currently she has leukocytosis, tachypnea, lactic acidosis with probable source of infection in the lung. That she is meeting the criteria for sepsis and she will be admitted in the step down unit. During her ER course patient has received 500 mL bolus followed by 60 mg of  Lasix with a concern for acute on chronic systolic CHF due to elevated BNP and troponin. At present her hemodynamics are stable I would recheck her lactic acid level and if the lactic acid level remains elevated she will be given further IV hydration with close monitoring of her ins and outs. Currently she'll be treated with broad-spectrum antibiotics vancomycin and Zosyn per pharmacy. We will also check urine, and full sepsis workup including procalcitonin level.  2. Acute encephalopathy. Possible TIA. The patient is also presenting with episodes of confusion as well as left-sided facial droop. Neurology has evaluated the patient and recommend further workup. Currently the patient will remain nothing by mouth pending stroke evaluation. We will get PTOT and speech evaluation in the morning. Check MRI of  the brain. Check lipid profile and hemoglobin A1c. We'll give her suppository aspirin.  3  Atrial fibrillation (HCC) patient not on anticoagulation due to recurrent GI bleed. We will continue using aspirin at present. Currently she is rate controlled.  4  Hypertension  blood pressure is currently stable. Continue close monitoring.  5  Hyperlipidemia  continue Pravachol.  6  Dementia, vascular  patient is currently agitated. We'll continue close monitoring.  7  Chronic systolic CHF (congestive heart failure) (HCC) patient has received IV Lasix in the ER  her BNP is elevated but not elevated as much as in her prior admission. She does have bilateral pedal edema suggesting possible CHF. We will continue close monitoring of her ins and outs and daily weight. Avoiding further hydration until lactic acid recheck his back.  8  Pedal edema  possibility of DVT cannot be ruled out. Would check a d-dimer. If elevated will check lower extremity Doppler in the morning.  9  Elevated troponin  patient does of mild elevation of her troponin. Would check serial troponin and echo program in  the morning. Next and patient is already given aspirin.  Nutrition: Nothing by mouth pending stroke evaluation  DVT Prophylaxis: subcutaneous Heparin  Advance goals of care discussion: DNR/DNI as per my discussion with patient's nephew and his wife who are the primary caregiver for the patient   Consults: Neurology   Family Communication: family was present at bedside, opportunity was given to ask question and all questions were answered satisfactorily at the time of interview. Disposition: Admitted as inpatient, step-down unit.  Addendum: After evaluating the patient the, RN taking care of the patient, call me to inform about the redness underneath the eye is spreading and it was not present before arrival. Most likely vancomycin-induced redness  Recommended her to reduce the rate of infusion of vancomycin after discussing with pharmacy. Also since the patient has not passed swallowing evaluation will give her Benadryl and Pepcid IV.  Addendum: Patient was significantly agitated in the MRI due to inability lie down flat due to shortness of breath. Requested RN to re-time the MRI in the morning.  Author: Lynden OxfordPranav Karri Kallenbach, MD [facility] Pager: [phone] 03/16/2015  If 7PM-7AM, please contact night-coverage [email or web address] Password [email or web address]

## 2015-03-20 NOTE — Progress Notes (Signed)
Patient arrived to room 2H17 from the ED at 0352. She was cool, clammy and pale. We were getting her settled in the bed and hooked up to our monitors and she stated, "lay me flat". Her initial BP was 71/50 and we re-took it and it was 63/41. HR was 56. Elink responded via camera. Patient started to brady down to asystole and she stopped breathing. Elink confirmed that she was a full DNR. Family was called and notified that patient was not doing well and that she probably would not live much longer. Time of death was 0403 am. Attending physician was notified and he responded to the bedside and called the family. Two nurses verified that there was no apical heart beat.

## 2015-03-20 NOTE — ED Notes (Signed)
Per EMS, pt was last seen normal this AM. Pt reporting weakness all over for at least 4 hours. Pt's family reported the pt getting a flu shot 3 weeks ago. Pt with hx of a-fib, but EMS reports a new RBBB on their EKG. Pt with hx of htn. Pt with slurred speech and left sided facial droop on initial assessment.

## 2015-03-20 NOTE — Progress Notes (Signed)
abg collected, RN at bedside

## 2015-03-20 NOTE — ED Notes (Signed)
MRI states that they are ready for the pt.   Sydney DecemberSharon, RN on 2H notified on the pt's plan of care.

## 2015-03-20 NOTE — ED Notes (Signed)
This Rn transported pt to MRI.

## 2015-03-20 DEATH — deceased

## 2015-03-23 LAB — CULTURE, BLOOD (ROUTINE X 2)
CULTURE: NO GROWTH
Culture: NO GROWTH

## 2015-04-17 ENCOUNTER — Other Ambulatory Visit: Payer: Self-pay | Admitting: Cardiology

## 2016-04-22 IMAGING — CT CT HEAD W/O CM
1 series · 16 of 30 positions shown, 20 images · non-contrast
Comparison: None.

CLINICAL DATA: Altered mental status, failure to thrive

EXAM:
CT HEAD WITHOUT CONTRAST
TECHNIQUE: Contiguous axial images were obtained from the base of the skull
through the vertex without intravenous contrast.

[Series 2: head 5.0 h30s · axial · 0.44mm/px · z∈[-85,+70]mm · 16 of 35 slices shown, 20 images]
[im 2/35  brain]
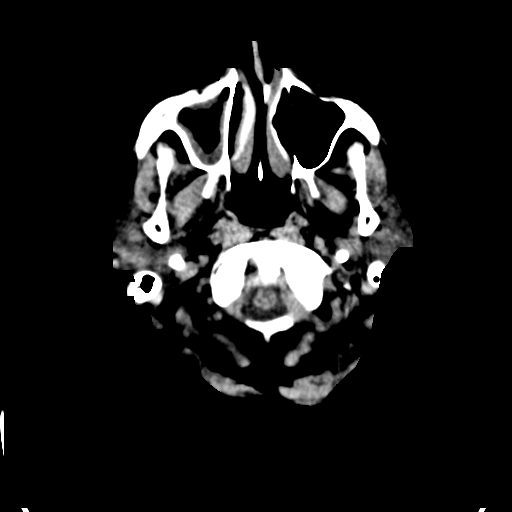
[im 2/35  bone]
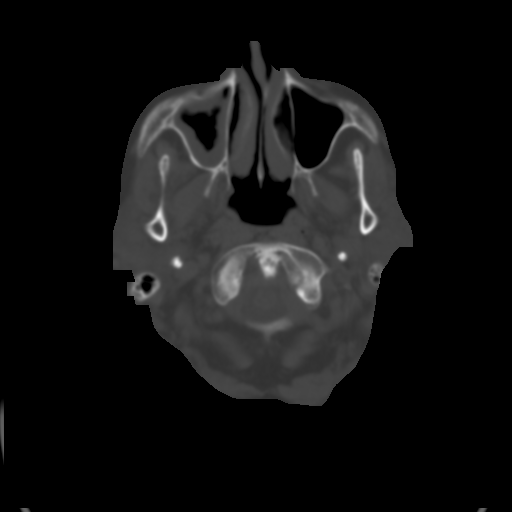
[im 4/35  brain]
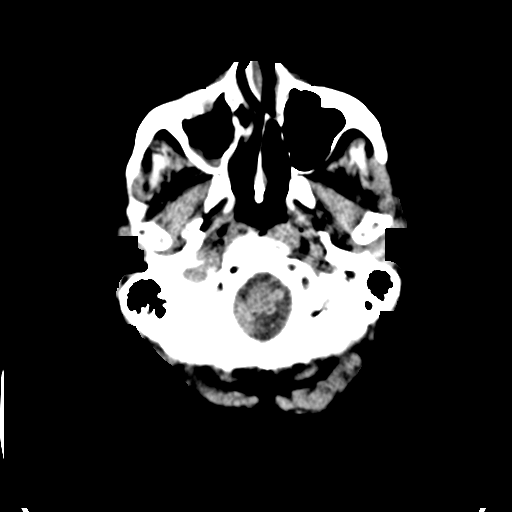
[im 6/35  brain]
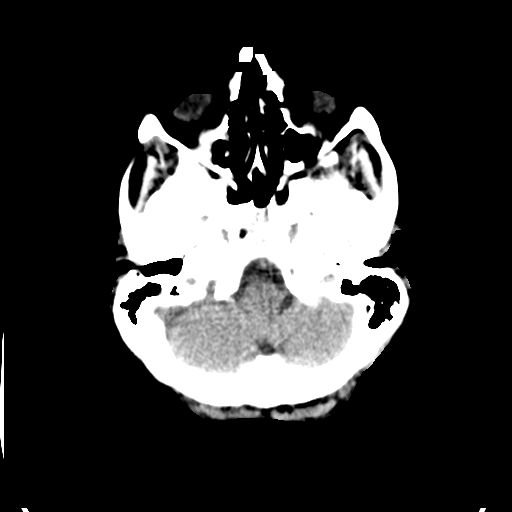
[im 9/35  brain]
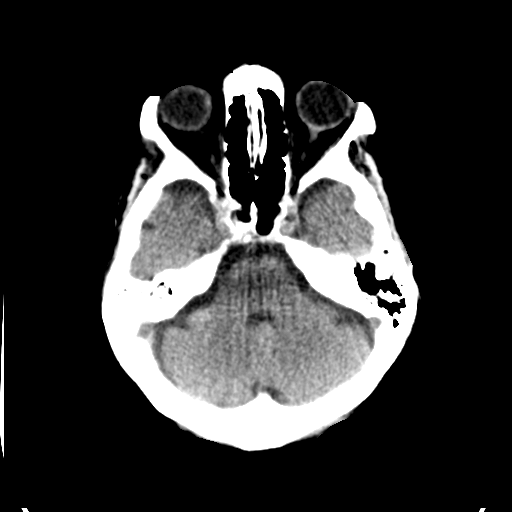
[im 10/35  brain]
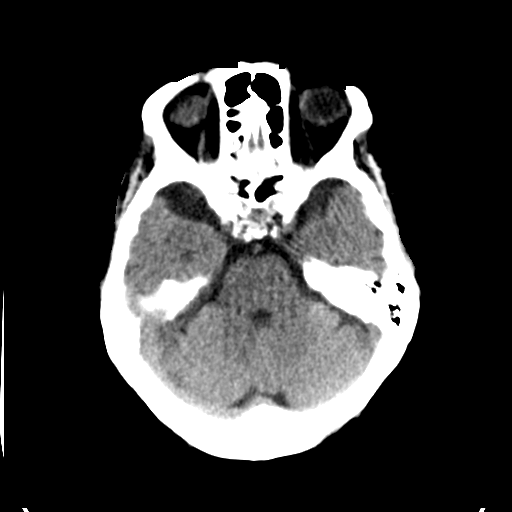
[im 10/35  bone]
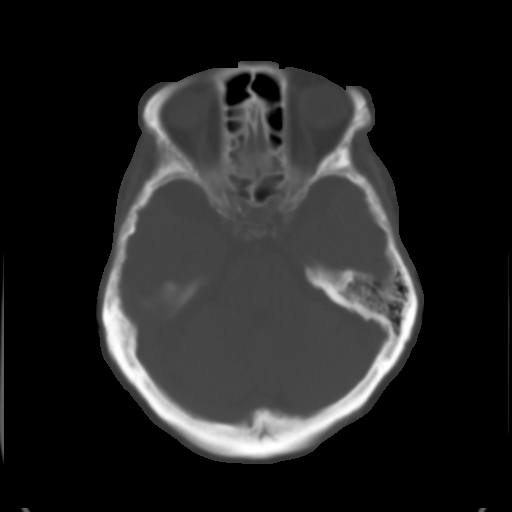
[im 12/35  brain]
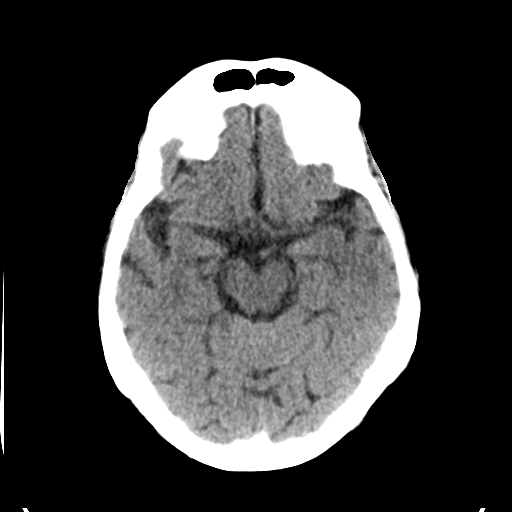
[im 15/35  brain]
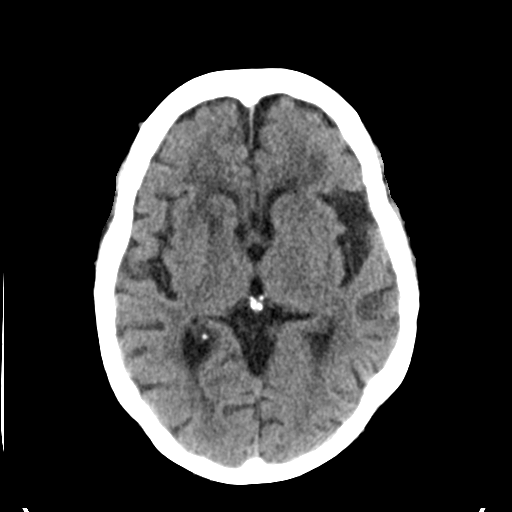
[im 17/35  brain]
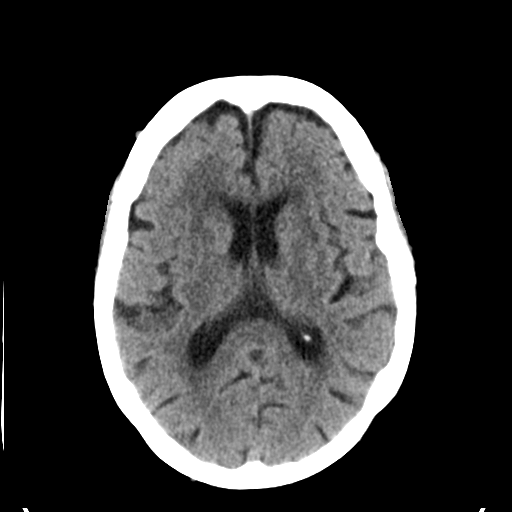
[im 18/35  brain]
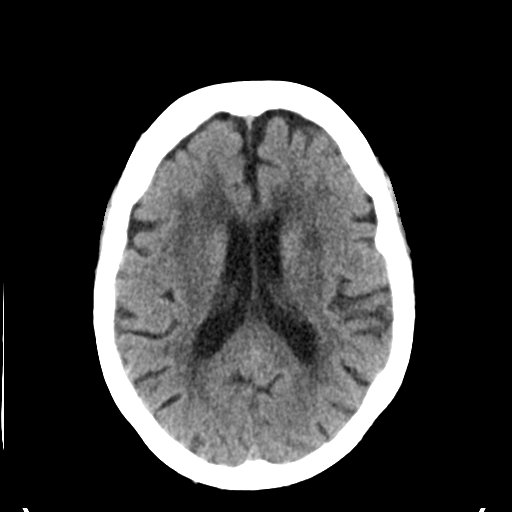
[im 18/35  bone]
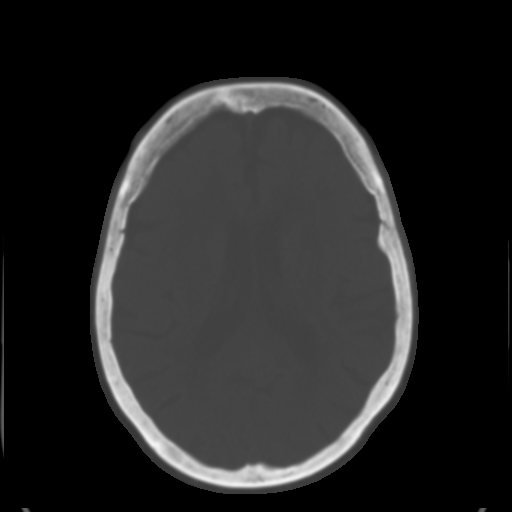
[im 20/35  brain]
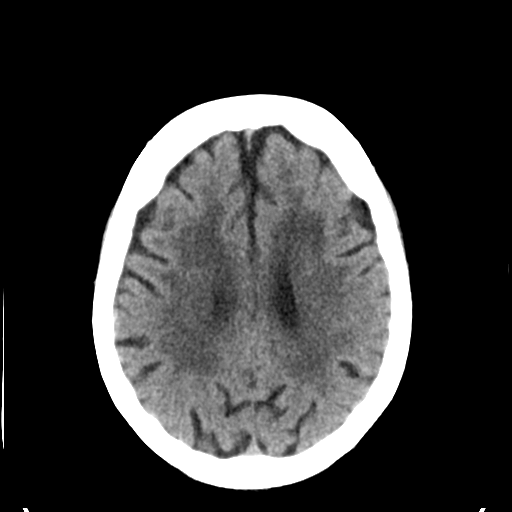
[im 23/35  brain]
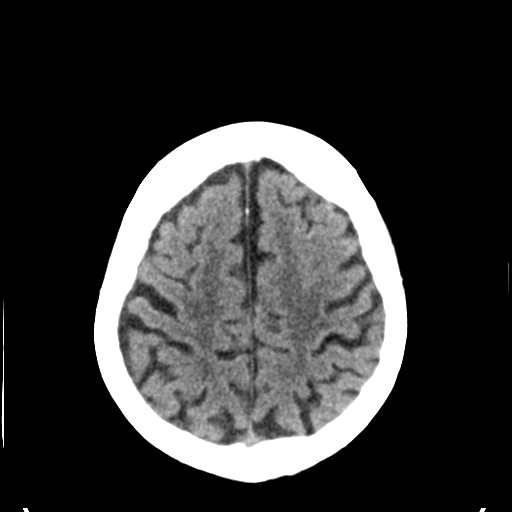
[im 25/35  brain]
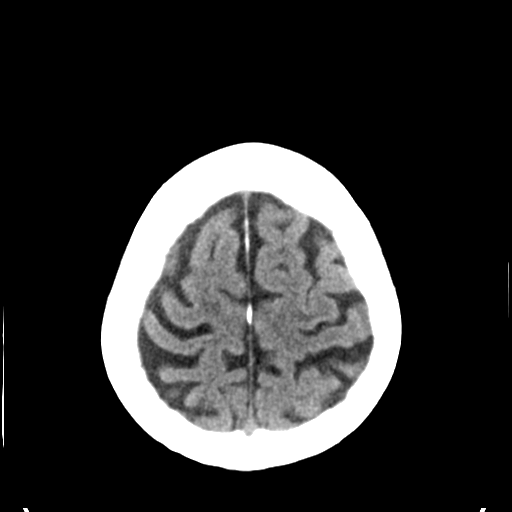
[im 26/35  brain]
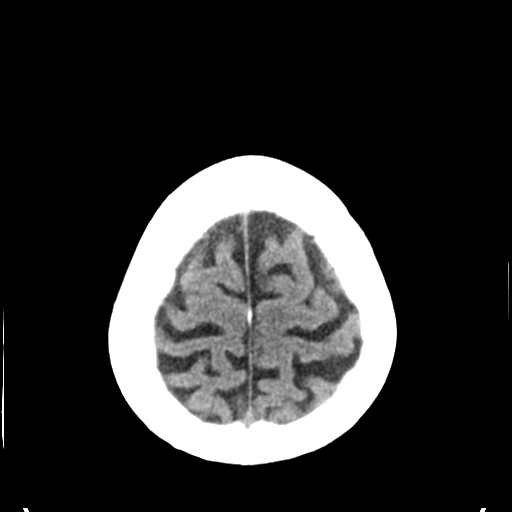
[im 26/35  bone]
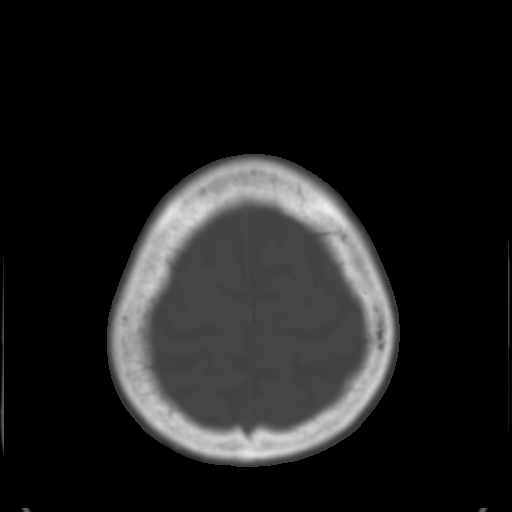
[im 29/35  brain]
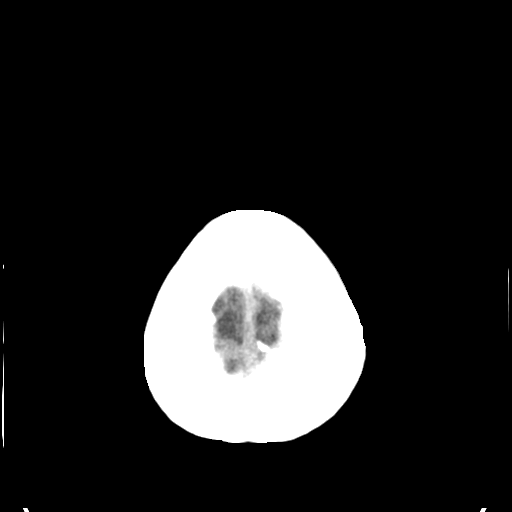
[im 31/35  brain]
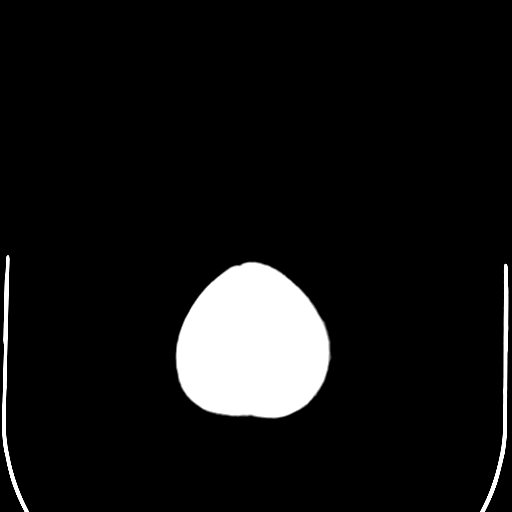
[im 33/35  brain]
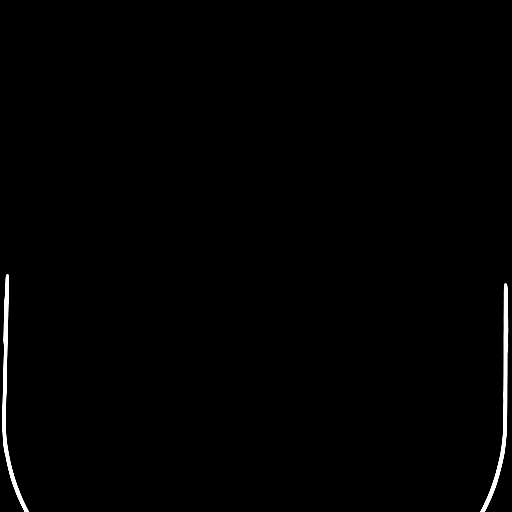

[16 of 30 positions shown; findings below may reference images not displayed]

FINDINGS: Mild cortical volume loss noted with proportional ventricular
prominence. Areas of periventricular white matter hypodensity are
most compatible with small vessel ischemic change. No acute
hemorrhage, infarct, or mass lesion is identified. Right maxillary
opacification is noted with mild ethmoid mucoperiosteal thickening.
No skull fracture.
IMPRESSION: No acute intracranial finding.  Chronic findings as above.

Right maxillary sinusitis.
